# Patient Record
Sex: Male | Born: 1935 | Race: Black or African American | Hispanic: No | Marital: Married | Smoking: Former smoker
Health system: Southern US, Community
[De-identification: ages and names within clinical notes are randomized; demographics above are authoritative.]

## PROBLEM LIST (undated history)

## (undated) DIAGNOSIS — G7000 Myasthenia gravis without (acute) exacerbation: Principal | ICD-10-CM

## (undated) DIAGNOSIS — M1990 Unspecified osteoarthritis, unspecified site: Secondary | ICD-10-CM

## (undated) DIAGNOSIS — I700 Atherosclerosis of aorta: Secondary | ICD-10-CM

## (undated) DIAGNOSIS — N1330 Unspecified hydronephrosis: Secondary | ICD-10-CM

## (undated) DIAGNOSIS — N182 Chronic kidney disease, stage 2 (mild): Secondary | ICD-10-CM

## (undated) DIAGNOSIS — R079 Chest pain, unspecified: Secondary | ICD-10-CM

## (undated) DIAGNOSIS — I723 Aneurysm of iliac artery: Secondary | ICD-10-CM

## (undated) DIAGNOSIS — R972 Elevated prostate specific antigen [PSA]: Secondary | ICD-10-CM

## (undated) DIAGNOSIS — I2699 Other pulmonary embolism without acute cor pulmonale: Secondary | ICD-10-CM

## (undated) DIAGNOSIS — K219 Gastro-esophageal reflux disease without esophagitis: Secondary | ICD-10-CM

## (undated) DIAGNOSIS — I10 Essential (primary) hypertension: Secondary | ICD-10-CM

## (undated) DIAGNOSIS — E119 Type 2 diabetes mellitus without complications: Secondary | ICD-10-CM

## (undated) DIAGNOSIS — I2510 Atherosclerotic heart disease of native coronary artery without angina pectoris: Secondary | ICD-10-CM

## (undated) DIAGNOSIS — N200 Calculus of kidney: Secondary | ICD-10-CM

## (undated) DIAGNOSIS — E785 Hyperlipidemia, unspecified: Secondary | ICD-10-CM

## (undated) HISTORY — DX: Hyperlipidemia, unspecified: E78.5

## (undated) HISTORY — PX: PROSTATE BIOPSY: SHX241

## (undated) HISTORY — DX: Type 2 diabetes mellitus without complications: E11.9

## (undated) HISTORY — DX: Unspecified hydronephrosis: N13.30

## (undated) HISTORY — DX: Myasthenia gravis without (acute) exacerbation: G70.00

## (undated) HISTORY — PX: KNEE SURGERY: SHX244

## (undated) HISTORY — DX: Chronic kidney disease, stage 2 (mild): N18.2

## (undated) HISTORY — DX: Unspecified osteoarthritis, unspecified site: M19.90

## (undated) HISTORY — PX: LITHOTRIPSY: SUR834

## (undated) HISTORY — PX: PROSTATE SURGERY: SHX751

---

## 2006-03-25 ENCOUNTER — Ambulatory Visit: Payer: Self-pay | Admitting: Internal Medicine

## 2006-04-09 ENCOUNTER — Encounter (INDEPENDENT_AMBULATORY_CARE_PROVIDER_SITE_OTHER): Payer: Self-pay | Admitting: *Deleted

## 2006-04-09 ENCOUNTER — Ambulatory Visit: Payer: Self-pay | Admitting: Internal Medicine

## 2006-12-06 ENCOUNTER — Encounter: Admission: RE | Admit: 2006-12-06 | Discharge: 2006-12-06 | Payer: Self-pay | Admitting: Internal Medicine

## 2007-04-30 ENCOUNTER — Emergency Department (HOSPITAL_COMMUNITY): Admission: EM | Admit: 2007-04-30 | Discharge: 2007-04-30 | Payer: Self-pay | Admitting: Emergency Medicine

## 2007-05-09 ENCOUNTER — Inpatient Hospital Stay (HOSPITAL_COMMUNITY): Admission: RE | Admit: 2007-05-09 | Discharge: 2007-05-11 | Payer: Self-pay | Admitting: Orthopedic Surgery

## 2007-06-01 ENCOUNTER — Ambulatory Visit (HOSPITAL_COMMUNITY): Admission: RE | Admit: 2007-06-01 | Discharge: 2007-06-01 | Payer: Self-pay | Admitting: Orthopedic Surgery

## 2007-06-10 ENCOUNTER — Encounter: Admission: RE | Admit: 2007-06-10 | Discharge: 2007-06-10 | Payer: Self-pay | Admitting: Orthopedic Surgery

## 2007-09-03 ENCOUNTER — Encounter: Admission: RE | Admit: 2007-09-03 | Discharge: 2007-09-03 | Payer: Self-pay | Admitting: Internal Medicine

## 2007-10-18 ENCOUNTER — Encounter: Admission: RE | Admit: 2007-10-18 | Discharge: 2007-10-18 | Payer: Self-pay | Admitting: *Deleted

## 2007-12-25 DIAGNOSIS — H499 Unspecified paralytic strabismus: Secondary | ICD-10-CM

## 2007-12-25 HISTORY — DX: Unspecified paralytic strabismus: H49.9

## 2008-03-24 ENCOUNTER — Emergency Department (HOSPITAL_COMMUNITY): Admission: EM | Admit: 2008-03-24 | Discharge: 2008-03-24 | Payer: Self-pay | Admitting: Emergency Medicine

## 2008-04-19 ENCOUNTER — Ambulatory Visit (HOSPITAL_COMMUNITY): Admission: RE | Admit: 2008-04-19 | Discharge: 2008-04-19 | Payer: Self-pay | Admitting: Urology

## 2009-03-15 ENCOUNTER — Encounter (INDEPENDENT_AMBULATORY_CARE_PROVIDER_SITE_OTHER): Payer: Self-pay | Admitting: *Deleted

## 2009-04-17 ENCOUNTER — Emergency Department (HOSPITAL_COMMUNITY): Admission: EM | Admit: 2009-04-17 | Discharge: 2009-04-17 | Payer: Self-pay | Admitting: Emergency Medicine

## 2009-05-20 ENCOUNTER — Encounter: Admission: RE | Admit: 2009-05-20 | Discharge: 2009-05-20 | Payer: Self-pay | Admitting: Orthopedic Surgery

## 2009-08-04 ENCOUNTER — Ambulatory Visit: Payer: Self-pay | Admitting: Internal Medicine

## 2009-08-16 ENCOUNTER — Encounter: Payer: Self-pay | Admitting: Internal Medicine

## 2009-08-16 ENCOUNTER — Ambulatory Visit: Payer: Self-pay | Admitting: Internal Medicine

## 2009-08-18 ENCOUNTER — Encounter: Payer: Self-pay | Admitting: Internal Medicine

## 2011-04-08 ENCOUNTER — Emergency Department (HOSPITAL_COMMUNITY): Payer: Medicare Other

## 2011-04-08 ENCOUNTER — Emergency Department (HOSPITAL_COMMUNITY)
Admission: EM | Admit: 2011-04-08 | Discharge: 2011-04-08 | Disposition: A | Discharge status: Home / Self Care | Payer: Medicare Other | Attending: Emergency Medicine | Admitting: Emergency Medicine

## 2011-04-08 DIAGNOSIS — R110 Nausea: Secondary | ICD-10-CM | POA: Insufficient documentation

## 2011-04-08 DIAGNOSIS — Z79899 Other long term (current) drug therapy: Secondary | ICD-10-CM | POA: Insufficient documentation

## 2011-04-08 DIAGNOSIS — Z7982 Long term (current) use of aspirin: Secondary | ICD-10-CM | POA: Insufficient documentation

## 2011-04-08 DIAGNOSIS — N201 Calculus of ureter: Secondary | ICD-10-CM | POA: Insufficient documentation

## 2011-04-08 DIAGNOSIS — E119 Type 2 diabetes mellitus without complications: Secondary | ICD-10-CM | POA: Insufficient documentation

## 2011-04-08 DIAGNOSIS — N200 Calculus of kidney: Secondary | ICD-10-CM | POA: Insufficient documentation

## 2011-04-08 DIAGNOSIS — R109 Unspecified abdominal pain: Secondary | ICD-10-CM | POA: Insufficient documentation

## 2011-04-08 LAB — CBC
HCT: 39.2 % (ref 39.0–52.0)
WBC: 8 10*3/uL (ref 4.0–10.5)

## 2011-04-08 LAB — DIFFERENTIAL
Basophils Absolute: 0 10*3/uL (ref 0.0–0.1)
Eosinophils Absolute: 0.1 10*3/uL (ref 0.0–0.7)
Eosinophils Relative: 1 % (ref 0–5)
Lymphs Abs: 0.9 10*3/uL (ref 0.7–4.0)
Monocytes Relative: 7 % (ref 3–12)
Neutrophils Relative %: 81 % — ABNORMAL HIGH (ref 43–77)

## 2011-04-08 LAB — URINE MICROSCOPIC-ADD ON

## 2011-04-08 LAB — BASIC METABOLIC PANEL
BUN: 8 mg/dL (ref 6–23)
Glucose, Bld: 161 mg/dL — ABNORMAL HIGH (ref 70–99)
Potassium: 4.2 mEq/L (ref 3.5–5.1)
Sodium: 141 mEq/L (ref 135–145)

## 2011-04-08 LAB — URINALYSIS, ROUTINE W REFLEX MICROSCOPIC
Glucose, UA: NEGATIVE mg/dL
Ketones, ur: NEGATIVE mg/dL
Protein, ur: 100 mg/dL — AB
Specific Gravity, Urine: 1.02 (ref 1.005–1.030)

## 2011-04-30 ENCOUNTER — Other Ambulatory Visit: Payer: Self-pay | Admitting: Urology

## 2011-04-30 ENCOUNTER — Ambulatory Visit
Admission: RE | Admit: 2011-04-30 | Discharge: 2011-04-30 | Disposition: A | Discharge status: Home / Self Care | Payer: Medicare Other | Source: Ambulatory Visit | Attending: Urology | Admitting: Urology

## 2011-04-30 DIAGNOSIS — N201 Calculus of ureter: Secondary | ICD-10-CM

## 2011-04-30 LAB — BASIC METABOLIC PANEL
CO2: 36 mEq/L — ABNORMAL HIGH (ref 19–32)
Chloride: 100 mEq/L (ref 96–112)
GFR calc Af Amer: >60 mL/min (ref >60)
Potassium: 4.4 mEq/L (ref 3.5–5.1)

## 2011-04-30 LAB — CBC
Hemoglobin: 12 g/dL — ABNORMAL LOW (ref 13.0–17.0)
MCV: 91 fL (ref 78.0–100.0)
Platelets: 274 10*3/uL (ref 150–400)
RBC: 4 MIL/uL — ABNORMAL LOW (ref 4.22–5.81)
WBC: 4 10*3/uL (ref 4.0–10.5)

## 2011-05-01 ENCOUNTER — Ambulatory Visit (HOSPITAL_BASED_OUTPATIENT_CLINIC_OR_DEPARTMENT_OTHER)
Admission: RE | Admit: 2011-05-01 | Discharge: 2011-05-01 | Disposition: A | Discharge status: Home / Self Care | Payer: Medicare Other | Source: Ambulatory Visit | Attending: Urology | Admitting: Urology

## 2011-05-01 DIAGNOSIS — Z79899 Other long term (current) drug therapy: Secondary | ICD-10-CM | POA: Insufficient documentation

## 2011-05-01 DIAGNOSIS — R9431 Abnormal electrocardiogram [ECG] [EKG]: Secondary | ICD-10-CM | POA: Insufficient documentation

## 2011-05-01 DIAGNOSIS — R7309 Other abnormal glucose: Secondary | ICD-10-CM | POA: Insufficient documentation

## 2011-05-01 DIAGNOSIS — R109 Unspecified abdominal pain: Secondary | ICD-10-CM | POA: Insufficient documentation

## 2011-05-01 DIAGNOSIS — I10 Essential (primary) hypertension: Secondary | ICD-10-CM | POA: Insufficient documentation

## 2011-05-01 DIAGNOSIS — N201 Calculus of ureter: Secondary | ICD-10-CM | POA: Insufficient documentation

## 2011-05-01 LAB — GLUCOSE, CAPILLARY: Glucose-Capillary: 109 mg/dL — ABNORMAL HIGH (ref 70–99)

## 2011-05-08 NOTE — Op Note (Signed)
NAMEMAKEL, MCMANN NO.:  [id number]   MEDICAL RECORD NO.:  [id number]          PATIENT TYPE:  INP   LOCATION:  1604                         FACILITY:  [facility]   PHYSICIAN:  Georges Lynch. Gioffre, M.D.DATE OF BIRTH:  10-Jan-1932   DATE OF PROCEDURE:  05/09/2007  DATE OF DISCHARGE:                               OPERATIVE REPORT   SURGEON:  Georges Lynch. Darrelyn Hillock, M.D.   ASSISTANT:  Arlyn Leak, P.A.   PREOPERATIVE DIAGNOSES:  1. Comminuted fracture of the right patella.  2. Questionable complete disruption of the patellar tendon.   POSTOPERATIVE DIAGNOSIS:  Comminuted fracture of the right patella.   OPERATION:  1. Open reduction and internal screw fixation of a comminuted fracture      of the right patella.  2. Restore tendon graft to the right patellar tendon.   PROCEDURE:  Under general anesthesia, routine orthopedic prep and  draping of the right lower extremity was carried out.  The patient had 1  g of IV Ancef.  At this time, the leg was exsanguinated with an Esmarch  and tourniquet was elevated at 375 mmHg.  An incision was made over the  anterior aspect of the right knee, bleeders identified and cauterized.  At this time, I then went down and examined the tendon.  The tendon was  abraded, but not ruptured.  He had multiple fractures.  I checked the  fracture site and I felt at this time the best thing to do to stabilize  him would be to do a screw fixation so I utilized a guidepin up through  the patella across the fracture sites and then utilized a fully threaded  screw for fixation after making my initial drill hole with a 3.8-mm  drill bit.  I utilized a 4.5-mm titanium DePuy screw with a washer for  fixation.  We had a solid fixation.  Fracture site was thoroughly  compressed and an x-ray was taken with the C-arm that showed good  position of the fracture fragments and the screw and washer.  We  irrigated the area out.  I injected 20 mL of 0.5% Marcaine  with  epinephrine in the knee joint and we then utilized a large Restore  tendon graft to reinforce the patellar tendon.  The wound then was  closed in layers in usual fashion.  Sterile dressings were applied.  The  patient was placed in a knee immobilizer.           ______________________________  Georges Lynch Darrelyn Hillock, M.D.     RAG/MEDQ  D:  05/09/2007  T:  05/10/2007  Job:  [id number]

## 2011-05-08 NOTE — H&P (Signed)
[name], [name] NO.:  [id number]   MEDICAL RECORD NO.:  [id number]         PATIENT TYPE:  LINP   LOCATION:                                FACILITY:  [facility]   PHYSICIAN:  Thomas Lynch. Gillespie, M.D.DATE OF BIRTH:  1932-04-29   DATE OF ADMISSION:  DATE OF DISCHARGE:  05/11/2007                              HISTORY & PHYSICAL   CHIEF COMPLAINT:  Right leg pain.   HISTORY OF PRESENT ILLNESS:  The patient is a 77 year old gentleman who  was at work on Apr 30, 2007, when he stumbled and tripped, fell forward,  grabbed something with his right arm, hyperextend his right arm, falling  and landing on his knee, directly on a large pipe, hyper-extending the  knee causing injury.  He had quite a bit of swelling in the knee.  He  went to the emergency room for evaluation.  X-rays of his shoulder were  normal.  X-rays of his hip were normal.  X-rays of his knee showed  multiple part comminuted fracture of his patella.  The patient also had  a blastic-appearing lesion in his right lower facet joint, which  recommended further evaluation.  The patient is here today for  evaluation of his right knee.   On review of the patient's current situation.  He has a multi-part  fractured patella and Dr. Darrelyn Gillespie recommends that we surgically do a  partial patellectomy with reattachment of the patella tendon for this  issue.  The patient would like to proceed.   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   CURRENT MEDICATIONS:  1. Prinivil 10 mg a day.  2. Nexium p.r.n.  3. Claritin-D p.r.n.   ALLERGIES:  1. ASPIRIN.  2. IV DYE.   PAST SURGICAL HISTORY:  Dilatation of prostate canal.  The patient  denies any complications.   SOCIAL HISTORY:  The patient is married, lives with his wife.  He  currently works at [employer].  He does not smoke or use alcohol.   PRIMARY CARE PHYSICIAN:  [name], M.D.   REVIEW OF SYSTEMS:  Negative for any recent fevers,  chills, colds, or  any systemic complaints.  He denies any cardiac issues, no respiratory  issues, no GI/GU issues.   PHYSICAL EXAMINATION:  GENERAL:  The patient is a healthy-appearing  gentleman conscious, alert, and appropriate.  VITAL SIGNS:  He is 5 feet 11 inches, 195 pounds.  The patient's blood  pressure is 160/82, pulse is 70 and regular. The patient is afebrile.  HEENT:  Head is normocephalic.  Pupils equal, round, reactive.  Gross  hearing is intact.  NECK:  Supple.  He had good range of motion of the cervical spine  without any difficulty.  CHEST:  Lung sounds were clear and equal bilaterally.  No wheezes,  rales, rhonchi.  HEART:  Regular rate and rhythm.  No murmurs, rubs or gallops.  ABDOMEN:  Soft, nontender.  Bowel sounds present.  EXTREMITIES:  Upper extremities:  He had good range of motion of both  shoulders today without any difficulty, good range of his  elbows and  wrists.  Lower Extremities:  Right and left hips had full extension,  flexion up to the 120 degrees.  Right knee had significant effusion and  ecchymosis.  He was unable to straight leg raise due to discomfort.  His  distal foot is neurologically intact.  His left lower extremity is  normal-appearing.  BREAST/RECTAL/GU:  Deferred at this time.   IMPRESSION:  1. Comminuted fracture of right patella.  2. Borderline diabetes   PLAN:  The patient will undergo all routine labs and tests prior to  having a right knee partial patellectomy with reattachment of his  patella tendon.  We did contact Dr. Dierdre Gillespie office for  preoperative clearance and he indicated that the patient was fine to  proceed with this surgical procedure.      [name], P.A.    ______________________________  [name], M.D.    RWK/MEDQ  D:  05/01/2007  T:  05/01/2007  Job:  [id number]

## 2011-05-10 NOTE — Op Note (Signed)
[name], [name] NO.:  [id number]  MEDICAL RECORD NO.:  [id number]           PATIENT TYPE:  LOCATION:                                 FACILITY:  PHYSICIAN:  Courtney Paris, M.D.DATE OF BIRTH:  Jan 28, 1932  DATE OF PROCEDURE:  05/01/2011 DATE OF DISCHARGE:                              OPERATIVE REPORT   PREOPERATIVE DIAGNOSIS:  Obstructing left distal ureteral stone.  POSTOPERATIVE DIAGNOSIS:  Obstructing left distal ureteral stone.  OPERATION: 1. Cystoscopy. 2. Retrograde pyelogram, left. 3. Left ureteroscopy with holmium lasertripsy. 4. Insertion of left ureteral stent.  ANESTHESIA:  General.  SURGEON:  Courtney Paris, M.D.  BRIEF HISTORY:  This 77 year old black male was admitted with a left distal stone that was first discovered on a CT scan when he went to the emergency room on April 08, 2011, with acute left-sided pain.  He had had previous stone treated by lithotripsy by Dr. Earlene Plater in 2009.  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  He enters now for removal of his obstructing stone by ureteroscopy.  DESCRIPTION OF PROCEDURE:  The patient was placed on the operating table in dorsal lithotomy position.  After satisfactory induction of general anesthesia, was given IV Cipro, prepped and draped with Betadine in the usual sterile fashion.  Time-out was then performed and the patient and procedure then reconfirmed.  A 21 panendoscope was inserted.  The anterior urethra was normal.  Posterior urethra showed mild trilobar BPH and the bladder was entered.  The bladder was 2+ trabeculated, but had no bladder mucosal lesions.  The orifices were little difficult to see due to the large intravesical protrusion of the median lobe.  With a guidewire and the 5 open-ended ureteral catheter, I was able to catheterize the left ureteral orifice, removed the guidewire, then performed an occlusive retrograde pyelogram.  This demonstrated the stone  in the distal ureter with only mild dilation proximal to this. Guidewire was then passed under fluoroscopy up to the level of the kidney and the open-ended catheter was then removed.  The scope was removed and under fluoroscopy using the ureteral access sheath and cannula, I dilated the distal ureter up to the stone leaving the guidewire in place.  The 6 short ureteroscope was then passed under direct vision up to the stone, which was quite large and photographed.  The holmium laser was then calibrated at 0.5 J at 10 per second repetition and under direct vision, I was able to break the stone into about 3 to 4 pieces.  I was able to get the scope past this.  It took about 3 to 4 passes with the loop basket to remove all the pieces, but when these were gone x-ray confirmed there were no other stone fragments.  Over the guidewire, a 6- French x 26 cm length double-J ureteral stent was passed over the guidewire leaving the string on the distal end.  This was brought out through the penis and taped to the penis for later removal. The patient was given some Uro-jet lidocaine jelly anesthesia in the urethra and a B and O suppository.  He was then taken to the recovery room in good condition and will be later discharged as an outpatient. We will remove the stent in 2 days.     Courtney Paris, M.D.     HMK/MEDQ  D:  05/01/2011  T:  05/01/2011  Job:  [id number]  Electronically Signed by Vic Blackbird M.D. on 05/10/2011 05:20:28 PM

## 2011-09-18 LAB — DIFFERENTIAL
Basophils Relative: 1
Eosinophils Absolute: 0.1
Eosinophils Relative: 1
Monocytes Absolute: 0.3
Monocytes Relative: 4

## 2011-09-18 LAB — URINALYSIS, ROUTINE W REFLEX MICROSCOPIC
Bilirubin Urine: NEGATIVE
Glucose, UA: NEGATIVE
Ketones, ur: 15 — AB
Protein, ur: 100 — AB
Urobilinogen, UA: 1

## 2011-09-18 LAB — CBC
Hemoglobin: 13
Platelets: 247
RDW: 14.7

## 2011-09-18 LAB — COMPREHENSIVE METABOLIC PANEL
ALT: 15
AST: 21
Albumin: 3.8
Alkaline Phosphatase: 85
GFR calc Af Amer: 60 — ABNORMAL LOW
Glucose, Bld: 124 — ABNORMAL HIGH
Potassium: 3.6
Sodium: 138
Total Protein: 7

## 2011-09-18 LAB — URINE MICROSCOPIC-ADD ON

## 2012-02-28 DIAGNOSIS — G733 Myasthenic syndromes in other diseases classified elsewhere: Secondary | ICD-10-CM | POA: Diagnosis not present

## 2012-02-28 DIAGNOSIS — H532 Diplopia: Secondary | ICD-10-CM | POA: Diagnosis not present

## 2012-03-13 DIAGNOSIS — E119 Type 2 diabetes mellitus without complications: Secondary | ICD-10-CM | POA: Diagnosis not present

## 2012-03-13 DIAGNOSIS — E785 Hyperlipidemia, unspecified: Secondary | ICD-10-CM | POA: Diagnosis not present

## 2012-03-13 DIAGNOSIS — I10 Essential (primary) hypertension: Secondary | ICD-10-CM | POA: Diagnosis not present

## 2012-03-13 DIAGNOSIS — Z125 Encounter for screening for malignant neoplasm of prostate: Secondary | ICD-10-CM | POA: Diagnosis not present

## 2012-03-20 DIAGNOSIS — Z125 Encounter for screening for malignant neoplasm of prostate: Secondary | ICD-10-CM | POA: Diagnosis not present

## 2012-03-20 DIAGNOSIS — I10 Essential (primary) hypertension: Secondary | ICD-10-CM | POA: Diagnosis not present

## 2012-03-20 DIAGNOSIS — E119 Type 2 diabetes mellitus without complications: Secondary | ICD-10-CM | POA: Diagnosis not present

## 2012-03-20 DIAGNOSIS — E785 Hyperlipidemia, unspecified: Secondary | ICD-10-CM | POA: Diagnosis not present

## 2012-03-20 DIAGNOSIS — Z0000 Encounter for general adult medical examination without abnormal findings: Secondary | ICD-10-CM | POA: Diagnosis not present

## 2012-03-27 DIAGNOSIS — Z1212 Encounter for screening for malignant neoplasm of rectum: Secondary | ICD-10-CM | POA: Diagnosis not present

## 2012-06-02 DIAGNOSIS — N400 Enlarged prostate without lower urinary tract symptoms: Secondary | ICD-10-CM | POA: Diagnosis not present

## 2012-07-31 DIAGNOSIS — M1990 Unspecified osteoarthritis, unspecified site: Secondary | ICD-10-CM | POA: Diagnosis not present

## 2012-07-31 DIAGNOSIS — E119 Type 2 diabetes mellitus without complications: Secondary | ICD-10-CM | POA: Diagnosis not present

## 2012-07-31 DIAGNOSIS — I10 Essential (primary) hypertension: Secondary | ICD-10-CM | POA: Diagnosis not present

## 2012-07-31 DIAGNOSIS — E785 Hyperlipidemia, unspecified: Secondary | ICD-10-CM | POA: Diagnosis not present

## 2012-09-26 DIAGNOSIS — Z23 Encounter for immunization: Secondary | ICD-10-CM | POA: Diagnosis not present

## 2012-09-26 DIAGNOSIS — J069 Acute upper respiratory infection, unspecified: Secondary | ICD-10-CM | POA: Diagnosis not present

## 2012-09-26 DIAGNOSIS — I10 Essential (primary) hypertension: Secondary | ICD-10-CM | POA: Diagnosis not present

## 2012-09-26 DIAGNOSIS — E119 Type 2 diabetes mellitus without complications: Secondary | ICD-10-CM | POA: Diagnosis not present

## 2012-12-04 DIAGNOSIS — E785 Hyperlipidemia, unspecified: Secondary | ICD-10-CM | POA: Diagnosis not present

## 2012-12-04 DIAGNOSIS — Z1331 Encounter for screening for depression: Secondary | ICD-10-CM | POA: Diagnosis not present

## 2012-12-04 DIAGNOSIS — E119 Type 2 diabetes mellitus without complications: Secondary | ICD-10-CM | POA: Diagnosis not present

## 2012-12-04 DIAGNOSIS — I10 Essential (primary) hypertension: Secondary | ICD-10-CM | POA: Diagnosis not present

## 2013-04-09 DIAGNOSIS — E1129 Type 2 diabetes mellitus with other diabetic kidney complication: Secondary | ICD-10-CM | POA: Diagnosis not present

## 2013-04-09 DIAGNOSIS — Z125 Encounter for screening for malignant neoplasm of prostate: Secondary | ICD-10-CM | POA: Diagnosis not present

## 2013-04-09 DIAGNOSIS — I10 Essential (primary) hypertension: Secondary | ICD-10-CM | POA: Diagnosis not present

## 2013-04-09 DIAGNOSIS — E785 Hyperlipidemia, unspecified: Secondary | ICD-10-CM | POA: Diagnosis not present

## 2013-04-15 DIAGNOSIS — M1990 Unspecified osteoarthritis, unspecified site: Secondary | ICD-10-CM | POA: Diagnosis not present

## 2013-04-15 DIAGNOSIS — Z0000 Encounter for general adult medical examination without abnormal findings: Secondary | ICD-10-CM | POA: Diagnosis not present

## 2013-04-15 DIAGNOSIS — I10 Essential (primary) hypertension: Secondary | ICD-10-CM | POA: Diagnosis not present

## 2013-04-15 DIAGNOSIS — Z6828 Body mass index (BMI) 28.0-28.9, adult: Secondary | ICD-10-CM | POA: Diagnosis not present

## 2013-04-15 DIAGNOSIS — Z125 Encounter for screening for malignant neoplasm of prostate: Secondary | ICD-10-CM | POA: Diagnosis not present

## 2013-04-15 DIAGNOSIS — E1129 Type 2 diabetes mellitus with other diabetic kidney complication: Secondary | ICD-10-CM | POA: Diagnosis not present

## 2013-04-15 DIAGNOSIS — E785 Hyperlipidemia, unspecified: Secondary | ICD-10-CM | POA: Diagnosis not present

## 2013-04-15 DIAGNOSIS — N182 Chronic kidney disease, stage 2 (mild): Secondary | ICD-10-CM | POA: Diagnosis not present

## 2013-04-17 DIAGNOSIS — Z1212 Encounter for screening for malignant neoplasm of rectum: Secondary | ICD-10-CM | POA: Diagnosis not present

## 2013-05-05 DIAGNOSIS — L723 Sebaceous cyst: Secondary | ICD-10-CM | POA: Diagnosis not present

## 2013-08-16 ENCOUNTER — Emergency Department (HOSPITAL_COMMUNITY): Payer: Medicare Other

## 2013-08-16 ENCOUNTER — Emergency Department (HOSPITAL_COMMUNITY)
Admission: EM | Admit: 2013-08-16 | Discharge: 2013-08-16 | Disposition: A | Discharge status: Home / Self Care | Payer: Medicare Other | Attending: Emergency Medicine | Admitting: Emergency Medicine

## 2013-08-16 ENCOUNTER — Encounter (HOSPITAL_COMMUNITY): Payer: Self-pay | Admitting: *Deleted

## 2013-08-16 DIAGNOSIS — R52 Pain, unspecified: Secondary | ICD-10-CM | POA: Insufficient documentation

## 2013-08-16 DIAGNOSIS — Z87442 Personal history of urinary calculi: Secondary | ICD-10-CM | POA: Insufficient documentation

## 2013-08-16 DIAGNOSIS — Z87891 Personal history of nicotine dependence: Secondary | ICD-10-CM | POA: Diagnosis not present

## 2013-08-16 DIAGNOSIS — Z79899 Other long term (current) drug therapy: Secondary | ICD-10-CM | POA: Diagnosis not present

## 2013-08-16 DIAGNOSIS — M47817 Spondylosis without myelopathy or radiculopathy, lumbosacral region: Secondary | ICD-10-CM | POA: Diagnosis not present

## 2013-08-16 DIAGNOSIS — M25559 Pain in unspecified hip: Secondary | ICD-10-CM | POA: Diagnosis not present

## 2013-08-16 DIAGNOSIS — Z7982 Long term (current) use of aspirin: Secondary | ICD-10-CM | POA: Diagnosis not present

## 2013-08-16 DIAGNOSIS — M169 Osteoarthritis of hip, unspecified: Secondary | ICD-10-CM | POA: Diagnosis not present

## 2013-08-16 DIAGNOSIS — M5126 Other intervertebral disc displacement, lumbar region: Secondary | ICD-10-CM | POA: Diagnosis not present

## 2013-08-16 DIAGNOSIS — I10 Essential (primary) hypertension: Secondary | ICD-10-CM | POA: Insufficient documentation

## 2013-08-16 DIAGNOSIS — M25552 Pain in left hip: Secondary | ICD-10-CM

## 2013-08-16 DIAGNOSIS — R209 Unspecified disturbances of skin sensation: Secondary | ICD-10-CM | POA: Insufficient documentation

## 2013-08-16 HISTORY — DX: Calculus of kidney: N20.0

## 2013-08-16 HISTORY — DX: Essential (primary) hypertension: I10

## 2013-08-16 MED ORDER — TRAMADOL HCL 50 MG PO TABS: 50.0000 mg | ORAL_TABLET | Freq: Four times a day (QID) | ORAL | Status: DC | PRN

## 2013-08-16 NOTE — ED Notes (Signed)
Pt states that his left hip has been causing him pain for the last few months. Pt states that he usually have intermittant pain and it usually goes away but this time the pain in his left hip has lasted 3 days. Pt states feels like bone on bone pain, sharp. Pt ambulatory.

## 2013-08-16 NOTE — ED Provider Notes (Signed)
CSN: [id number]     Arrival date & time 08/16/13  1944 History  This chart was scribed for non-physician practitioner Antony Madura, PA-C working with Shanna Cisco, MD by Danella Maiers, ED Scribe. This patient was seen in room TR05C/TR05C and the patient's care was started at 9:18 PM.    Chief Complaint  Patient presents with  . Hip Pain    Patient is a 79 y.o. male presenting with hip pain. The history is provided by the patient. No language interpreter was used.  Hip Pain This is a new problem. The current episode started more than 1 week ago. The problem occurs daily. The problem has been gradually worsening. Exacerbated by: prolonged rest. The symptoms are relieved by narcotics.   HPI Comments: [name] is a 79 y.o. male who presents to the Emergency Department complaining of sharp, constant, left sided hip pain that radiates down the back of his left leg onset two weeks ago. Pt reports the pain is worse after prolonged periods of rest, especially upon waking in the morning. He states the pain is less during the day while he is moving around. Pt denies recently falling or injuring his hip.  He has felt associated tingling in his left foot. He has tried one oxycodone leftover from a knee surgery that helped the pain and he was able to sleep. He denies numbness in his legs, weakness, trouble ambulating, numbness of the genitals, bladder and bowel incontinence.    Past Medical History  Diagnosis Date  . Kidney stone   . Hypertension    Past Surgical History  Procedure Laterality Date  . Prostate surgery    . Knee surgery     History reviewed. No pertinent family history. History  Substance Use Topics  . Smoking status: Former Games developer  . Smokeless tobacco: Not on file  . Alcohol Use: No    Review of Systems  Constitutional: Negative for fever.  Musculoskeletal: Positive for arthralgias (left hip pain). Negative for gait problem.  Skin: Negative for pallor.  Neurological:  Negative for weakness and numbness.  All other systems reviewed and are negative.    Allergies  Aspirin  Home Medications   Current Outpatient Rx  Name  Route  Sig  Dispense  Refill  . aspirin EC 81 MG tablet   Oral   Take 81 mg by mouth daily.         Marland Kitchen lisinopril (PRINIVIL,ZESTRIL) 10 MG tablet   Oral   Take 10 mg by mouth daily.         . pravastatin (PRAVACHOL) 20 MG tablet   Oral   Take 20 mg by mouth daily.         . traMADol (ULTRAM) 50 MG tablet   Oral   Take 1 tablet (50 mg total) by mouth every 6 (six) hours as needed for pain.   15 tablet   0    BP 186/98  Pulse 91  Temp(Src) 99.9 F (37.7 C) (Oral)  Resp 16  SpO2 96% Physical Exam  Nursing note and vitals reviewed. Constitutional: He is oriented to person, place, and time. He appears well-developed and well-nourished. No distress.  HENT:  Head: Normocephalic and atraumatic.  Eyes: Conjunctivae and EOM are normal. No scleral icterus.  Neck: Normal range of motion.  Cardiovascular: Normal rate, regular rhythm and intact distal pulses.   Pulses:      Dorsalis pedis pulses are 2+ on the right side, and 2+ on the left side.  Posterior tibial pulses are 2+ on the right side, and 2+ on the left side.  TPN and PT pulses 2+. Capillary refill normal.  Pulmonary/Chest: Effort normal. No respiratory distress.  Musculoskeletal: Normal range of motion.  No pain with internal and external rotation of left hip joint. Range of motion of left hip joint normal. No bony tenderness.  Neurological: He is alert and oriented to person, place, and time.  DTRs normal and symmetric. No sensory or motor deficits appreciated. Patient ambulatory with normal gait.  Skin: Skin is warm and dry. No rash noted. He is not diaphoretic. No erythema. No pallor.  Psychiatric: He has a normal mood and affect. His behavior is normal.    ED Course  Medications - No data to display  DIAGNOSTIC STUDIES: Oxygen Saturation is  96% on room air, normal by my interpretation.    COORDINATION OF CARE: 9:29 PM- Discussed treatment plan with pt which includes a left hip x-ray and pt agrees to plan.  Procedures (including critical care time)  Labs Reviewed - No data to display Dg Lumbar Spine Complete  08/16/2013   *RADIOLOGY REPORT*  Clinical Data: Chronic hip pain  LUMBAR SPINE - COMPLETE 4+ VIEW  Comparison: 05/03/2011 abdominal radiograph  Findings: Mild multilevel degenerative changes of the lumbar spine including anterior osteophyte formation and facet arthropathy, most pronounced within the lower lumbar/lumbosacral junction. Minimal anterior wedging of L1, similar to prior.  Rounded calcifications within the left paramidline may reflect calcified lymph nodes. Overlying soft tissues otherwise unremarkable.  IMPRESSION: Mild multilevel lumbar spine degenerative change.   Original Report Authenticated By: Jearld Lesch, M.D.   Dg Hip Complete Left  08/16/2013   *RADIOLOGY REPORT*  Clinical Data: Chronic left hip pain  LEFT HIP - COMPLETE 2+ VIEW  Comparison: 12/06/2006  Findings: No displaced fracture.  No dislocation.  No aggressive osseous lesions. Mild bilateral hip degenerative changes. Overlying soft tissues unremarkable.  IMPRESSION: No acute osseous finding left hip.  Mild bilateral hip DJD.   Original Report Authenticated By: Jearld Lesch, M.D.   1. Hip pain, acute, left    MDM  79 year old male who presents for left hip pain. Patient is neurovascularly intact and ambulatory. No sensory or motor deficits appreciated. Strength against resistance normal and left lower extremity. Patient denies falls, trauma, or injury to his left hip. DG lumbar spine and left hip without acute bony abnormalities or evidence of fracture. Symptoms are consistent with hip pain secondary to arthritic changes. Patient endorses establish relationship with orthopedist and is reliable for followup. He is appropriate for discharge with  prescription for tramadol to take as needed for severe pain. Aleve recommended for mild to moderate pain as well as ice to the affected area. Indications for ED return provided and patient agreeable to plan. Patient seen also by Dr. Toy Cookey who is in agreement with this workup, assessment, management plan, and patient's stability for discharge.  I personally performed the services described in this documentation, which was scribed in my presence. The recorded information has been reviewed and is accurate.     Antony Madura, PA-C 08/16/13 2304

## 2013-08-17 NOTE — ED Provider Notes (Signed)
Medical screening examination/treatment/procedure(s) were conducted as a shared visit with non-physician practitioner(s) and myself.  I personally evaluated the patient during the encounter  Pt is a 79 y.o. male with chronic L hip pain, worse in morning and after periods of immobilization.  No infectious s/sx.  Pt well appearing on exam, no ttp over hip, NVI distally.  XR c/w degenerative changes.  Pt will try Aleve for pain, will f/u with his orthopedist.    Shanna Cisco, MD 08/17/13 1153

## 2013-08-21 DIAGNOSIS — Z23 Encounter for immunization: Secondary | ICD-10-CM | POA: Diagnosis not present

## 2013-08-21 DIAGNOSIS — M1990 Unspecified osteoarthritis, unspecified site: Secondary | ICD-10-CM | POA: Diagnosis not present

## 2013-08-21 DIAGNOSIS — I10 Essential (primary) hypertension: Secondary | ICD-10-CM | POA: Diagnosis not present

## 2013-08-21 DIAGNOSIS — E1129 Type 2 diabetes mellitus with other diabetic kidney complication: Secondary | ICD-10-CM | POA: Diagnosis not present

## 2013-08-21 DIAGNOSIS — M25559 Pain in unspecified hip: Secondary | ICD-10-CM | POA: Diagnosis not present

## 2013-08-28 DIAGNOSIS — M25559 Pain in unspecified hip: Secondary | ICD-10-CM | POA: Diagnosis not present

## 2013-09-01 DIAGNOSIS — M25559 Pain in unspecified hip: Secondary | ICD-10-CM | POA: Diagnosis not present

## 2013-09-03 DIAGNOSIS — M25569 Pain in unspecified knee: Secondary | ICD-10-CM | POA: Diagnosis not present

## 2013-09-08 DIAGNOSIS — M25569 Pain in unspecified knee: Secondary | ICD-10-CM | POA: Diagnosis not present

## 2013-09-14 DIAGNOSIS — M25569 Pain in unspecified knee: Secondary | ICD-10-CM | POA: Diagnosis not present

## 2013-09-21 DIAGNOSIS — M25569 Pain in unspecified knee: Secondary | ICD-10-CM | POA: Diagnosis not present

## 2013-09-28 DIAGNOSIS — M5430 Sciatica, unspecified side: Secondary | ICD-10-CM | POA: Diagnosis not present

## 2013-09-28 DIAGNOSIS — M25559 Pain in unspecified hip: Secondary | ICD-10-CM | POA: Diagnosis not present

## 2014-01-04 DIAGNOSIS — M1990 Unspecified osteoarthritis, unspecified site: Secondary | ICD-10-CM | POA: Diagnosis not present

## 2014-01-04 DIAGNOSIS — Z1331 Encounter for screening for depression: Secondary | ICD-10-CM | POA: Diagnosis not present

## 2014-01-04 DIAGNOSIS — I10 Essential (primary) hypertension: Secondary | ICD-10-CM | POA: Diagnosis not present

## 2014-01-04 DIAGNOSIS — Z6827 Body mass index (BMI) 27.0-27.9, adult: Secondary | ICD-10-CM | POA: Diagnosis not present

## 2014-01-04 DIAGNOSIS — E1129 Type 2 diabetes mellitus with other diabetic kidney complication: Secondary | ICD-10-CM | POA: Diagnosis not present

## 2014-01-04 DIAGNOSIS — E785 Hyperlipidemia, unspecified: Secondary | ICD-10-CM | POA: Diagnosis not present

## 2014-04-27 DIAGNOSIS — E785 Hyperlipidemia, unspecified: Secondary | ICD-10-CM | POA: Diagnosis not present

## 2014-04-27 DIAGNOSIS — E1129 Type 2 diabetes mellitus with other diabetic kidney complication: Secondary | ICD-10-CM | POA: Diagnosis not present

## 2014-04-27 DIAGNOSIS — I10 Essential (primary) hypertension: Secondary | ICD-10-CM | POA: Diagnosis not present

## 2014-04-27 DIAGNOSIS — Z125 Encounter for screening for malignant neoplasm of prostate: Secondary | ICD-10-CM | POA: Diagnosis not present

## 2014-05-05 DIAGNOSIS — E785 Hyperlipidemia, unspecified: Secondary | ICD-10-CM | POA: Diagnosis not present

## 2014-05-05 DIAGNOSIS — I10 Essential (primary) hypertension: Secondary | ICD-10-CM | POA: Diagnosis not present

## 2014-05-05 DIAGNOSIS — Z6828 Body mass index (BMI) 28.0-28.9, adult: Secondary | ICD-10-CM | POA: Diagnosis not present

## 2014-05-05 DIAGNOSIS — Z23 Encounter for immunization: Secondary | ICD-10-CM | POA: Diagnosis not present

## 2014-05-05 DIAGNOSIS — Z0000 Encounter for general adult medical examination without abnormal findings: Secondary | ICD-10-CM | POA: Diagnosis not present

## 2014-05-05 DIAGNOSIS — E1129 Type 2 diabetes mellitus with other diabetic kidney complication: Secondary | ICD-10-CM | POA: Diagnosis not present

## 2014-05-05 DIAGNOSIS — M1990 Unspecified osteoarthritis, unspecified site: Secondary | ICD-10-CM | POA: Diagnosis not present

## 2014-05-05 DIAGNOSIS — Z125 Encounter for screening for malignant neoplasm of prostate: Secondary | ICD-10-CM | POA: Diagnosis not present

## 2014-11-05 DIAGNOSIS — E785 Hyperlipidemia, unspecified: Secondary | ICD-10-CM | POA: Diagnosis not present

## 2014-11-05 DIAGNOSIS — Z6828 Body mass index (BMI) 28.0-28.9, adult: Secondary | ICD-10-CM | POA: Diagnosis not present

## 2014-11-05 DIAGNOSIS — I10 Essential (primary) hypertension: Secondary | ICD-10-CM | POA: Diagnosis not present

## 2014-11-05 DIAGNOSIS — E1129 Type 2 diabetes mellitus with other diabetic kidney complication: Secondary | ICD-10-CM | POA: Diagnosis not present

## 2014-11-05 DIAGNOSIS — Z23 Encounter for immunization: Secondary | ICD-10-CM | POA: Diagnosis not present

## 2014-12-08 DIAGNOSIS — Z6827 Body mass index (BMI) 27.0-27.9, adult: Secondary | ICD-10-CM | POA: Diagnosis not present

## 2014-12-08 DIAGNOSIS — E1129 Type 2 diabetes mellitus with other diabetic kidney complication: Secondary | ICD-10-CM | POA: Diagnosis not present

## 2015-05-04 DIAGNOSIS — E785 Hyperlipidemia, unspecified: Secondary | ICD-10-CM | POA: Diagnosis not present

## 2015-05-04 DIAGNOSIS — I10 Essential (primary) hypertension: Secondary | ICD-10-CM | POA: Diagnosis not present

## 2015-05-04 DIAGNOSIS — Z125 Encounter for screening for malignant neoplasm of prostate: Secondary | ICD-10-CM | POA: Diagnosis not present

## 2015-05-04 DIAGNOSIS — E1129 Type 2 diabetes mellitus with other diabetic kidney complication: Secondary | ICD-10-CM | POA: Diagnosis not present

## 2015-05-11 DIAGNOSIS — N182 Chronic kidney disease, stage 2 (mild): Secondary | ICD-10-CM | POA: Diagnosis not present

## 2015-05-11 DIAGNOSIS — Z0000 Encounter for general adult medical examination without abnormal findings: Secondary | ICD-10-CM | POA: Diagnosis not present

## 2015-05-11 DIAGNOSIS — Z6827 Body mass index (BMI) 27.0-27.9, adult: Secondary | ICD-10-CM | POA: Diagnosis not present

## 2015-05-11 DIAGNOSIS — E785 Hyperlipidemia, unspecified: Secondary | ICD-10-CM | POA: Diagnosis not present

## 2015-05-11 DIAGNOSIS — M1990 Unspecified osteoarthritis, unspecified site: Secondary | ICD-10-CM | POA: Diagnosis not present

## 2015-05-11 DIAGNOSIS — Z1389 Encounter for screening for other disorder: Secondary | ICD-10-CM | POA: Diagnosis not present

## 2015-05-11 DIAGNOSIS — E1129 Type 2 diabetes mellitus with other diabetic kidney complication: Secondary | ICD-10-CM | POA: Diagnosis not present

## 2015-05-11 DIAGNOSIS — I129 Hypertensive chronic kidney disease with stage 1 through stage 4 chronic kidney disease, or unspecified chronic kidney disease: Secondary | ICD-10-CM | POA: Diagnosis not present

## 2015-05-11 DIAGNOSIS — I10 Essential (primary) hypertension: Secondary | ICD-10-CM | POA: Diagnosis not present

## 2015-05-16 DIAGNOSIS — Z125 Encounter for screening for malignant neoplasm of prostate: Secondary | ICD-10-CM | POA: Diagnosis not present

## 2015-09-16 DIAGNOSIS — E1129 Type 2 diabetes mellitus with other diabetic kidney complication: Secondary | ICD-10-CM | POA: Diagnosis not present

## 2015-09-16 DIAGNOSIS — M1990 Unspecified osteoarthritis, unspecified site: Secondary | ICD-10-CM | POA: Diagnosis not present

## 2015-09-16 DIAGNOSIS — E1140 Type 2 diabetes mellitus with diabetic neuropathy, unspecified: Secondary | ICD-10-CM | POA: Diagnosis not present

## 2015-09-16 DIAGNOSIS — N182 Chronic kidney disease, stage 2 (mild): Secondary | ICD-10-CM | POA: Diagnosis not present

## 2015-09-16 DIAGNOSIS — I700 Atherosclerosis of aorta: Secondary | ICD-10-CM | POA: Diagnosis not present

## 2015-09-16 DIAGNOSIS — G609 Hereditary and idiopathic neuropathy, unspecified: Secondary | ICD-10-CM | POA: Diagnosis not present

## 2015-09-16 DIAGNOSIS — E1151 Type 2 diabetes mellitus with diabetic peripheral angiopathy without gangrene: Secondary | ICD-10-CM | POA: Diagnosis not present

## 2015-09-16 DIAGNOSIS — I129 Hypertensive chronic kidney disease with stage 1 through stage 4 chronic kidney disease, or unspecified chronic kidney disease: Secondary | ICD-10-CM | POA: Diagnosis not present

## 2015-09-16 DIAGNOSIS — Z6827 Body mass index (BMI) 27.0-27.9, adult: Secondary | ICD-10-CM | POA: Diagnosis not present

## 2015-09-16 DIAGNOSIS — Z23 Encounter for immunization: Secondary | ICD-10-CM | POA: Diagnosis not present

## 2015-09-16 DIAGNOSIS — I10 Essential (primary) hypertension: Secondary | ICD-10-CM | POA: Diagnosis not present

## 2015-09-16 DIAGNOSIS — E785 Hyperlipidemia, unspecified: Secondary | ICD-10-CM | POA: Diagnosis not present

## 2016-02-09 DIAGNOSIS — N182 Chronic kidney disease, stage 2 (mild): Secondary | ICD-10-CM | POA: Diagnosis not present

## 2016-02-09 DIAGNOSIS — G609 Hereditary and idiopathic neuropathy, unspecified: Secondary | ICD-10-CM | POA: Diagnosis not present

## 2016-02-09 DIAGNOSIS — Z1389 Encounter for screening for other disorder: Secondary | ICD-10-CM | POA: Diagnosis not present

## 2016-02-09 DIAGNOSIS — M1990 Unspecified osteoarthritis, unspecified site: Secondary | ICD-10-CM | POA: Diagnosis not present

## 2016-02-09 DIAGNOSIS — E1140 Type 2 diabetes mellitus with diabetic neuropathy, unspecified: Secondary | ICD-10-CM | POA: Diagnosis not present

## 2016-02-09 DIAGNOSIS — I129 Hypertensive chronic kidney disease with stage 1 through stage 4 chronic kidney disease, or unspecified chronic kidney disease: Secondary | ICD-10-CM | POA: Diagnosis not present

## 2016-02-09 DIAGNOSIS — E784 Other hyperlipidemia: Secondary | ICD-10-CM | POA: Diagnosis not present

## 2016-02-09 DIAGNOSIS — E1151 Type 2 diabetes mellitus with diabetic peripheral angiopathy without gangrene: Secondary | ICD-10-CM | POA: Diagnosis not present

## 2016-02-09 DIAGNOSIS — I10 Essential (primary) hypertension: Secondary | ICD-10-CM | POA: Diagnosis not present

## 2016-02-09 DIAGNOSIS — I700 Atherosclerosis of aorta: Secondary | ICD-10-CM | POA: Diagnosis not present

## 2016-05-16 DIAGNOSIS — E784 Other hyperlipidemia: Secondary | ICD-10-CM | POA: Diagnosis not present

## 2016-05-16 DIAGNOSIS — I10 Essential (primary) hypertension: Secondary | ICD-10-CM | POA: Diagnosis not present

## 2016-05-16 DIAGNOSIS — Z125 Encounter for screening for malignant neoplasm of prostate: Secondary | ICD-10-CM | POA: Diagnosis not present

## 2016-05-16 DIAGNOSIS — E1129 Type 2 diabetes mellitus with other diabetic kidney complication: Secondary | ICD-10-CM | POA: Diagnosis not present

## 2016-05-23 DIAGNOSIS — E1140 Type 2 diabetes mellitus with diabetic neuropathy, unspecified: Secondary | ICD-10-CM | POA: Diagnosis not present

## 2016-05-23 DIAGNOSIS — N182 Chronic kidney disease, stage 2 (mild): Secondary | ICD-10-CM | POA: Diagnosis not present

## 2016-05-23 DIAGNOSIS — E1151 Type 2 diabetes mellitus with diabetic peripheral angiopathy without gangrene: Secondary | ICD-10-CM | POA: Diagnosis not present

## 2016-05-23 DIAGNOSIS — I10 Essential (primary) hypertension: Secondary | ICD-10-CM | POA: Diagnosis not present

## 2016-05-23 DIAGNOSIS — I700 Atherosclerosis of aorta: Secondary | ICD-10-CM | POA: Diagnosis not present

## 2016-05-23 DIAGNOSIS — M1990 Unspecified osteoarthritis, unspecified site: Secondary | ICD-10-CM | POA: Diagnosis not present

## 2016-05-23 DIAGNOSIS — I129 Hypertensive chronic kidney disease with stage 1 through stage 4 chronic kidney disease, or unspecified chronic kidney disease: Secondary | ICD-10-CM | POA: Diagnosis not present

## 2016-05-23 DIAGNOSIS — Z1212 Encounter for screening for malignant neoplasm of rectum: Secondary | ICD-10-CM | POA: Diagnosis not present

## 2016-05-23 DIAGNOSIS — G609 Hereditary and idiopathic neuropathy, unspecified: Secondary | ICD-10-CM | POA: Diagnosis not present

## 2016-05-23 DIAGNOSIS — E784 Other hyperlipidemia: Secondary | ICD-10-CM | POA: Diagnosis not present

## 2016-05-23 DIAGNOSIS — Z6827 Body mass index (BMI) 27.0-27.9, adult: Secondary | ICD-10-CM | POA: Diagnosis not present

## 2016-05-23 DIAGNOSIS — Z0000 Encounter for general adult medical examination without abnormal findings: Secondary | ICD-10-CM | POA: Diagnosis not present

## 2016-05-23 DIAGNOSIS — E1129 Type 2 diabetes mellitus with other diabetic kidney complication: Secondary | ICD-10-CM | POA: Diagnosis not present

## 2016-09-27 DIAGNOSIS — N182 Chronic kidney disease, stage 2 (mild): Secondary | ICD-10-CM | POA: Diagnosis not present

## 2016-09-27 DIAGNOSIS — I700 Atherosclerosis of aorta: Secondary | ICD-10-CM | POA: Diagnosis not present

## 2016-09-27 DIAGNOSIS — I10 Essential (primary) hypertension: Secondary | ICD-10-CM | POA: Diagnosis not present

## 2016-09-27 DIAGNOSIS — Z6827 Body mass index (BMI) 27.0-27.9, adult: Secondary | ICD-10-CM | POA: Diagnosis not present

## 2016-09-27 DIAGNOSIS — E784 Other hyperlipidemia: Secondary | ICD-10-CM | POA: Diagnosis not present

## 2016-09-27 DIAGNOSIS — E1140 Type 2 diabetes mellitus with diabetic neuropathy, unspecified: Secondary | ICD-10-CM | POA: Diagnosis not present

## 2016-09-27 DIAGNOSIS — I129 Hypertensive chronic kidney disease with stage 1 through stage 4 chronic kidney disease, or unspecified chronic kidney disease: Secondary | ICD-10-CM | POA: Diagnosis not present

## 2016-09-27 DIAGNOSIS — Z23 Encounter for immunization: Secondary | ICD-10-CM | POA: Diagnosis not present

## 2016-09-27 DIAGNOSIS — E1129 Type 2 diabetes mellitus with other diabetic kidney complication: Secondary | ICD-10-CM | POA: Diagnosis not present

## 2016-09-27 DIAGNOSIS — G609 Hereditary and idiopathic neuropathy, unspecified: Secondary | ICD-10-CM | POA: Diagnosis not present

## 2016-09-27 DIAGNOSIS — E1151 Type 2 diabetes mellitus with diabetic peripheral angiopathy without gangrene: Secondary | ICD-10-CM | POA: Diagnosis not present

## 2017-02-14 DIAGNOSIS — G608 Other hereditary and idiopathic neuropathies: Secondary | ICD-10-CM | POA: Diagnosis not present

## 2017-02-14 DIAGNOSIS — I700 Atherosclerosis of aorta: Secondary | ICD-10-CM | POA: Diagnosis not present

## 2017-02-14 DIAGNOSIS — N182 Chronic kidney disease, stage 2 (mild): Secondary | ICD-10-CM | POA: Diagnosis not present

## 2017-02-14 DIAGNOSIS — M1990 Unspecified osteoarthritis, unspecified site: Secondary | ICD-10-CM | POA: Diagnosis not present

## 2017-02-14 DIAGNOSIS — I10 Essential (primary) hypertension: Secondary | ICD-10-CM | POA: Diagnosis not present

## 2017-02-14 DIAGNOSIS — Z6827 Body mass index (BMI) 27.0-27.9, adult: Secondary | ICD-10-CM | POA: Diagnosis not present

## 2017-02-14 DIAGNOSIS — I129 Hypertensive chronic kidney disease with stage 1 through stage 4 chronic kidney disease, or unspecified chronic kidney disease: Secondary | ICD-10-CM | POA: Diagnosis not present

## 2017-02-14 DIAGNOSIS — E1140 Type 2 diabetes mellitus with diabetic neuropathy, unspecified: Secondary | ICD-10-CM | POA: Diagnosis not present

## 2017-02-14 DIAGNOSIS — E1151 Type 2 diabetes mellitus with diabetic peripheral angiopathy without gangrene: Secondary | ICD-10-CM | POA: Diagnosis not present

## 2017-02-14 DIAGNOSIS — Z1389 Encounter for screening for other disorder: Secondary | ICD-10-CM | POA: Diagnosis not present

## 2017-02-14 DIAGNOSIS — E784 Other hyperlipidemia: Secondary | ICD-10-CM | POA: Diagnosis not present

## 2017-02-14 DIAGNOSIS — E1129 Type 2 diabetes mellitus with other diabetic kidney complication: Secondary | ICD-10-CM | POA: Diagnosis not present

## 2017-03-13 DIAGNOSIS — M542 Cervicalgia: Secondary | ICD-10-CM | POA: Diagnosis not present

## 2017-03-13 DIAGNOSIS — Z6826 Body mass index (BMI) 26.0-26.9, adult: Secondary | ICD-10-CM | POA: Diagnosis not present

## 2017-03-29 ENCOUNTER — Ambulatory Visit (INDEPENDENT_AMBULATORY_CARE_PROVIDER_SITE_OTHER): Payer: Medicare Other | Admitting: Neurology

## 2017-03-29 ENCOUNTER — Encounter: Payer: Self-pay | Admitting: Neurology

## 2017-03-29 ENCOUNTER — Encounter (INDEPENDENT_AMBULATORY_CARE_PROVIDER_SITE_OTHER): Payer: Self-pay

## 2017-03-29 DIAGNOSIS — G7000 Myasthenia gravis without (acute) exacerbation: Secondary | ICD-10-CM

## 2017-03-29 HISTORY — DX: Myasthenia gravis without (acute) exacerbation: G70.00

## 2017-03-29 MED ORDER — PYRIDOSTIGMINE BROMIDE 60 MG PO TABS: 60.0000 mg | ORAL_TABLET | Freq: Three times a day (TID) | ORAL | 3 refills | Status: DC

## 2017-03-29 NOTE — Patient Instructions (Signed)
   With the Mestinon 60 mg tablet, take 1/2 tablet three times a day for 2 weeks, then take one tablet three times a day.

## 2017-03-29 NOTE — Progress Notes (Signed)
Reason for visit: Myasthenia gravis  Referring physician: Dr. Yetta Numbers [name] is a 83 y.o. male  History of present illness:  [name] is an 83 year old right-handed black male with a history of myasthenia gravis that apparently was diagnosed in 2012. The patient presented with some hoarseness of his voice and some double vision. He is seronegative, he had the diagnosis made by single fiber EMG at [facility]. The patient apparently was seen through this office in 2013, he was placed on Mestinon, he went off the medication as he did not believe that this was helpful. The patient continues to have some double vision, he has incomplete medial deviation of the right eye when looking to the left. The patient denies any ptosis problems or difficulty with chewing or swallowing, occasionally he may have some hoarseness of his voice in the evening hours. He denies any weakness of the extremities, and no significant fatigue. The patient may occasionally have some numbness in the fingertips on the left greater than right hand. He denies any problems controlling the bowels or the bladder. He has not had any balance issues. He returns to this office for an evaluation.  Past Medical History:  Diagnosis Date  . Hypertension   . Kidney stone     Past Surgical History:  Procedure Laterality Date  . KNEE SURGERY    . PROSTATE SURGERY      History reviewed. No pertinent family history.  Social history:  reports that he has quit smoking. He has quit using smokeless tobacco. His smokeless tobacco use included Chew. He reports that he does not drink alcohol or use drugs.  Medications:  Prior to Admission medications   Medication Sig Start Date End Date Taking? Authorizing Provider  aspirin EC 81 MG tablet Take 81 mg by mouth daily.   Yes Historical Provider, MD  lisinopril (PRINIVIL,ZESTRIL) 10 MG tablet Take 10 mg by mouth daily.   Yes Historical Provider, MD  pravastatin (PRAVACHOL) 20 MG  tablet Take 20 mg by mouth daily.   Yes Historical Provider, MD      Allergies  Allergen Reactions  . Aspirin     REACTION: full strength; causes gi irritation    ROS:  Out of a complete 14 system review of symptoms, the patient complains only of the following symptoms, and all other reviewed systems are negative.  Blurred vision, double vision, loss of vision Snoring Diarrhea Anemia Feeling cold Joint pain Runny nose Memory loss, dizziness  Blood pressure (!) 148/74, pulse (!) 54, height  (1.803 m), weight 185 lb (83.9 kg).  Physical Exam  General: The patient is alert and cooperative at the time of the examination.  Eyes: Pupils are equal, round, and reactive to light. Discs are flat bilaterally.  Neck: The neck is supple, no carotid bruits are noted.  Respiratory: The respiratory examination is clear.  Cardiovascular: The cardiovascular examination reveals a regular rate and rhythm, no obvious murmurs or rubs are noted.  Skin: Extremities are without significant edema.  Neurologic Exam  Mental status: The patient is alert and oriented x 3 at the time of the examination. The patient has apparent normal recent and remote memory, with an apparently normal attention span and concentration ability.  Cranial nerves: Facial symmetry is present. There is good sensation of the face to pinprick and soft touch bilaterally. The strength of the facial muscles and the muscles to head turning and shoulder shrug are normal bilaterally. Speech is well enunciated, no  aphasia or dysarthria is noted. Extraocular movements are full, with exception of incomplete medial deviation of the right eye when looking to the left. Visual fields are full. The tongue is midline, and the patient has symmetric elevation of the soft palate. No obvious hearing deficits are noted. With superior gaze for 1 minute, the patient will note subjective double vision at 30 seconds, no increase in ptosis was  seen.  Motor: The motor testing reveals 5 over 5 strength of all 4 extremities. Good symmetric motor tone is noted throughout. With arms outstretched 1 minute, no fatigable weakness of the deltoid muscles were noted on either side.  Sensory: Sensory testing is intact to pinprick, soft touch, vibration sensation, and position sense on all 4 extremities. No evidence of extinction is noted.  Coordination: Cerebellar testing reveals good finger-nose-finger and heel-to-shin bilaterally.  Gait and station: Gait is normal. Tandem gait is normal. Romberg is negative. No drift is seen.  Reflexes: Deep tendon reflexes are symmetric and normal bilaterally. Toes are downgoing bilaterally.   Assessment/Plan:  1. Myasthenia gravis by single fiber EMG  2. Double vision  The patient has had a relatively benign course over many years with the myasthenia gravis. The patient appears to have a fixed weakness of the right eye with medial deviation. It is possible that prisms may help the double vision. He will be given another trial on Mestinon, he will follow-up in 6 months. He will call our office if he cannot tolerate the medication. I would not place the patient on prednisone or Imuran for this level of symptomatology.  Marlan Palau MD 03/29/2017 9:58 AM  [facility] [address] [place], [place] [address]  Phone [phone] Fax [phone]

## 2017-05-10 DIAGNOSIS — I10 Essential (primary) hypertension: Secondary | ICD-10-CM | POA: Diagnosis not present

## 2017-05-10 DIAGNOSIS — E1140 Type 2 diabetes mellitus with diabetic neuropathy, unspecified: Secondary | ICD-10-CM | POA: Diagnosis not present

## 2017-05-10 DIAGNOSIS — Z125 Encounter for screening for malignant neoplasm of prostate: Secondary | ICD-10-CM | POA: Diagnosis not present

## 2017-05-24 DIAGNOSIS — Z1212 Encounter for screening for malignant neoplasm of rectum: Secondary | ICD-10-CM | POA: Diagnosis not present

## 2017-05-27 DIAGNOSIS — E1151 Type 2 diabetes mellitus with diabetic peripheral angiopathy without gangrene: Secondary | ICD-10-CM | POA: Diagnosis not present

## 2017-05-27 DIAGNOSIS — N182 Chronic kidney disease, stage 2 (mild): Secondary | ICD-10-CM | POA: Diagnosis not present

## 2017-05-27 DIAGNOSIS — E1140 Type 2 diabetes mellitus with diabetic neuropathy, unspecified: Secondary | ICD-10-CM | POA: Diagnosis not present

## 2017-05-27 DIAGNOSIS — Z0000 Encounter for general adult medical examination without abnormal findings: Secondary | ICD-10-CM | POA: Diagnosis not present

## 2017-05-27 DIAGNOSIS — Z6827 Body mass index (BMI) 27.0-27.9, adult: Secondary | ICD-10-CM | POA: Diagnosis not present

## 2017-05-27 DIAGNOSIS — G609 Hereditary and idiopathic neuropathy, unspecified: Secondary | ICD-10-CM | POA: Diagnosis not present

## 2017-05-27 DIAGNOSIS — I10 Essential (primary) hypertension: Secondary | ICD-10-CM | POA: Diagnosis not present

## 2017-05-27 DIAGNOSIS — E784 Other hyperlipidemia: Secondary | ICD-10-CM | POA: Diagnosis not present

## 2017-05-27 DIAGNOSIS — Z23 Encounter for immunization: Secondary | ICD-10-CM | POA: Diagnosis not present

## 2017-05-27 DIAGNOSIS — E1129 Type 2 diabetes mellitus with other diabetic kidney complication: Secondary | ICD-10-CM | POA: Diagnosis not present

## 2017-05-27 DIAGNOSIS — M542 Cervicalgia: Secondary | ICD-10-CM | POA: Diagnosis not present

## 2017-05-27 DIAGNOSIS — I129 Hypertensive chronic kidney disease with stage 1 through stage 4 chronic kidney disease, or unspecified chronic kidney disease: Secondary | ICD-10-CM | POA: Diagnosis not present

## 2017-05-27 DIAGNOSIS — I700 Atherosclerosis of aorta: Secondary | ICD-10-CM | POA: Diagnosis not present

## 2017-09-19 DIAGNOSIS — H6122 Impacted cerumen, left ear: Secondary | ICD-10-CM | POA: Diagnosis not present

## 2017-09-30 DIAGNOSIS — E7849 Other hyperlipidemia: Secondary | ICD-10-CM | POA: Diagnosis not present

## 2017-09-30 DIAGNOSIS — Z23 Encounter for immunization: Secondary | ICD-10-CM | POA: Diagnosis not present

## 2017-09-30 DIAGNOSIS — I700 Atherosclerosis of aorta: Secondary | ICD-10-CM | POA: Diagnosis not present

## 2017-09-30 DIAGNOSIS — E1140 Type 2 diabetes mellitus with diabetic neuropathy, unspecified: Secondary | ICD-10-CM | POA: Diagnosis not present

## 2017-09-30 DIAGNOSIS — N400 Enlarged prostate without lower urinary tract symptoms: Secondary | ICD-10-CM | POA: Diagnosis not present

## 2017-09-30 DIAGNOSIS — E1151 Type 2 diabetes mellitus with diabetic peripheral angiopathy without gangrene: Secondary | ICD-10-CM | POA: Diagnosis not present

## 2017-09-30 DIAGNOSIS — N182 Chronic kidney disease, stage 2 (mild): Secondary | ICD-10-CM | POA: Diagnosis not present

## 2017-09-30 DIAGNOSIS — Z6825 Body mass index (BMI) 25.0-25.9, adult: Secondary | ICD-10-CM | POA: Diagnosis not present

## 2017-09-30 DIAGNOSIS — M542 Cervicalgia: Secondary | ICD-10-CM | POA: Diagnosis not present

## 2017-09-30 DIAGNOSIS — I10 Essential (primary) hypertension: Secondary | ICD-10-CM | POA: Diagnosis not present

## 2017-09-30 DIAGNOSIS — I129 Hypertensive chronic kidney disease with stage 1 through stage 4 chronic kidney disease, or unspecified chronic kidney disease: Secondary | ICD-10-CM | POA: Diagnosis not present

## 2017-09-30 DIAGNOSIS — G609 Hereditary and idiopathic neuropathy, unspecified: Secondary | ICD-10-CM | POA: Diagnosis not present

## 2017-10-11 ENCOUNTER — Encounter: Payer: Self-pay | Admitting: Neurology

## 2017-10-11 ENCOUNTER — Ambulatory Visit (INDEPENDENT_AMBULATORY_CARE_PROVIDER_SITE_OTHER): Payer: Medicare Other | Admitting: Neurology

## 2017-10-11 VITALS — BP 181/72 | HR 55 | Wt 182.0 lb

## 2017-10-11 DIAGNOSIS — G7000 Myasthenia gravis without (acute) exacerbation: Secondary | ICD-10-CM | POA: Diagnosis not present

## 2017-10-11 MED ORDER — PYRIDOSTIGMINE BROMIDE ER 180 MG PO TBCR: 180.0000 mg | EXTENDED_RELEASE_TABLET | Freq: Every day | ORAL | 5 refills | Status: DC

## 2017-10-11 NOTE — Progress Notes (Signed)
Reason for visit: Myasthenia gravis  [name]. is an 83 y.o. 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 The patient has had persistent double vision, he has been placed on Mestinon and it is not clear that this is helpful to him. He does miss doses frequently. He denies any problems with speech or swallowing. He has no weakness of the extremities. He is otherwise tolerating Mestinon well.  Past Medical History:  Diagnosis Date  . Hypertension   . Kidney stone   . Myasthenia gravis (HCC) 03/29/2017    Past Surgical History:  Procedure Laterality Date  . KNEE SURGERY    . PROSTATE SURGERY      History reviewed. No pertinent family history.  Social history:  reports that he has quit smoking. He has quit using smokeless tobacco. His smokeless tobacco use included Chew. He reports that he does not drink alcohol or use drugs.    Allergies  Allergen Reactions  . Aspirin     REACTION: full strength; causes gi irritation    Medications:  Prior to Admission medications   Medication Sig Start Date End Date Taking? Authorizing Provider  aspirin EC 81 MG tablet Take 81 mg by mouth daily.   Yes [provider]  lisinopril (PRINIVIL,ZESTRIL) 10 MG tablet Take 10 mg by mouth daily.   Yes [provider]  pantoprazole (PROTONIX) 40 MG tablet Take 40 mg by mouth daily. 09/30/17  Yes [provider]  pravastatin (PRAVACHOL) 20 MG tablet Take 20 mg by mouth daily.   Yes [provider]  pyridostigmine (MESTINON) 60 MG tablet Take 1 tablet (60 mg total) by mouth 3 (three) times daily. 03/29/17  Yes York Spaniel, MD    ROS:  Out of a complete 14 system review of symptoms, the patient complains only of the following symptoms, and all other reviewed systems are  negative.  Decreased appetite, weight loss Runny nose Double vision, blurred vision Cold intolerance Diarrhea Snoring Joint pain Anemia, memory loss  Blood pressure (!) 181/72, pulse (!) 55, weight 182 lb (82.6 kg).  Physical Exam  General: The patient is alert and cooperative at the time of the examination.  Skin: No significant peripheral edema is noted.   Neurologic Exam  Mental status: The patient is alert and oriented x 3 at the time of the examination. The patient has apparent normal recent and remote memory, with an apparently normal attention span and concentration ability.   Cranial nerves: Facial symmetry is present. Speech is normal, no aphasia or dysarthria is noted. Extraocular movements are full, but with turning eyes to the left the right eye has incomplete medial deviation and is superior to the left eye. Visual fields are full.  Motor: The patient has good strength in all 4 extremities.  Sensory examination: Soft touch sensation is symmetric on the face, arms, and legs.  Coordination: The patient has good finger-nose-finger and heel-to-shin bilaterally.  Gait and station: The patient has a normal gait. Tandem gait is slightly unsteady. Romberg is negative. No drift is seen.  Reflexes: Deep tendon reflexes are symmetric.   Assessment/Plan:  1. Myasthenia gravis  It is not clear that the patient has benefited from Mestinon, he is missing doses frequently however. The patient will be converted to the 180 mg extended-release tablet taking one daily. He  will follow-up in 6 months. He will call if he is not tolerating the drug. 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  Marlan Palau. Keith Yohann Curl MD 10/11/2017 9:21 AM  [facility] [address] [place], [place] [id number]  Phone [phone] Fax [phone]

## 2017-10-11 NOTE — Patient Instructions (Signed)
We will convert to the long acting preparation of Mestinon taking one 180 mg tablet in the morning.

## 2017-10-15 ENCOUNTER — Telehealth: Payer: Self-pay | Admitting: Neurology

## 2017-10-15 MED ORDER — PYRIDOSTIGMINE BROMIDE 60 MG PO TABS: 60.0000 mg | ORAL_TABLET | Freq: Three times a day (TID) | ORAL | Status: DC

## 2017-10-15 NOTE — Telephone Encounter (Signed)
The long-acting Mestinon tablet would cost over $500 a month, we will convert back to the short acting 60 mg 1 tablet 3 times daily.

## 2017-10-15 NOTE — Telephone Encounter (Signed)
I called and spoke with Dorene GrebeNatalie at [id number] - [place], [place] - [address]  She stated they spoke with pt/wife and they do not have any prescription coverage through their insurance. They verified this with them and advised them to contact our office. Rx expensive d/t no coverage.

## 2017-10-15 NOTE — Addendum Note (Signed)
Addended by: York SpanielWILLIS, [place] Latka K on: 10/15/2017 04:45 PM   Modules accepted: Orders

## 2017-10-15 NOTE — Telephone Encounter (Signed)
Patient's wife is calling to discuss pyridostigmine (MESTINON) 180 MG CR tablet. She says medication is too expensive. Please call and discuss.

## 2018-02-10 DIAGNOSIS — N400 Enlarged prostate without lower urinary tract symptoms: Secondary | ICD-10-CM | POA: Diagnosis not present

## 2018-02-10 DIAGNOSIS — E1140 Type 2 diabetes mellitus with diabetic neuropathy, unspecified: Secondary | ICD-10-CM | POA: Diagnosis not present

## 2018-02-10 DIAGNOSIS — Z1389 Encounter for screening for other disorder: Secondary | ICD-10-CM | POA: Diagnosis not present

## 2018-02-10 DIAGNOSIS — I129 Hypertensive chronic kidney disease with stage 1 through stage 4 chronic kidney disease, or unspecified chronic kidney disease: Secondary | ICD-10-CM | POA: Diagnosis not present

## 2018-02-10 DIAGNOSIS — E7849 Other hyperlipidemia: Secondary | ICD-10-CM | POA: Diagnosis not present

## 2018-02-10 DIAGNOSIS — E1151 Type 2 diabetes mellitus with diabetic peripheral angiopathy without gangrene: Secondary | ICD-10-CM | POA: Diagnosis not present

## 2018-02-10 DIAGNOSIS — M542 Cervicalgia: Secondary | ICD-10-CM | POA: Diagnosis not present

## 2018-02-10 DIAGNOSIS — Z6827 Body mass index (BMI) 27.0-27.9, adult: Secondary | ICD-10-CM | POA: Diagnosis not present

## 2018-02-10 DIAGNOSIS — M1990 Unspecified osteoarthritis, unspecified site: Secondary | ICD-10-CM | POA: Diagnosis not present

## 2018-02-10 DIAGNOSIS — N182 Chronic kidney disease, stage 2 (mild): Secondary | ICD-10-CM | POA: Diagnosis not present

## 2018-02-10 DIAGNOSIS — I700 Atherosclerosis of aorta: Secondary | ICD-10-CM | POA: Diagnosis not present

## 2018-02-10 DIAGNOSIS — G609 Hereditary and idiopathic neuropathy, unspecified: Secondary | ICD-10-CM | POA: Diagnosis not present

## 2018-04-16 ENCOUNTER — Ambulatory Visit (INDEPENDENT_AMBULATORY_CARE_PROVIDER_SITE_OTHER): Payer: Medicare Other | Admitting: Neurology

## 2018-04-16 ENCOUNTER — Encounter: Payer: Self-pay | Admitting: Neurology

## 2018-04-16 VITALS — BP 161/65 | HR 58 | Ht 71.0 in | Wt 186.0 lb

## 2018-04-16 DIAGNOSIS — G7000 Myasthenia gravis without (acute) exacerbation: Secondary | ICD-10-CM

## 2018-04-16 MED ORDER — PYRIDOSTIGMINE BROMIDE 60 MG PO TABS: 60.0000 mg | ORAL_TABLET | Freq: Three times a day (TID) | ORAL | 5 refills | Status: DC

## 2018-04-16 NOTE — Progress Notes (Signed)
Reason for visit: Myasthenia gravis  [name]. is an 84 y.o. male  History of present illness:  [name] is an 83 year old right-handed black male with a history of borderline diabetes and hypertension.  The patient has had some double vision, he has not benefited whatsoever from the use of Mestinon.  He indicates that the double vision is worse when looking to the left, it does not really impair his ability to drive much.  He may have some double vision with watching TV, he denies any ptosis of the eyelids.  The patient has not had any problems with chewing or swallowing problems or choking.  The patient has not had any weakness of the extremities.  He returns the office today for an evaluation.  Past Medical History:  Diagnosis Date  . Hypertension   . Kidney stone   . Myasthenia gravis (HCC) 03/29/2017    Past Surgical History:  Procedure Laterality Date  . KNEE SURGERY    . PROSTATE SURGERY      Family History  Problem Relation Age of Onset  . Heart attack Father   . Sarcoidosis Brother     Social history:  reports that he has quit smoking. He has quit using smokeless tobacco. His smokeless tobacco use included chew. He reports that he does not drink alcohol or use drugs.    Allergies  Allergen Reactions  . Aspirin     REACTION: full strength; causes gi irritation    Medications:  Prior to Admission medications   Medication Sig Start Date End Date Taking? Authorizing Provider  aspirin EC 81 MG tablet Take 81 mg by mouth daily.   Yes [provider]  lisinopril (PRINIVIL,ZESTRIL) 10 MG tablet Take 10 mg by mouth daily.   Yes [provider]  pravastatin (PRAVACHOL) 20 MG tablet Take 20 mg by mouth daily.   Yes [provider]  pyridostigmine (MESTINON) 60 MG tablet Take 1 tablet (60 mg total) by mouth 3 (three) times daily. 04/16/18  Yes York Spaniel, MD    ROS:  Out of a complete 14 system review of symptoms, the patient  complains only of the following symptoms, and all other reviewed systems are negative.  Double vision, blurred vision  Blood pressure (!) 161/65, pulse (!) 58, height 5\' 11"  (1.803 m), weight 186 lb (84.4 kg).  Physical Exam  General: The patient is alert and cooperative at the time of the examination.  Skin: No significant peripheral edema is noted.   Neurologic Exam  Mental status: The patient is alert and oriented x 3 at the time of the examination. The patient has apparent normal recent and remote memory, with an apparently normal attention span and concentration ability.   Cranial nerves: Facial symmetry is present. Speech is normal, no aphasia or dysarthria is noted. Extraocular movements are full, with exception that with left lateral gaze there is incomplete adduction of the right eye. Visual fields are full.  Motor: The patient has good strength in all 4 extremities.  Sensory examination: Soft touch sensation is symmetric on the face, arms, and legs.  Coordination: The patient has good finger-nose-finger and heel-to-shin bilaterally.  Gait and station: The patient has a normal gait. Tandem gait is normal. Romberg is negative. No drift is seen.  Reflexes: Deep tendon reflexes are symmetric.   Assessment/Plan:  1.  Myasthenia gravis  The patient appears to have a fixed weakness with the right eye with medial deviation.  Mestinon does not  appear to benefit him, the double vision issue does not impair his ability to function significantly, the use of prednisone in a borderline diabetic is therefore probably not warranted.  The patient will be followed over time, he may stop the Mestinon, he will call me if his symptoms worsen.  Otherwise, he will follow-up in 8 months.  A prescription for Mestinon was sent in case he needs this medication.  Marlan Palau. Keith Jaquann Guarisco MD 04/16/2018 10:32 AM  [facility] [address] [place], [place]  [id number]  Phone [phone] Fax [phone]

## 2018-05-01 ENCOUNTER — Telehealth: Payer: Self-pay | Admitting: Neurology

## 2018-05-01 MED ORDER — PREDNISONE 10 MG PO TABS: 10.0000 mg | ORAL_TABLET | Freq: Every day | ORAL | 3 refills | Status: DC

## 2018-05-01 NOTE — Telephone Encounter (Signed)
I called the patient.  The patient is on Mestinon, he continues to have double vision, we will add low-dose prednisone and see if this offers some benefit.  I am concerned that his double vision is related to a fixed weakness of the extraocular muscles.

## 2018-05-01 NOTE — Telephone Encounter (Signed)
Pt requesting a call back preferably from Dr. Anne Hahn  to discuss his medication, pt didn't know want to go into detail with me. Stating he can be reached at [phone] tomorrow between 9 and 5.

## 2018-05-20 ENCOUNTER — Encounter (HOSPITAL_COMMUNITY): Payer: Self-pay | Admitting: Emergency Medicine

## 2018-05-20 ENCOUNTER — Emergency Department (HOSPITAL_COMMUNITY): Payer: Medicare Other

## 2018-05-20 ENCOUNTER — Emergency Department (HOSPITAL_COMMUNITY)
Admission: EM | Admit: 2018-05-20 | Discharge: 2018-05-20 | Disposition: A | Discharge status: Home / Self Care | Payer: Medicare Other | Attending: Emergency Medicine | Admitting: Emergency Medicine

## 2018-05-20 DIAGNOSIS — R739 Hyperglycemia, unspecified: Secondary | ICD-10-CM

## 2018-05-20 DIAGNOSIS — Z7982 Long term (current) use of aspirin: Secondary | ICD-10-CM | POA: Insufficient documentation

## 2018-05-20 DIAGNOSIS — R7309 Other abnormal glucose: Secondary | ICD-10-CM

## 2018-05-20 DIAGNOSIS — I2699 Other pulmonary embolism without acute cor pulmonale: Secondary | ICD-10-CM | POA: Diagnosis not present

## 2018-05-20 DIAGNOSIS — K219 Gastro-esophageal reflux disease without esophagitis: Secondary | ICD-10-CM | POA: Diagnosis present

## 2018-05-20 DIAGNOSIS — I700 Atherosclerosis of aorta: Secondary | ICD-10-CM | POA: Diagnosis not present

## 2018-05-20 DIAGNOSIS — R079 Chest pain, unspecified: Secondary | ICD-10-CM | POA: Diagnosis present

## 2018-05-20 DIAGNOSIS — I10 Essential (primary) hypertension: Secondary | ICD-10-CM

## 2018-05-20 DIAGNOSIS — R12 Heartburn: Secondary | ICD-10-CM | POA: Diagnosis not present

## 2018-05-20 DIAGNOSIS — Z87891 Personal history of nicotine dependence: Secondary | ICD-10-CM | POA: Diagnosis not present

## 2018-05-20 DIAGNOSIS — M546 Pain in thoracic spine: Secondary | ICD-10-CM | POA: Diagnosis not present

## 2018-05-20 DIAGNOSIS — M549 Dorsalgia, unspecified: Secondary | ICD-10-CM | POA: Insufficient documentation

## 2018-05-20 DIAGNOSIS — Z79899 Other long term (current) drug therapy: Secondary | ICD-10-CM | POA: Insufficient documentation

## 2018-05-20 DIAGNOSIS — Z7902 Long term (current) use of antithrombotics/antiplatelets: Secondary | ICD-10-CM | POA: Diagnosis not present

## 2018-05-20 DIAGNOSIS — I723 Aneurysm of iliac artery: Secondary | ICD-10-CM

## 2018-05-20 DIAGNOSIS — I2510 Atherosclerotic heart disease of native coronary artery without angina pectoris: Secondary | ICD-10-CM | POA: Diagnosis present

## 2018-05-20 DIAGNOSIS — R1013 Epigastric pain: Secondary | ICD-10-CM | POA: Diagnosis present

## 2018-05-20 HISTORY — DX: Atherosclerotic heart disease of native coronary artery without angina pectoris: I25.10

## 2018-05-20 HISTORY — DX: Chest pain, unspecified: R07.9

## 2018-05-20 HISTORY — DX: Aneurysm of iliac artery: I72.3

## 2018-05-20 HISTORY — DX: Atherosclerosis of aorta: I70.0

## 2018-05-20 HISTORY — DX: Other pulmonary embolism without acute cor pulmonale: I26.99

## 2018-05-20 HISTORY — DX: Gastro-esophageal reflux disease without esophagitis: K21.9

## 2018-05-20 LAB — CBC WITH DIFFERENTIAL/PLATELET
ABS IMMATURE GRANULOCYTES: 0 10*3/uL (ref 0.0–0.1)
Basophils Absolute: 0.1 10*3/uL (ref 0.0–0.1)
Basophils Relative: 1 %
EOS ABS: 0.2 10*3/uL (ref 0.0–0.7)
Eosinophils Relative: 4 %
HEMATOCRIT: 41.3 % (ref 39.0–52.0)
Hemoglobin: 13.2 g/dL (ref 13.0–17.0)
IMMATURE GRANULOCYTES: 0 %
LYMPHS ABS: 0.8 10*3/uL (ref 0.7–4.0)
Lymphocytes Relative: 15 %
MCH: 29.6 pg (ref 26.0–34.0)
MCHC: 32 g/dL (ref 30.0–36.0)
MCV: 92.6 fL (ref 78.0–100.0)
MONOS PCT: 9 %
Monocytes Absolute: 0.5 10*3/uL (ref 0.1–1.0)
NEUTROS ABS: 3.8 10*3/uL (ref 1.7–7.7)
NEUTROS PCT: 71 %
Platelets: 255 10*3/uL (ref 150–400)
RBC: 4.46 MIL/uL (ref 4.22–5.81)
RDW: 14.6 % (ref 11.5–15.5)
WBC: 5.4 10*3/uL (ref 4.0–10.5)

## 2018-05-20 LAB — BASIC METABOLIC PANEL
Anion gap: 10 (ref 5–15)
BUN: 16 mg/dL (ref 6–20)
CO2: 27 mmol/L (ref 22–32)
Calcium: 9.6 mg/dL (ref 8.9–10.3)
Chloride: 101 mmol/L (ref 101–111)
Creatinine, Ser: 1.24 mg/dL (ref 0.61–1.24)
GFR calc Af Amer: 60 mL/min — ABNORMAL LOW (ref >60)
GFR, EST NON AFRICAN AMERICAN: 52 mL/min — AB (ref >60)
Glucose, Bld: 174 mg/dL — ABNORMAL HIGH (ref 65–99)
POTASSIUM: 4.6 mmol/L (ref 3.5–5.1)
Sodium: 138 mmol/L (ref 135–145)

## 2018-05-20 LAB — PROTIME-INR
INR: 1.01
PROTHROMBIN TIME: 13.2 s (ref 11.4–15.2)

## 2018-05-20 LAB — HEMOGLOBIN A1C
HEMOGLOBIN A1C: 6.4 % — AB (ref 4.8–5.6)
MEAN PLASMA GLUCOSE: 136.98 mg/dL

## 2018-05-20 LAB — I-STAT TROPONIN, ED
Troponin i, poc: 0 ng/mL (ref 0.00–0.08)
Troponin i, poc: 0.01 ng/mL (ref 0.00–0.08)

## 2018-05-20 LAB — APTT: aPTT: 22 seconds — ABNORMAL LOW (ref 24–36)

## 2018-05-20 MED ORDER — ACETAMINOPHEN 325 MG PO TABS
650.0000 mg | ORAL_TABLET | Freq: Once | ORAL | Status: AC
  Administered 2018-05-20: 650 mg via ORAL
  Filled 2018-05-20: qty 2

## 2018-05-20 MED ORDER — HEPARIN BOLUS VIA INFUSION
4500.0000 [IU] | Freq: Once | INTRAVENOUS | Status: DC
  Filled 2018-05-20: qty 4500

## 2018-05-20 MED ORDER — LISINOPRIL 10 MG PO TABS
10.0000 mg | ORAL_TABLET | Freq: Every day | ORAL | Status: DC
  Filled 2018-05-20: qty 1

## 2018-05-20 MED ORDER — SODIUM CHLORIDE 0.9 % IV BOLUS
500.0000 mL | Freq: Once | INTRAVENOUS | Status: AC
  Administered 2018-05-20: 500 mL via INTRAVENOUS

## 2018-05-20 MED ORDER — IOPAMIDOL (ISOVUE-370) INJECTION 76%
INTRAVENOUS | Status: AC
  Filled 2018-05-20: qty 100

## 2018-05-20 MED ORDER — LABETALOL HCL 5 MG/ML IV SOLN
10.0000 mg | Freq: Once | INTRAVENOUS | Status: AC
  Administered 2018-05-20: 10 mg via INTRAVENOUS
  Filled 2018-05-20: qty 4

## 2018-05-20 MED ORDER — HEPARIN (PORCINE) IN NACL 100-0.45 UNIT/ML-% IJ SOLN
1400.0000 [IU]/h | INTRAMUSCULAR | Status: DC
  Filled 2018-05-20 (×2): qty 250

## 2018-05-20 MED ORDER — RIVAROXABAN (XARELTO) EDUCATION KIT FOR DVT/PE PATIENTS
PACK | Freq: Once | Status: AC
  Administered 2018-05-20: 1

## 2018-05-20 MED ORDER — RIVAROXABAN 15 MG PO TABS
15.0000 mg | ORAL_TABLET | Freq: Two times a day (BID) | ORAL | Status: DC
  Administered 2018-05-20: 15 mg via ORAL
  Filled 2018-05-20 (×2): qty 1

## 2018-05-20 MED ORDER — RIVAROXABAN (XARELTO) VTE STARTER PACK (15 & 20 MG): ORAL_TABLET | ORAL | 0 refills | Status: DC

## 2018-05-20 MED ORDER — IOPAMIDOL (ISOVUE-370) INJECTION 76%
100.0000 mL | Freq: Once | INTRAVENOUS | Status: AC
  Administered 2018-05-20: 100 mL via INTRAVENOUS

## 2018-05-20 MED ORDER — RIVAROXABAN 20 MG PO TABS
20.0000 mg | ORAL_TABLET | Freq: Every day | ORAL | Status: DC
Start: 2018-06-10 — End: 2018-05-20

## 2018-05-20 MED ORDER — GI COCKTAIL ~~LOC~~
30.0000 mL | Freq: Once | ORAL | Status: AC
  Administered 2018-05-20: 30 mL via ORAL
  Filled 2018-05-20: qty 30

## 2018-05-20 NOTE — ED Notes (Signed)
Wheeled patient to the bathroom patient did well getting up

## 2018-05-20 NOTE — Progress Notes (Addendum)
ANTICOAGULATION CONSULT NOTE - Initial Consult  Pharmacy Consult for heparin Indication: pulmonary embolus  Allergies  Allergen Reactions  . Aspirin     REACTION: full strength; causes gi irritation    Patient Measurements: Height:  (180.3 cm) Weight: 185 lb (83.9 kg) IBW/kg (Calculated) : 75.3 Heparin Dosing Weight: 84 kg   Vital Signs: Temp: 98.9 F (37.2 C) (05/28 0548) Temp Source: Oral (05/28 0548) BP: 158/132 (05/28 0745) Pulse Rate: 58 (05/28 0745)  Labs: Recent Labs    05/20/18 0554  HGB 13.2  HCT 41.3  PLT 255  CREATININE 1.24    Estimated Creatinine Clearance: 47.2 mL/min (by C-G formula based on SCr of 1.24 mg/dL).   Medical History: Past Medical History:  Diagnosis Date  . Hypertension   . Kidney stone   . Myasthenia gravis (HCC) 03/29/2017    Medications:   (Not in a hospital admission)  Assessment: 74 YOM here with epigastric pain. CT PE shows multiple bilateral segmental and subsegmental acute pulmonary emboli. Negative for right heart strain. WBC wnl. H/H and Plt wnl.    Goal of Therapy:  Heparin level 0.3-0.7 units/ml Monitor platelets by anticoagulation protocol: Yes   Plan:  -Heparin 4500 units IV bolus, then start IV heparin at 1400 units/hr -F/u 8 hr HL -Monitor daily HL, CBC and s/s of bleeding   Vinnie Level, PharmD., BCPS Clinical Pharmacist Clinical phone for 05/20/18 until 3:30pm: [phone] If after 3:30pm, please call main pharmacy at: [phone]  Addendum: Now switching patient to Xarelto. IV heparin has been stopped. Start Xarelto 15 mg twice daily x 21 days. Then switch to Xarelto 20 mg daily. Patient has been educated.   Vinnie Level, PharmD., BCPS Clinical Pharmacist

## 2018-05-20 NOTE — ED Notes (Signed)
Pharmacist at bedsid eto give xarelto education. Family with patient.

## 2018-05-20 NOTE — ED Notes (Signed)
ED Provider at bedside. 

## 2018-05-20 NOTE — Consult Note (Signed)
[facility] Medical Consultation  [name]. ZOX:[id number] DOB: [birth year] DOA: 05/20/2018 PCP: Geoffry Paradise, MD   Requesting physician: Aviva Kluver PA Date of consultation: 05/20/2018 Reason for consultation: admission  Impression/Recommendations Active Problems:   Pulmonary embolism (HCC)   Hypertension   Chest pain   Hyperglycemia   GERD (gastroesophageal reflux disease)   Coronary atherosclerosis   Aortic atherosclerosis (HCC)  Recommend discharge to home with Eliquis, analgesia for mild pain and close OP follow up with PCP  #1. Pulmonary embolism. CT of chest reveals multiple bilateral segmental and subsegmental acute pulmonary emboli. Negative for heart strain. No evidence of acute aortic syndrome. In the emergency department patient is afebrile somewhat hypertensive without hypoxia or dyspnea. Troponin is negative 2. EKG without acute changes. Patient reports pain 2 out of 10. Heparin drip was initiated in the emergency department.  -Recommend discharge with prescription for Eliquis and analgesia for mild pain -provide today's dose lisinopril as patient not had antihypertensive med today -no working and/or golf for 1 week -Op follow up with PCP -instructed patient to return to ED should symptoms fail to continue to improve and/or worsen  2. Chest pain. Atypical. Heart score 4. Troponins negative 2. EKG without acute changes. CT angio chest reveals PE no evidence of acute aortic syndrome. -See #1 -Discussed with primary care provider need for OP cardiac workup -continue gi cocktail  3. Coronary atherosclerosis/aortic atherosclerosis. Per CT as noted above. -discuss with PCP need for OP workup/treatment  #4. Hyperglycemia. Chart review indicates history of hyperglycemia. Home meds indicate no diagnosis diabetes. Serum glucose 174 in the emergency department -Requested a hemoglobin A1c -education regarding a low-carb diet -outpatient follow-up with  primary care provider  #5. Hypertension. Home medications include lisinopril. Blood pressure elevated in the emergency department.. She had not taken his antihypertensive medication today. -Recommend providing his home lisinopril prior to discharge -pain management   Thank you for this consultation.  Chief Complaint: chest pain  HPI:  [name] is a delightful 84 year old history of hypertension myasthenia gravis, gERD, hyperglycemia presents to the emergency department with the chief complaint chest pain and shortness of breath. Initial evaluation includes a CT angio of chest revealing multiple segmental and subsegmental bilateral pulmonary emboli. [facility] asked to admit.  Information is obtained from the patient and his wife who is at the bedside. Reports a long history of epigastric pain particularly associated with eating but he treats with Prilosec. He has noted over the last 2-3 days this pain has worsened. He states yesterday he played a round of golf carrying his bag in the 90 weather without chest pain. However, yesterday evening he developed epigastric pain similar to his "GERD". States the pain radiated bilateral sides particularly right thoracic area. Pain was worse with movement and deep breathing. He also states that it became "difficult to manage" the discomfort . He denies shortness of breath diaphoresis nausea vomiting. He denies lower extremity edema headache dizziness syncope or near-syncope. He denies dysuria hematuria frequency or urgency. He denies diarrhea constipation melena bright red blood per rectum.  Review of Systems:  10 point review of systems complete and all systems are negative except as indicated in the history of present illness  Past Medical History:  Diagnosis Date  . Aortic atherosclerosis (HCC)   . Chest pain   . Coronary atherosclerosis   . GERD (gastroesophageal reflux disease)   . Hypertension   . Iliac artery aneurysm (HCC)   .  Kidney stone   .  Myasthenia gravis (HCC) 03/29/2017  . Pulmonary embolism [facility])    Past Surgical History:  Procedure Laterality Date  . KNEE SURGERY    . PROSTATE SURGERY     Social History:  reports that he has quit smoking. He has quit using smokeless tobacco. His smokeless tobacco use included chew. He reports that he does not drink alcohol or use drugs.  Allergies  Allergen Reactions  . Aspirin     REACTION: full strength; causes gi irritation   Family History  Problem Relation Age of Onset  . Heart attack Father   . Sarcoidosis Brother     Prior to Admission medications   Medication Sig Start Date End Date Taking? Authorizing Provider  aspirin EC 81 MG tablet Take 81 mg by mouth daily.   Yes [provider]  lisinopril (PRINIVIL,ZESTRIL) 10 MG tablet Take 10 mg by mouth daily.   Yes [provider]  pravastatin (PRAVACHOL) 20 MG tablet Take 20 mg by mouth daily.   Yes [provider]  predniSONE (DELTASONE) 10 MG tablet Take 1 tablet (10 mg total) by mouth daily with breakfast. 05/01/18  Yes York Spaniel, MD   Physical Exam: Blood pressure (!) 179/88, pulse 64, temperature 98.9 F (37.2 C), temperature source Oral, resp. rate 14, height  (1.803 m), weight 83.9 kg (185 lb), SpO2 97 %. Vitals:   05/20/18 0845 05/20/18 0900  BP: (!) 172/85 (!) 179/88  Pulse: 64 64  Resp: 13 14  Temp:    SpO2: 99% 97%     General:  Well-nourished looking younger than his stated age sitting up in bed in no acute distress  Eyes: pupils equal round reactive to light EOMI no scleral icterus  ENT: ears clear nose without drainage oropharynx without erythema or exudate  Neck: supple no JVD no lymphadenopathy  Cardiovascular: . Rate and rhythm no murmur gallop or rub no lower extremity edema pedal pulses palpable bilaterally  Respiratory: no increased work of breathing breath sounds with good air movement I hear no rhonchi no crackles  Abdomen: soft  round positive bowel sounds throughout no guarding or rebounding  Skin: warm and dry no rashes or lesions  Musculoskeletal: joints without swelling/erythema full range of motion  Psychiatric: calm cooperative appropriate  Neurologic: alert and oriented 3 speech clear facial symmetry lateral grip 5 out of 5 lower extremity strength 5 out of 5  Labs on Admission:  Basic Metabolic Panel: Recent Labs  Lab 05/20/18 0554  NA 138  K 4.6  CL 101  CO2 27  GLUCOSE 174*  BUN 16  CREATININE 1.24  CALCIUM 9.6   Liver Function Tests: No results for input(s): AST, ALT, ALKPHOS, BILITOT, PROT, ALBUMIN in the last 168 hours. No results for input(s): LIPASE, AMYLASE in the last 168 hours. No results for input(s): AMMONIA in the last 168 hours. CBC: Recent Labs  Lab 05/20/18 0554  WBC 5.4  NEUTROABS 3.8  HGB 13.2  HCT 41.3  MCV 92.6  PLT 255   Cardiac Enzymes: No results for input(s): CKTOTAL, CKMB, CKMBINDEX, TROPONINI in the last 168 hours. BNP: Invalid input(s): POCBNP CBG: No results for input(s): GLUCAP in the last 168 hours.  Radiological Exams on Admission: Dg Chest 2 View  Result Date: 05/20/2018 CLINICAL DATA:  Worsening heartburn for 2-3 weeks EXAM: CHEST - 2 VIEW COMPARISON:  04/30/2011 FINDINGS: The heart size and mediastinal contours are within normal limits. Both lungs are clear. The visualized skeletal structures are unremarkable. IMPRESSION: No  active cardiopulmonary disease. Electronically Signed   By: Deatra Robinson M.D.   On: 05/20/2018 06:09   Ct Angio Chest/abd/pel For Dissection W And/or W/wo  Result Date: 05/20/2018 CLINICAL DATA:  Thoracic pain.  Chest pain with normal EKG. EXAM: CT ANGIOGRAPHY CHEST, ABDOMEN AND PELVIS TECHNIQUE: Multidetector CT imaging through the chest, abdomen and pelvis was performed using the standard protocol during bolus administration of intravenous contrast. Multiplanar reconstructed images and MIPs were obtained and reviewed to  evaluate the vascular anatomy. CONTRAST:  100 cc Isovue 370 intravenous COMPARISON:  Abdominal CT 04/08/2011 FINDINGS: CTA CHEST FINDINGS Cardiovascular: Noncontrast phase is negative for intramural aortic hematoma. When accounting for motion artifact there is no dissection flap. Diffuse atherosclerotic wall thickening of the aorta with mild plaque irregularity at the arch and proximal descending segment. The neighboring fat is not infiltrated. The descending thoracic aorta measures up to 3 cm in diameter. Coronary atherosclerotic calcification. Bilateral segmental and subsegmental pulmonary artery filling defects with 6 individual clots marked on series 7. Borderline heart size. Negative for right heart strain. Mediastinum/Nodes: Subcentimeter thyroid nodules. Negative for mediastinal adenopathy. Lungs/Pleura: There is no edema, consolidation, effusion, or pneumothorax. Generalized airway thickening with occasional debris/mucous. Musculoskeletal: No acute or aggressive finding Review of the MIP images confirms the above findings. CTA ABDOMEN AND PELVIS FINDINGS VASCULAR Aorta: Diffuse atheromatous wall thickening with mural thrombus. Negative for dissection. Maximal diameter is distally at 2.4 cm. Celiac: Plaque at the origin without flow limiting stenosis. Negative for dissection. SMA: Atheromatous plaque, mainly noncalcified. Replaced right hepatic artery. No flow limiting stenosis or ulceration. Renals: Accessory left lower pole renal artery. There is atheromatous plaque without flow limiting stenosis or ulceration. IMA: Patent Inflow: Fusiform dilatation of the left common iliac artery measuring 2 cm, unchanged. No flow limiting stenosis or dissection Veins: Atherosclerosis without flow limiting stenosis. Review of the MIP images confirms the above findings. NON-VASCULAR Hepatobiliary: No focal liver abnormality.No evidence of biliary obstruction or stone. Pancreas: Unremarkable. Spleen: Unremarkable.  Adrenals/Urinary Tract: Negative adrenals. No hydronephrosis or stone. Bilateral renal cysts. Unremarkable bladder. Stomach/Bowel:  No obstruction. No inflammatory changes. Lymphatic: Chronic calcified ileocolic lymph nodes attributed to remote granulomatous inflammation given stability. No calcified bowel mass is seen. Reproductive:Negative Other: No ascites or pneumoperitoneum. Musculoskeletal: Degenerative changes without acute or aggressive finding. Critical Value/emergent results were called by telephone at the time of interpretation on 05/20/2018 at 8:37 am to Dr. Aviva Kluver , who verbally acknowledged these results. Review of the MIP images confirms the above findings. IMPRESSION: 1. Multiple bilateral segmental and subsegmental acute pulmonary emboli. Negative for right heart strain. 2. No evidence of acute aortic syndrome. 3.  Aortic Atherosclerosis (ICD10-I70.0).  Coronary atherosclerosis. 4. Bronchitic airway thickening of indeterminate chronicity. 5. 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 Electronically Signed   By: Marnee Spring M.D.   On: 05/20/2018 08:43    EKG: Independently reviewed. Sinus rhythm Probable anteroseptal infarct, recent  Time spent: 75 minutes  [facility]   If 7PM-7AM, please contact night-coverage [email or web address] Password [email or web address] 05/20/2018, 10:00 AM

## 2018-05-20 NOTE — Discharge Instructions (Addendum)
Information on my medicine - XARELTO (rivaroxaban)  This medication education was reviewed with me or my healthcare representative as part of my discharge preparation.  The pharmacist that spoke with me during my hospital stay was:  Sampson Si, [facility]  WHY WAS XARELTO PRESCRIBED FOR YOU? Xarelto was prescribed to treat blood clots that may have been found in the veins of your legs (deep vein thrombosis) or in your lungs (pulmonary embolism) and to reduce the risk of them occurring again.  WHAT DO YOU NEED TO KNOW ABOUT XARELTO? The starting dose is one 15 mg tablet taken TWICE daily with food for the FIRST 21 DAYS then on (enter date)  06/10/18  the dose is changed to one 20 mg tablet taken ONCE A DAY with your evening meal.  DO NOT stop taking Xarelto without talking to the health care provider who prescribed the medication.  Refill your prescription for 20 mg tablets before you run out.  After discharge, you should have regular check-up appointments with your healthcare provider that is prescribing your Xarelto.  In the future your dose may need to be changed if your kidney function changes by a significant amount.  WHAT DO YOU DO IF YOU MISS A DOSE? If you are taking Xarelto TWICE DAILY and you miss a dose, take it as soon as you remember. You may take two 15 mg tablets (total 30 mg) at the same time then resume your regularly scheduled 15 mg twice daily the next day.  If you are taking Xarelto ONCE DAILY and you miss a dose, take it as soon as you remember on the same day then continue your regularly scheduled once daily regimen the next day. Do not take two doses of Xarelto at the same time.   IMPORTANT SAFETY INFORMATION Xarelto is a blood thinner medicine that can cause bleeding. You should call your healthcare provider right away if you experience any of the following: Bleeding from an injury or your nose that does not stop. Unusual colored urine (red or dark brown) or  unusual colored stools (red or black). Unusual bruising for unknown reasons. A serious fall or if you hit your head (even if there is no bleeding).  Some medicines may interact with Xarelto and might increase your risk of bleeding while on Xarelto. To help avoid this, consult your healthcare provider or pharmacist prior to using any new prescription or non-prescription medications, including herbals, vitamins, non-steroidal anti-inflammatory drugs (NSAIDs) and supplements.  This website has more information on Xarelto: [email or web address].com.br.   Please follow-up with Dr. Lorain Childes in 5 to 7 days on the following that we discussed: Blood clots in the lungs.  He will take Xarelto 15 mg twice a day for 21 days, and then switch to 20 mg once a day.  He will also follow-up regarding any further work-up as to why he developed a pulmonary embolism as needed.  Please avoid golfing for 1 week. Elevated glucose.  Please discuss with Dr. Jacky Kindle treatment or monitoring of this. Cardiac work-up.  Please discuss obtaining cardiac work-up such as a stress test or cardiac ultrasound with Dr. Jacky Kindle.  This is at the recommendation of the hospital medicine physician that saw you today. You have evidence of calcification in your arteries.  Fortunately, you are already on blood pressure medication as well as cholesterol medication.  Thank you for allowing [place] to participate in your care today.  We greatly appreciate Dr. Lorain Childes following up on your treatment.  Please return  to the emergency department immediately if you develop any worsening chest pain, shortness of breath, dizziness or lightheadedness refill you are going to pass out, or weakness.

## 2018-05-20 NOTE — ED Notes (Signed)
Patient transported to CT 

## 2018-05-20 NOTE — ED Provider Notes (Addendum)
[facility] EMERGENCY DEPARTMENT Provider Note   CSN: [id number] Arrival date & time: 05/20/18  0865     History   Chief Complaint Chief Complaint  Patient presents with  . Chest Pain  . Gastroesophageal Reflux    HPI [name]. is a 84 y.o. male.  HPI  Patient is an 84 y.o. male with a history of hypertension, myasthenia gravis, and hyperlipidemia presenting for epigastric pain and thoracic to left-sided back pain.  Patient presents with his wife who assists in history taking.  Patient reports that he has had these symptoms particularly the epigastric pain for most of his life, however over the past 3 to 4 weeks the pain is been evolving.  Patient reports that he gets epigastric pain primarily in the evening particularly when lying down, and it is sometimes exacerbated by foods.  Patient describes it is similar to "reflux" type pain.  Patient reports that over the past couple weeks he has been noticing increasing back pain and is unsure if the 2 pains are related.  Patient reports yesterday he primarily had a discomfort, not reproducible by palpation to the left scapular region, which then migrated to the mid thoracic region today.  Patient reports that he feels that something "inside me".  Patient denies any shortness of breath, diaphoresis, nausea, vomiting, or lightheadedness.  Patient reports that he is physically active, golfing multiple times a week, and the pain he is experiencing is not exertional.  Patient denies taking any esophageal reflux medications to relieve symptoms.  Patient reports that he has a remote history of smoking and quit 30 to 35 years ago.  Patient has a family history of early MI in his father at the age of 85.  Patient denies any personal history of DVT/PE, hormone use, recent immobilization, hospitalization, recent surgery, or history of cancer treatment.  Past Medical History:  Diagnosis Date  . Hypertension   . Kidney stone   .  Myasthenia gravis (HCC) 03/29/2017    Patient Active Problem List   Diagnosis Date Noted  . Myasthenia gravis (HCC) 03/29/2017    Past Surgical History:  Procedure Laterality Date  . KNEE SURGERY    . PROSTATE SURGERY          Home Medications    Prior to Admission medications   Medication Sig Start Date End Date Taking? Authorizing Provider  aspirin EC 81 MG tablet Take 81 mg by mouth daily.    [provider]  lisinopril (PRINIVIL,ZESTRIL) 10 MG tablet Take 10 mg by mouth daily.    [provider]  pravastatin (PRAVACHOL) 20 MG tablet Take 20 mg by mouth daily.    [provider]  predniSONE (DELTASONE) 10 MG tablet Take 1 tablet (10 mg total) by mouth daily with breakfast. 05/01/18   York Spaniel, MD  pyridostigmine (MESTINON) 60 MG tablet Take 1 tablet (60 mg total) by mouth 3 (three) times daily. 04/16/18   York Spaniel, MD    Family History Family History  Problem Relation Age of Onset  . Heart attack Father   . Sarcoidosis Brother     Social History Social History   Tobacco Use  . Smoking status: Former Games developer  . Smokeless tobacco: Former Neurosurgeon    Types: Chew  . Tobacco comment: Quit about 30 years ago  Substance Use Topics  . Alcohol use: No  . Drug use: No     Allergies   Aspirin   Review of Systems  Review of Systems  Constitutional: Negative for chills and fever.  HENT: Negative for rhinorrhea.   Eyes: Negative for visual disturbance.  Respiratory: Positive for cough. Negative for chest tightness and shortness of breath.   Cardiovascular: Positive for chest pain. Negative for palpitations.  Gastrointestinal: Negative for nausea and vomiting.       +epigastric pain  Genitourinary: Negative for dysuria.  Musculoskeletal: Positive for back pain. Negative for myalgias.       +thoracic back pain  Skin: Negative for rash.  Neurological: Negative for dizziness, syncope, light-headedness and headaches.  All other  systems reviewed and are negative.    Physical Exam Updated Vital Signs BP (!) 171/99 (BP Location: Left Arm)   Pulse 86   Temp 98.9 F (37.2 C) (Oral)   Resp 18   Ht  (1.803 m)   Wt 83.9 kg (185 lb)   SpO2 100%   BMI 25.80 kg/m   Physical Exam  Constitutional: He appears well-developed and well-nourished. No distress.  HENT:  Head: Normocephalic and atraumatic.  Mouth/Throat: Oropharynx is clear and moist.  Eyes: Pupils are equal, round, and reactive to light. Conjunctivae and EOM are normal.  Neck: Normal range of motion. Neck supple.  Cardiovascular: Normal rate, regular rhythm, S1 normal and S2 normal.  No murmur heard. Pulses:      Radial pulses are 2+ on the right side, and 2+ on the left side.       Dorsalis pedis pulses are 2+ on the right side, and 1+ on the left side.  Pulmonary/Chest: Effort normal and breath sounds normal. He has no wheezes. He has no rales.  Abdominal: Soft. He exhibits no distension. There is no tenderness. There is no guarding.  Musculoskeletal: Normal range of motion. He exhibits no edema or deformity.  Thoracic back pain not reproducible on palpation.  Lymphadenopathy:    He has no cervical adenopathy.  Neurological: He is alert.  Cranial nerves grossly intact. Patient moves extremities symmetrically and with good coordination.  Skin: Skin is warm and dry. No rash noted. No erythema.  Psychiatric: He has a normal mood and affect. His behavior is normal. Judgment and thought content normal.  Nursing note and vitals reviewed.    ED Treatments / Results  Labs (all labs ordered are listed, but only abnormal results are displayed) Labs Reviewed  CBC WITH DIFFERENTIAL/PLATELET  BASIC METABOLIC PANEL  I-STAT TROPONIN, ED    EKG EKG Interpretation  Date/Time:  Tuesday May 20 2018 05:48:38 EDT Ventricular Rate:  71 PR Interval:  140 QRS Duration: 76 QT Interval:  354 QTC Calculation: 384 R Axis:   16 Text Interpretation:   Normal sinus rhythm Septal infarct , age undetermined Abnormal ECG When compared with ECG of 05/01/2011, Anteroseptal infarct , age undetermined is now present Confirmed by Dione Booze ([id number]) on 05/20/2018 5:51:30 AM   Radiology Dg Chest 2 View  Result Date: 05/20/2018 CLINICAL DATA:  Worsening heartburn for 2-3 weeks EXAM: CHEST - 2 VIEW COMPARISON:  04/30/2011 FINDINGS: The heart size and mediastinal contours are within normal limits. Both lungs are clear. The visualized skeletal structures are unremarkable. IMPRESSION: No active cardiopulmonary disease. Electronically Signed   By: Deatra Robinson M.D.   On: 05/20/2018 06:09   Ct Angio Chest/abd/pel For Dissection W And/or W/wo  Result Date: 05/20/2018 CLINICAL DATA:  Thoracic pain.  Chest pain with normal EKG. EXAM: CT ANGIOGRAPHY CHEST, ABDOMEN AND PELVIS TECHNIQUE: Multidetector CT imaging through the chest, abdomen and pelvis was  performed using the standard protocol during bolus administration of intravenous contrast. Multiplanar reconstructed images and MIPs were obtained and reviewed to evaluate the vascular anatomy. CONTRAST:  100 cc Isovue 370 intravenous COMPARISON:  Abdominal CT 04/08/2011 FINDINGS: CTA CHEST FINDINGS Cardiovascular: Noncontrast phase is negative for intramural aortic hematoma. When accounting for motion artifact there is no dissection flap. Diffuse atherosclerotic wall thickening of the aorta with mild plaque irregularity at the arch and proximal descending segment. The neighboring fat is not infiltrated. The descending thoracic aorta measures up to 3 cm in diameter. Coronary atherosclerotic calcification. Bilateral segmental and subsegmental pulmonary artery filling defects with 6 individual clots marked on series 7. Borderline heart size. Negative for right heart strain. Mediastinum/Nodes: Subcentimeter thyroid nodules. Negative for mediastinal adenopathy. Lungs/Pleura: There is no edema, consolidation, effusion, or  pneumothorax. Generalized airway thickening with occasional debris/mucous. Musculoskeletal: No acute or aggressive finding Review of the MIP images confirms the above findings. CTA ABDOMEN AND PELVIS FINDINGS VASCULAR Aorta: Diffuse atheromatous wall thickening with mural thrombus. Negative for dissection. Maximal diameter is distally at 2.4 cm. Celiac: Plaque at the origin without flow limiting stenosis. Negative for dissection. SMA: Atheromatous plaque, mainly noncalcified. Replaced right hepatic artery. No flow limiting stenosis or ulceration. Renals: Accessory left lower pole renal artery. There is atheromatous plaque without flow limiting stenosis or ulceration. IMA: Patent Inflow: Fusiform dilatation of the left common iliac artery measuring 2 cm, unchanged. No flow limiting stenosis or dissection Veins: Atherosclerosis without flow limiting stenosis. Review of the MIP images confirms the above findings. NON-VASCULAR Hepatobiliary: No focal liver abnormality.No evidence of biliary obstruction or stone. Pancreas: Unremarkable. Spleen: Unremarkable. Adrenals/Urinary Tract: Negative adrenals. No hydronephrosis or stone. Bilateral renal cysts. Unremarkable bladder. Stomach/Bowel:  No obstruction. No inflammatory changes. Lymphatic: Chronic calcified ileocolic lymph nodes attributed to remote granulomatous inflammation given stability. No calcified bowel mass is seen. Reproductive:Negative Other: No ascites or pneumoperitoneum. Musculoskeletal: Degenerative changes without acute or aggressive finding. Critical Value/emergent results were called by telephone at the time of interpretation on 05/20/2018 at 8:37 am to Dr. Aviva Kluver , who verbally acknowledged these results. Review of the MIP images confirms the above findings. IMPRESSION: 1. Multiple bilateral segmental and subsegmental acute pulmonary emboli. Negative for right heart strain. 2. No evidence of acute aortic syndrome. 3.  Aortic Atherosclerosis  (ICD10-I70.0).  Coronary atherosclerosis. 4. Bronchitic airway thickening of indeterminate chronicity. 5. 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 Electronically Signed   By: Marnee Spring M.D.   On: 05/20/2018 08:43    Procedures Procedures (including critical care time)  Medications Ordered in ED Medications  labetalol (NORMODYNE,TRANDATE) injection 10 mg (has no administration in time range)     Initial Impression / Assessment and Plan / ED Course  I have reviewed the triage vital signs and the nursing notes.  Pertinent labs & imaging results that were available during my care of the patient were reviewed by me and considered in my medical decision making (see chart for details).  Clinical Course as of May 20 850  Tue May 20, 2018  0639 R arm blood pressure 175/102 and left arm blood pressure 184/90, checked at bedside by me.  Patient is comfortable with reduced blood pressure at this time, therefore do not feel the patient needs to be started on a drip for hypertensive urgency/emergency.  This was discussed with Dr. Dione Booze.  Patient to receive 10 mg of labetalol and blood pressure to be rechecked. Will hold ASA due  to not clearly ischemic CP and r/o for thoracic aortic dissection.   [AM]  0746 Due to high contrast load, will order 500 ml NS after CT angio.   [AM]  [phone] Patient reassessed.  Pain 3 out of 10.   [AM]  [phone] Received call from Dr. Grace Isaac, radiology who reports that patient has multiple segmental and subsegmental PEs.  They were visualized with the contrast load from the angiogram.   [AM]    Clinical Course User Index [AM] Elisha Ponder, PA-C    Differential diagnosis includes ACS, PE, thoracic aortic dissection, Boerhaave's syndrome, cardiac tamponade, pneumothorax, incarcerated diaphragmatic hernia, cholecystitis, esophageal spasm, gastroesophageal reflux, herpes zoster of the thorax, pericarditis, pneumonia,  chest wall pain,  costochondritis.   Patient is nontoxic-appearing and in no acute distress.  On examination, patient reporting that his pain is down to 2.  Awaiting angiogram to rule out aortic dissection.  Patient does have a pulse differential of in upper extremities. Anticipate delta troponin on patient in admission for chest pain rule out provided CT angios is negative.  Also have suspicion for gastritis versus peptic ulcer disease as etiology of patient's epigastric pain, as patient is on 10 mg of prednisone daily for myasthenia gravis.  Patient CT angiogram is negative for dissection, will attempt GI cocktail. Patient does have a HEART score of 5.  Contrast load from CT angiogram demonstrates multiple segmental and subsegmental bilateral pulmonary emboli.  Patient remains hemodynamically stable, and there is no CT evidence of right heart strain per discussion with Dr. Grace Isaac.  Delta troponin obtained.  Patient to be admitted.  Heparin started.  9:02 AM Spoke with Toya Smothers, nurse practitioner of [facility] hospitalist to admit. After seeing patient, given hemodynamic stability of patient, and negative delta troponin, discharge is reasonable. Recommendations placed in chart. Will reassess patient and discuss. Appreciate consult and recommendations.   Note sent to Dr. Jacky Kindle in the electronic medical record detailing patient's evaluation today.  This is a shared visit with Dr. Dione Booze and Dr. Lincoln Brigham. Patient was independently evaluated by this attending physician. Attending physician consulted in evaluation and admission management.  Final Clinical Impressions(s) / ED Diagnoses   Final diagnoses:  Pulmonary embolism, bilateral (HCC)  Elevated blood pressure reading with diagnosis of hypertension  Hyperglycemia    ED Discharge Orders    None       Delia Chimes 05/20/18 1754    Dione Booze, MD 05/20/18 2252

## 2018-05-20 NOTE — ED Triage Notes (Signed)
Pt reports hx of GERD. Pt reports intermittent epigastric burning x2 weeks. Pt reports mid back pain. Pt denies nausea, sob, diaphoresis.

## 2018-05-20 NOTE — ED Notes (Signed)
Pt state he understands instructions and hme stable with wife via wc

## 2018-05-26 DIAGNOSIS — I10 Essential (primary) hypertension: Secondary | ICD-10-CM | POA: Diagnosis not present

## 2018-05-26 DIAGNOSIS — Z6825 Body mass index (BMI) 25.0-25.9, adult: Secondary | ICD-10-CM | POA: Diagnosis not present

## 2018-05-26 DIAGNOSIS — G7000 Myasthenia gravis without (acute) exacerbation: Secondary | ICD-10-CM | POA: Diagnosis not present

## 2018-05-26 DIAGNOSIS — I2699 Other pulmonary embolism without acute cor pulmonale: Secondary | ICD-10-CM | POA: Diagnosis not present

## 2018-05-29 DIAGNOSIS — G609 Hereditary and idiopathic neuropathy, unspecified: Secondary | ICD-10-CM | POA: Diagnosis not present

## 2018-05-29 DIAGNOSIS — I129 Hypertensive chronic kidney disease with stage 1 through stage 4 chronic kidney disease, or unspecified chronic kidney disease: Secondary | ICD-10-CM | POA: Diagnosis not present

## 2018-05-29 DIAGNOSIS — I700 Atherosclerosis of aorta: Secondary | ICD-10-CM | POA: Diagnosis not present

## 2018-05-29 DIAGNOSIS — E7849 Other hyperlipidemia: Secondary | ICD-10-CM | POA: Diagnosis not present

## 2018-05-29 DIAGNOSIS — N400 Enlarged prostate without lower urinary tract symptoms: Secondary | ICD-10-CM | POA: Diagnosis not present

## 2018-05-29 DIAGNOSIS — M1990 Unspecified osteoarthritis, unspecified site: Secondary | ICD-10-CM | POA: Diagnosis not present

## 2018-05-29 DIAGNOSIS — N182 Chronic kidney disease, stage 2 (mild): Secondary | ICD-10-CM | POA: Diagnosis not present

## 2018-05-29 DIAGNOSIS — E1140 Type 2 diabetes mellitus with diabetic neuropathy, unspecified: Secondary | ICD-10-CM | POA: Diagnosis not present

## 2018-05-29 DIAGNOSIS — E1151 Type 2 diabetes mellitus with diabetic peripheral angiopathy without gangrene: Secondary | ICD-10-CM | POA: Diagnosis not present

## 2018-05-29 DIAGNOSIS — G7000 Myasthenia gravis without (acute) exacerbation: Secondary | ICD-10-CM | POA: Diagnosis not present

## 2018-05-29 DIAGNOSIS — I10 Essential (primary) hypertension: Secondary | ICD-10-CM | POA: Diagnosis not present

## 2018-05-29 DIAGNOSIS — I2699 Other pulmonary embolism without acute cor pulmonale: Secondary | ICD-10-CM | POA: Diagnosis not present

## 2018-05-31 IMAGING — CT CT ANGIO CHEST-ABD-PELV FOR DISSECTION W/ AND WO/W CM
2 of 6 series · 14 of 46 positions shown, 16 images · IV contrast (APPLIED)
Comparison: Abdominal CT 04/08/2011

CLINICAL DATA: Thoracic pain.  Chest pain with normal EKG.

EXAM:
CT ANGIOGRAPHY CHEST, ABDOMEN AND PELVIS
TECHNIQUE: Multidetector CT imaging through the chest, abdomen and pelvis was
performed using the standard protocol during bolus administration of
intravenous contrast. Multiplanar reconstructed images and MIPs were
obtained and reviewed to evaluate the vascular anatomy.
CONTRAST:  100 cc Isovue 370 intravenous

[Series 7: arterial · axial · arterial · 0.70mm/px · z∈[+973,+1551]mm · 11 of 327 slices shown, 13 images]
[im 19/327  soft-tissue]
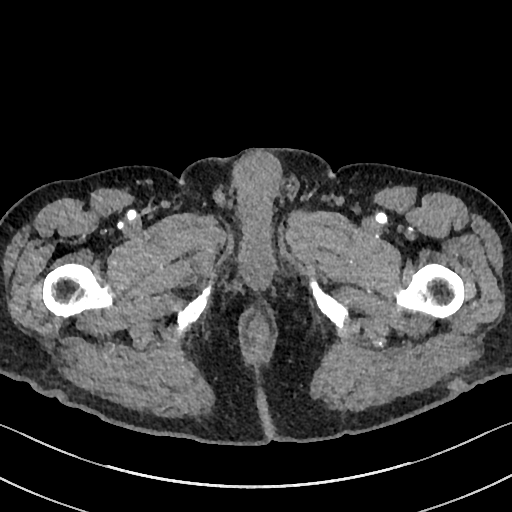
[im 19/327  bone]
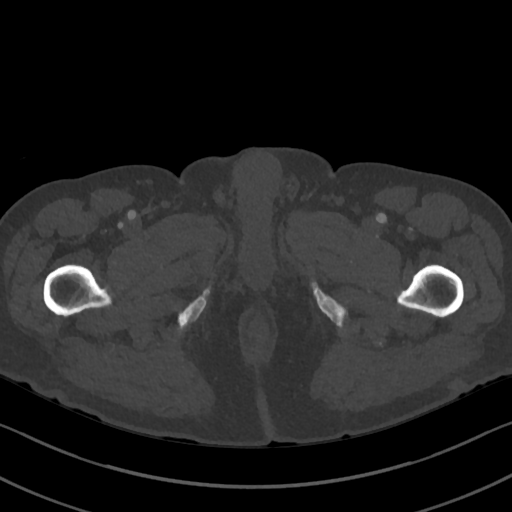
[im 55/327  soft-tissue]
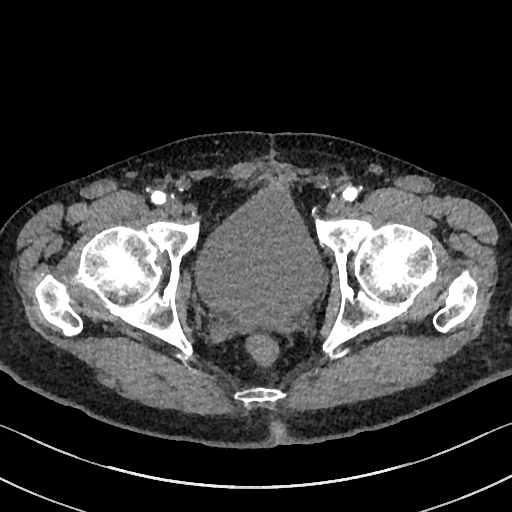
[im 73/327  soft-tissue]
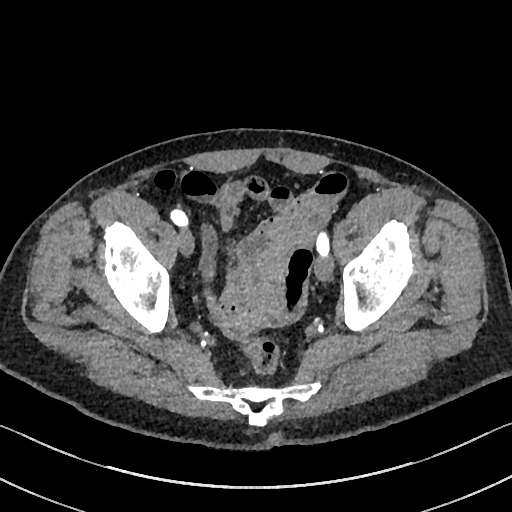
[im 109/327  soft-tissue]
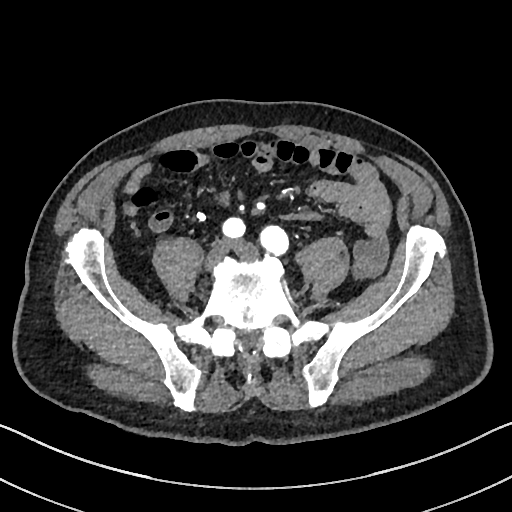
[im 127/327  soft-tissue]
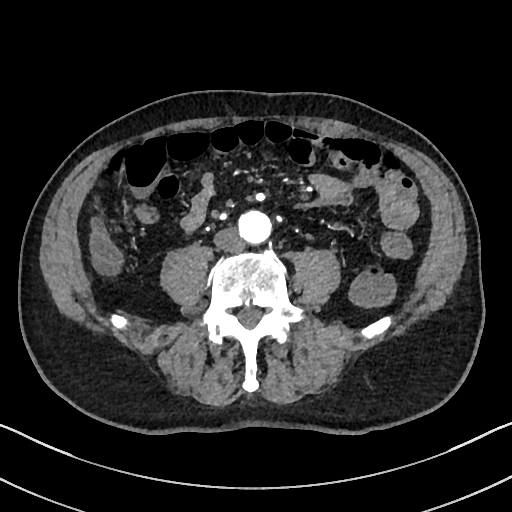
[im 164/327  soft-tissue]
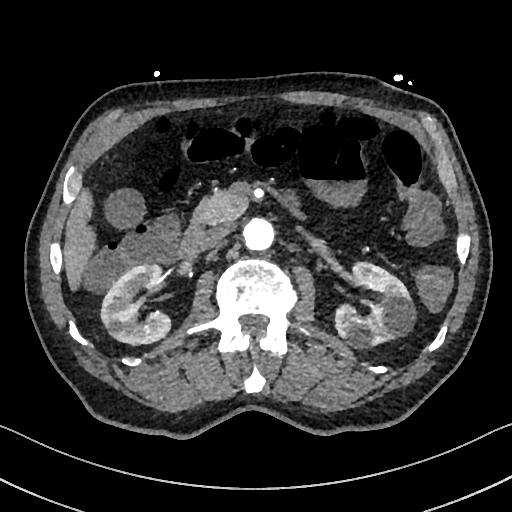
[im 200/327  soft-tissue]
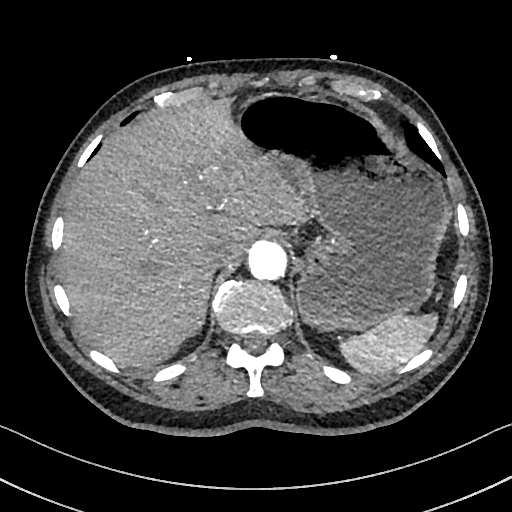
[im 218/327  soft-tissue]
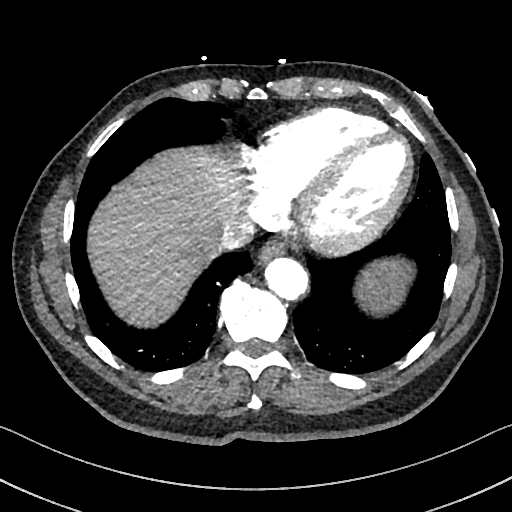
[im 254/327  soft-tissue]
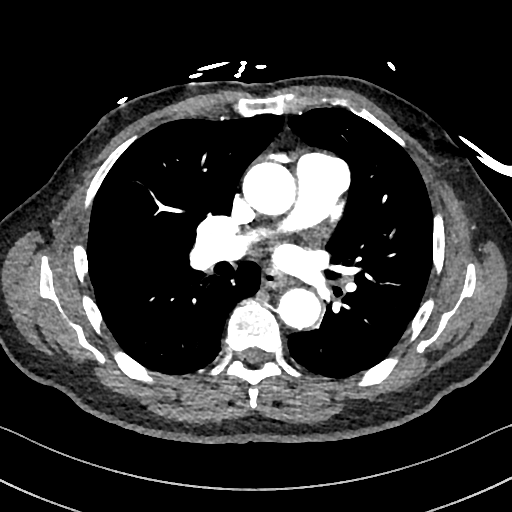
[im 254/327  bone]
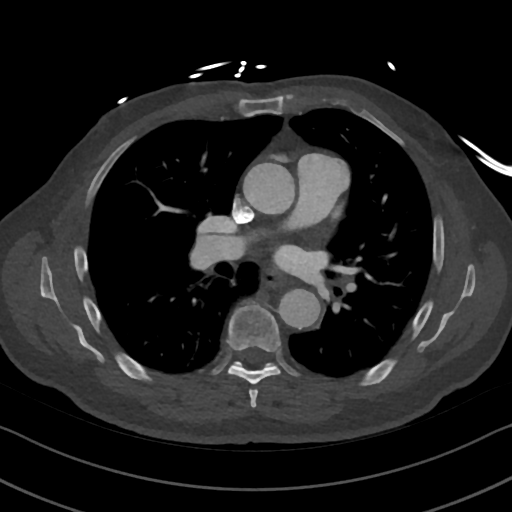
[im 272/327  soft-tissue]
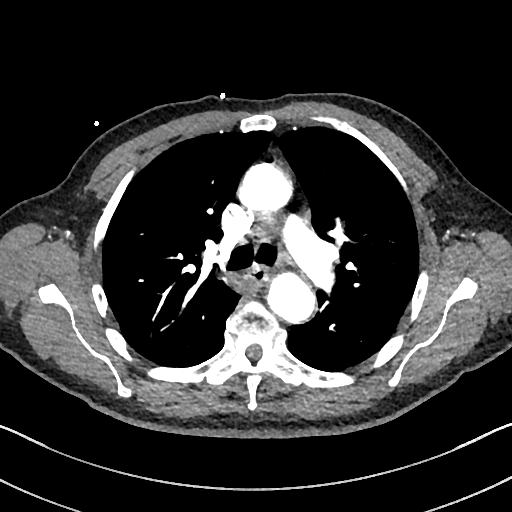
[im 308/327  soft-tissue]
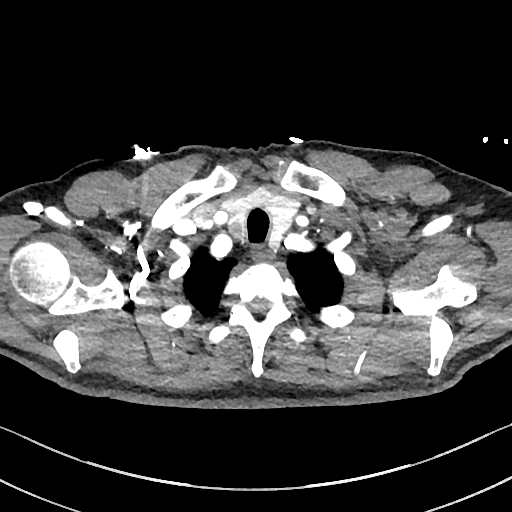

[Series 18: mpr coronal · coronal · 0.93mm/px · 3 of 150 slices shown]
[im 38/150  soft-tissue]
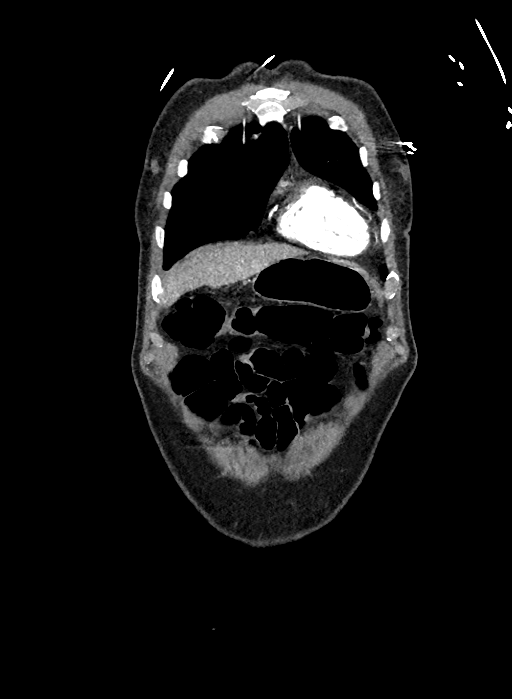
[im 75/150  soft-tissue]
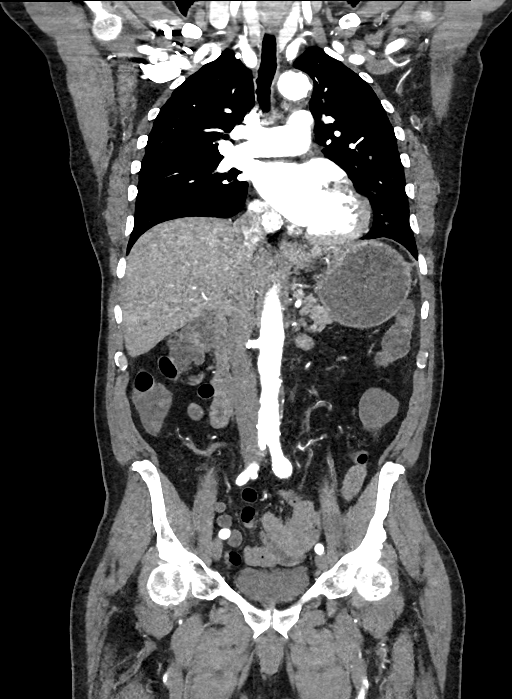
[im 112/150  soft-tissue]
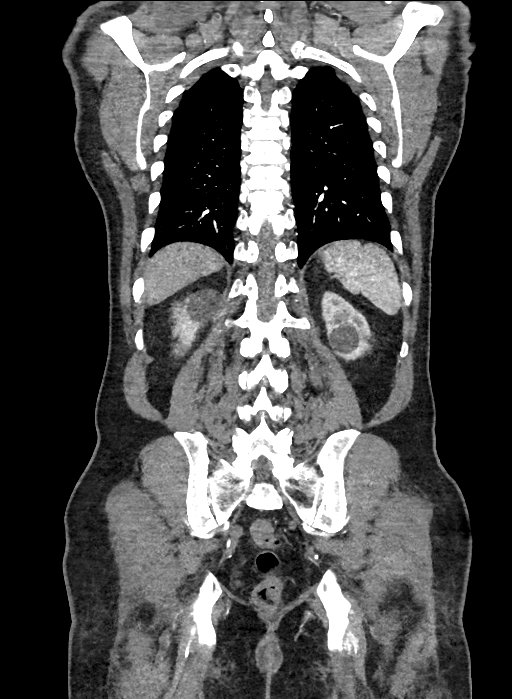

[14 of 46 positions shown; findings below may reference images not displayed]

FINDINGS: CTA CHEST FINDINGS

Cardiovascular: Noncontrast phase is negative for intramural aortic
hematoma. When accounting for motion artifact there is no dissection
flap. Diffuse atherosclerotic wall thickening of the aorta with mild
plaque irregularity at the arch and proximal descending segment. The
neighboring fat is not infiltrated. The descending thoracic aorta
measures up to 3 cm in diameter. Coronary atherosclerotic
calcification.

Bilateral segmental and subsegmental pulmonary artery filling
defects with 6 individual clots marked on series 7. Borderline heart
size. Negative for right heart strain.

Mediastinum/Nodes: Subcentimeter thyroid nodules. Negative for
mediastinal adenopathy.

Lungs/Pleura: There is no edema, consolidation, effusion, or
pneumothorax. Generalized airway thickening with occasional
debris/mucous.

Musculoskeletal: No acute or aggressive finding

Review of the MIP images confirms the above findings.

CTA ABDOMEN AND PELVIS FINDINGS

VASCULAR

Aorta: Diffuse atheromatous wall thickening with mural thrombus.
Negative for dissection. Maximal diameter is distally at 2.4 cm.

Celiac: Plaque at the origin without flow limiting stenosis.
Negative for dissection.

SMA: Atheromatous plaque, mainly noncalcified. Replaced right
hepatic artery. No flow limiting stenosis or ulceration.

Renals: Accessory left lower pole renal artery. There is
atheromatous plaque without flow limiting stenosis or ulceration.

IMA: Patent

Inflow: Fusiform dilatation of the left common iliac artery
measuring 2 cm, unchanged. No flow limiting stenosis or dissection

Veins: Atherosclerosis without flow limiting stenosis.

Review of the MIP images confirms the above findings.

NON-VASCULAR

Hepatobiliary: No focal liver abnormality.No evidence of biliary
obstruction or stone.

Pancreas: Unremarkable.

Spleen: Unremarkable.

Adrenals/Urinary Tract: Negative adrenals. No hydronephrosis or
stone. Bilateral renal cysts. Unremarkable bladder.

Stomach/Bowel:  No obstruction. No inflammatory changes.

Lymphatic: Chronic calcified ileocolic lymph nodes attributed to
remote granulomatous inflammation given stability. No calcified
bowel mass is seen.

Reproductive:Negative

Other: No ascites or pneumoperitoneum.

Musculoskeletal: Degenerative changes without acute or aggressive
finding.

Critical Value/emergent results were called by telephone at the time
of interpretation on 05/20/2018 at [DATE] to Dr. ISIMELI LEONARD , who
verbally acknowledged these results.

Review of the MIP images confirms the above findings.
IMPRESSION: 1. Multiple bilateral segmental and subsegmental acute pulmonary
emboli. Negative for right heart strain.
2. No evidence of acute aortic syndrome.
3.  Aortic Atherosclerosis ([id number]).  Coronary atherosclerosis.
4. Bronchitic airway thickening of indeterminate chronicity.
5. Left common iliac artery aneurysm measuring 2 cm, unchanged from

## 2018-05-31 IMAGING — DX DG CHEST 2V
2 series · 2 of 2 positions shown · non-contrast
Comparison: 04/30/2011

CLINICAL DATA: Worsening heartburn for 2-3 weeks

EXAM:
CHEST - 2 VIEW

[chest pa]
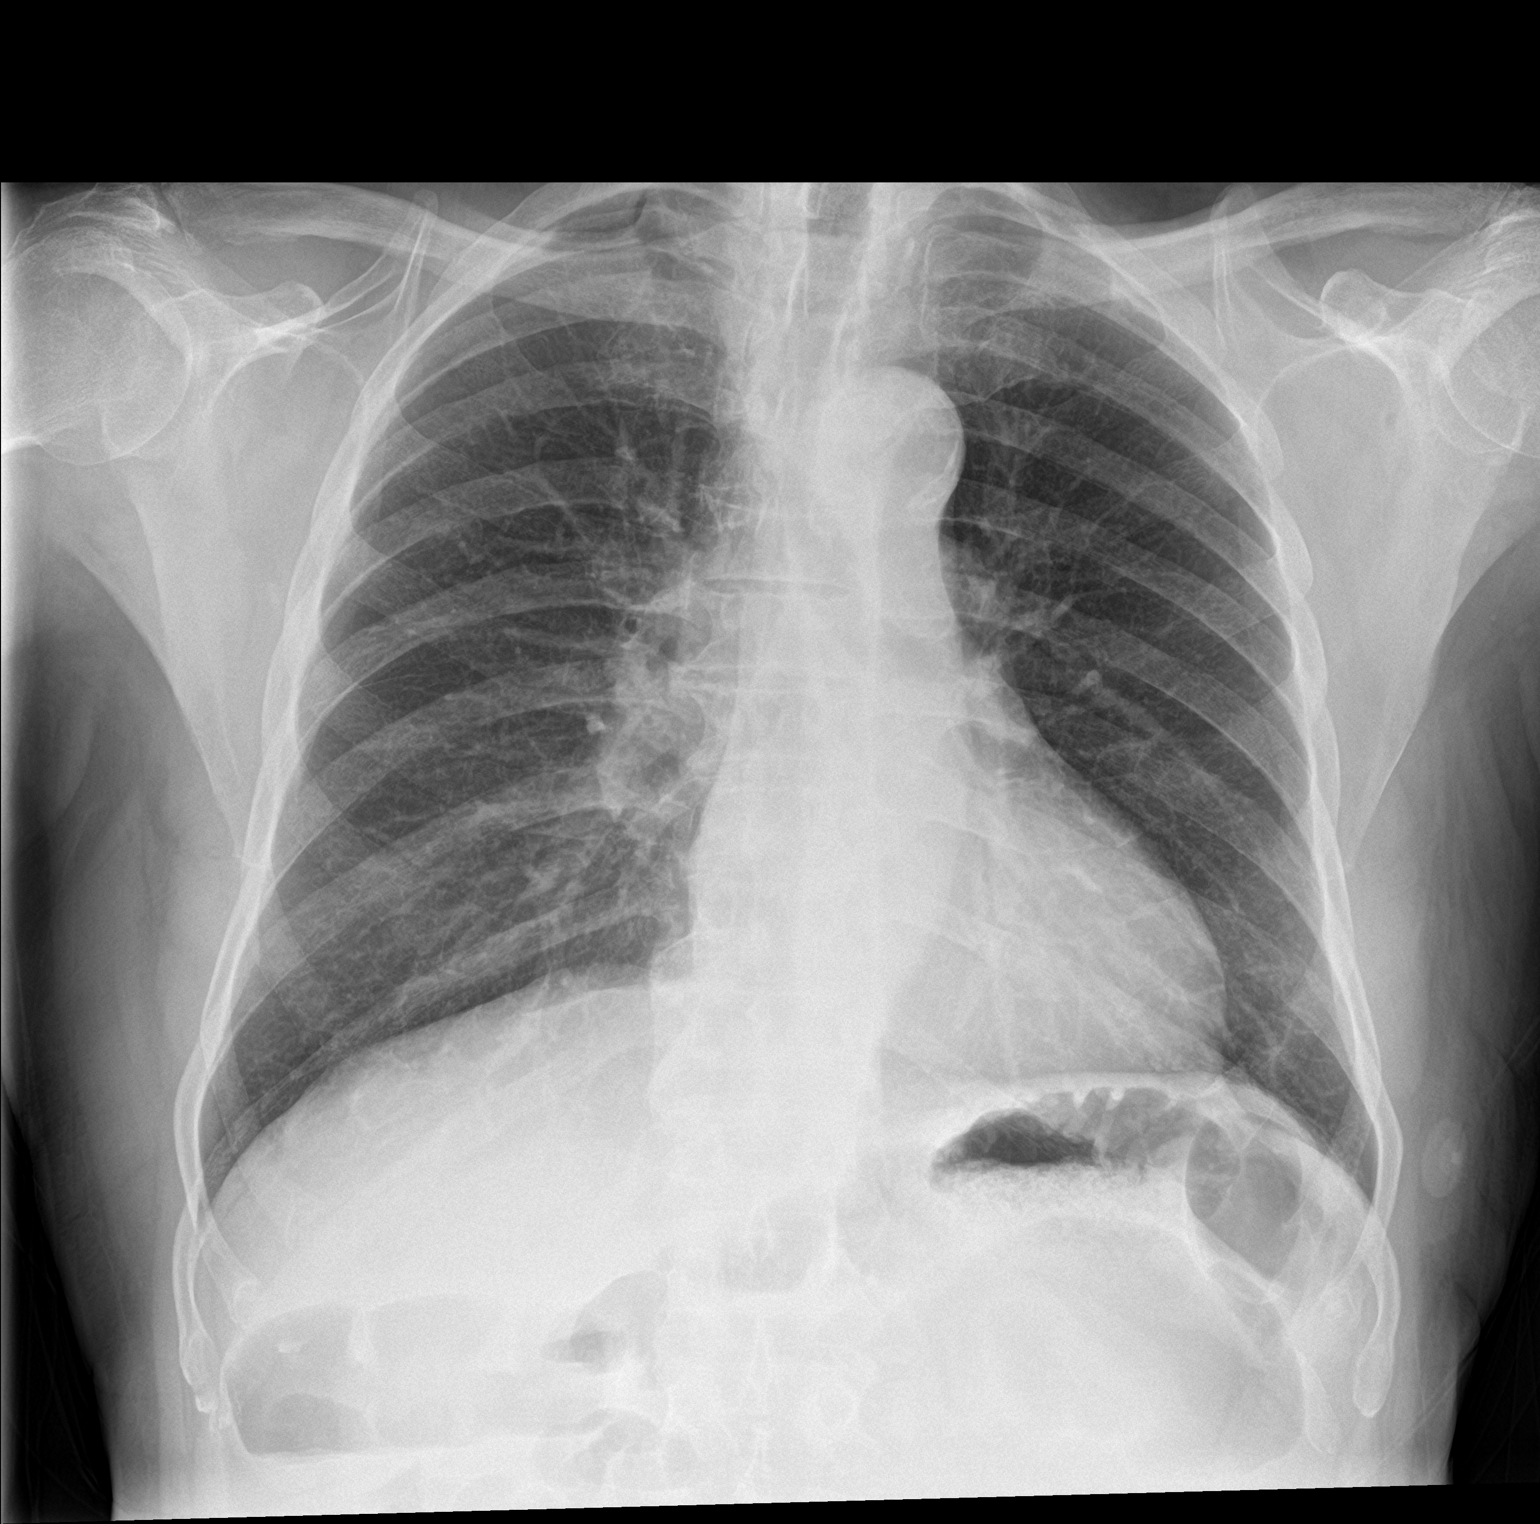

[chest lat]
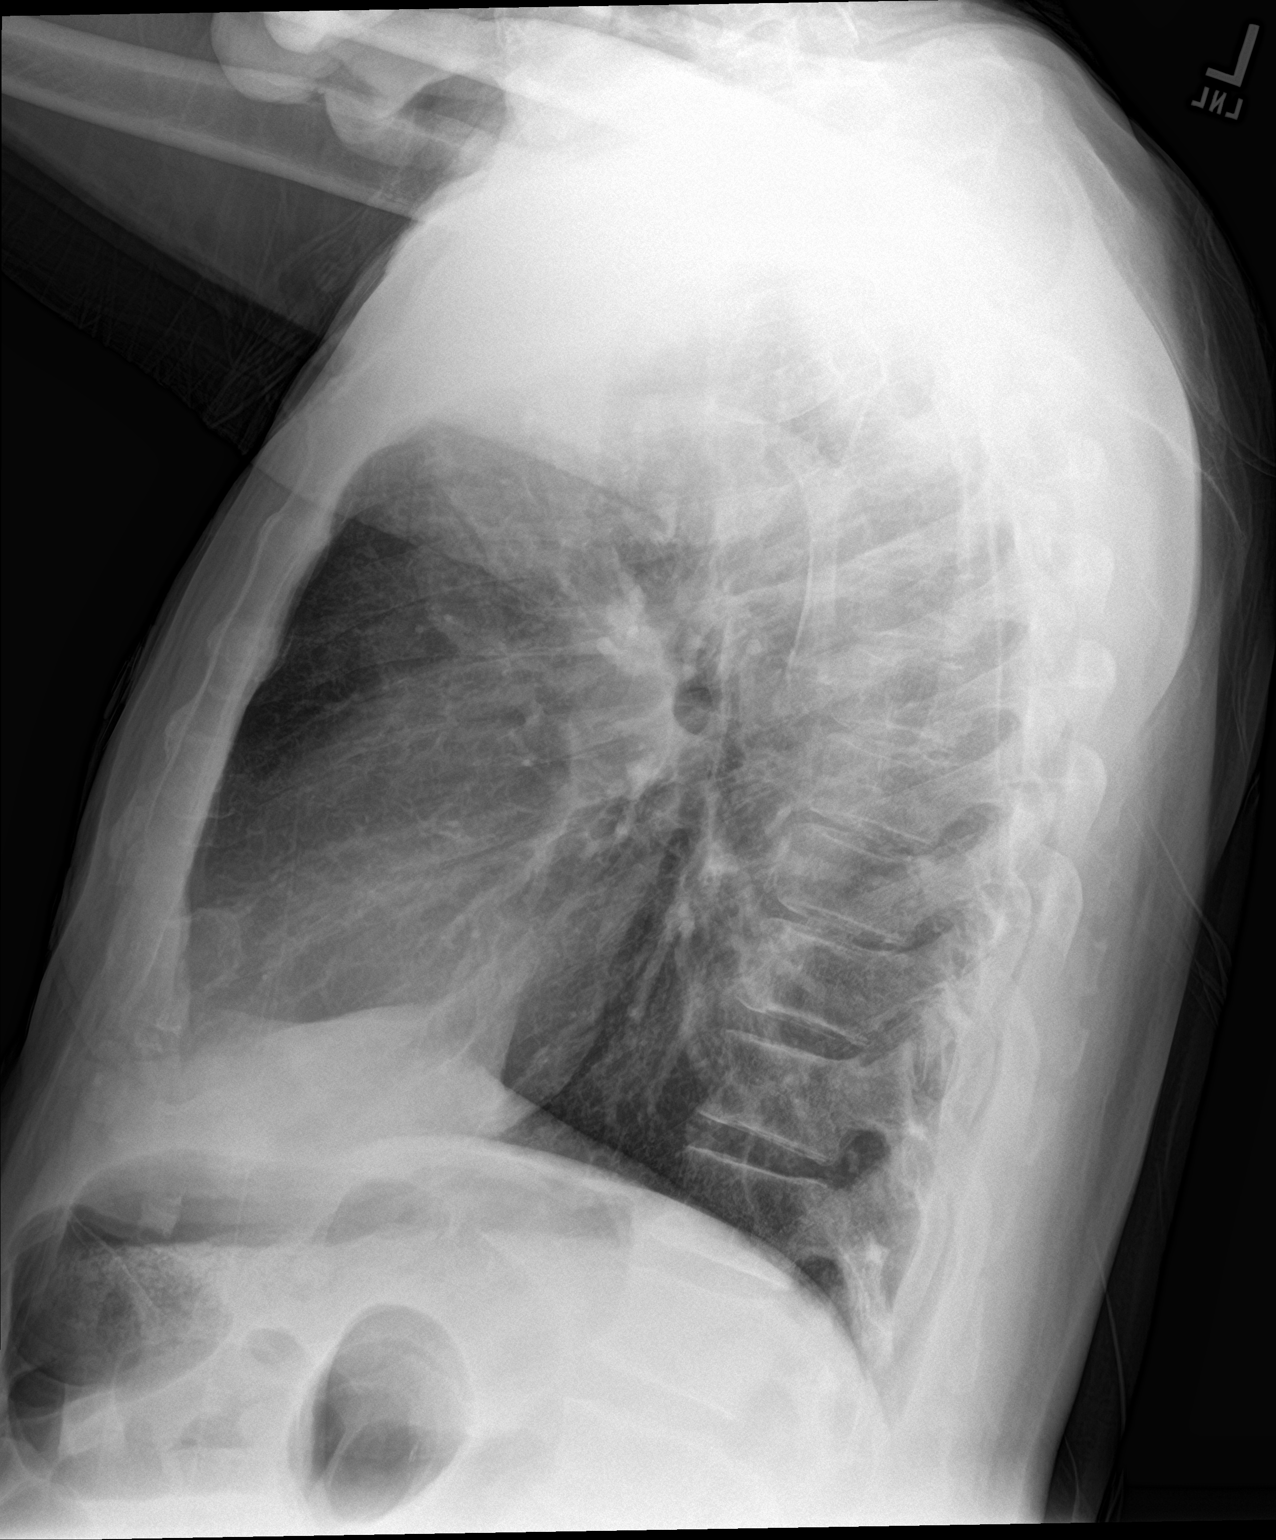

[2 of 2 positions shown; findings below may reference images not displayed]

FINDINGS: The heart size and mediastinal contours are within normal limits.
Both lungs are clear. The visualized skeletal structures are
unremarkable.
IMPRESSION: No active cardiopulmonary disease.

## 2018-06-11 DIAGNOSIS — E7849 Other hyperlipidemia: Secondary | ICD-10-CM | POA: Diagnosis not present

## 2018-06-11 DIAGNOSIS — Z125 Encounter for screening for malignant neoplasm of prostate: Secondary | ICD-10-CM | POA: Diagnosis not present

## 2018-06-11 DIAGNOSIS — I129 Hypertensive chronic kidney disease with stage 1 through stage 4 chronic kidney disease, or unspecified chronic kidney disease: Secondary | ICD-10-CM | POA: Diagnosis not present

## 2018-06-11 DIAGNOSIS — R82998 Other abnormal findings in urine: Secondary | ICD-10-CM | POA: Diagnosis not present

## 2018-06-11 DIAGNOSIS — E1140 Type 2 diabetes mellitus with diabetic neuropathy, unspecified: Secondary | ICD-10-CM | POA: Diagnosis not present

## 2018-06-18 DIAGNOSIS — M542 Cervicalgia: Secondary | ICD-10-CM | POA: Diagnosis not present

## 2018-06-18 DIAGNOSIS — G608 Other hereditary and idiopathic neuropathies: Secondary | ICD-10-CM | POA: Diagnosis not present

## 2018-06-18 DIAGNOSIS — Z6826 Body mass index (BMI) 26.0-26.9, adult: Secondary | ICD-10-CM | POA: Diagnosis not present

## 2018-06-18 DIAGNOSIS — I2699 Other pulmonary embolism without acute cor pulmonale: Secondary | ICD-10-CM | POA: Diagnosis not present

## 2018-06-18 DIAGNOSIS — E1140 Type 2 diabetes mellitus with diabetic neuropathy, unspecified: Secondary | ICD-10-CM | POA: Diagnosis not present

## 2018-06-18 DIAGNOSIS — I700 Atherosclerosis of aorta: Secondary | ICD-10-CM | POA: Diagnosis not present

## 2018-06-18 DIAGNOSIS — E1151 Type 2 diabetes mellitus with diabetic peripheral angiopathy without gangrene: Secondary | ICD-10-CM | POA: Diagnosis not present

## 2018-06-18 DIAGNOSIS — E1129 Type 2 diabetes mellitus with other diabetic kidney complication: Secondary | ICD-10-CM | POA: Diagnosis not present

## 2018-06-18 DIAGNOSIS — N400 Enlarged prostate without lower urinary tract symptoms: Secondary | ICD-10-CM | POA: Diagnosis not present

## 2018-06-18 DIAGNOSIS — I10 Essential (primary) hypertension: Secondary | ICD-10-CM | POA: Diagnosis not present

## 2018-06-18 DIAGNOSIS — E7849 Other hyperlipidemia: Secondary | ICD-10-CM | POA: Diagnosis not present

## 2018-06-18 DIAGNOSIS — Z0000 Encounter for general adult medical examination without abnormal findings: Secondary | ICD-10-CM | POA: Diagnosis not present

## 2018-06-20 DIAGNOSIS — Z1212 Encounter for screening for malignant neoplasm of rectum: Secondary | ICD-10-CM | POA: Diagnosis not present

## 2018-07-23 DIAGNOSIS — H2513 Age-related nuclear cataract, bilateral: Secondary | ICD-10-CM | POA: Diagnosis not present

## 2018-09-17 DIAGNOSIS — J301 Allergic rhinitis due to pollen: Secondary | ICD-10-CM | POA: Diagnosis not present

## 2018-09-17 DIAGNOSIS — H903 Sensorineural hearing loss, bilateral: Secondary | ICD-10-CM | POA: Diagnosis not present

## 2018-10-01 ENCOUNTER — Emergency Department (HOSPITAL_COMMUNITY): Payer: Medicare Other

## 2018-10-01 ENCOUNTER — Emergency Department (HOSPITAL_COMMUNITY)
Admission: EM | Admit: 2018-10-01 | Discharge: 2018-10-02 | Disposition: A | Discharge status: Home / Self Care | Payer: Medicare Other | Attending: Emergency Medicine | Admitting: Emergency Medicine

## 2018-10-01 ENCOUNTER — Other Ambulatory Visit: Payer: Self-pay

## 2018-10-01 ENCOUNTER — Encounter (HOSPITAL_COMMUNITY): Payer: Self-pay | Admitting: Emergency Medicine

## 2018-10-01 DIAGNOSIS — Z87891 Personal history of nicotine dependence: Secondary | ICD-10-CM | POA: Diagnosis not present

## 2018-10-01 DIAGNOSIS — Z7901 Long term (current) use of anticoagulants: Secondary | ICD-10-CM | POA: Diagnosis not present

## 2018-10-01 DIAGNOSIS — R002 Palpitations: Secondary | ICD-10-CM | POA: Diagnosis not present

## 2018-10-01 DIAGNOSIS — Z79899 Other long term (current) drug therapy: Secondary | ICD-10-CM | POA: Insufficient documentation

## 2018-10-01 DIAGNOSIS — I2510 Atherosclerotic heart disease of native coronary artery without angina pectoris: Secondary | ICD-10-CM | POA: Diagnosis not present

## 2018-10-01 DIAGNOSIS — Z7982 Long term (current) use of aspirin: Secondary | ICD-10-CM | POA: Diagnosis not present

## 2018-10-01 DIAGNOSIS — R079 Chest pain, unspecified: Secondary | ICD-10-CM | POA: Diagnosis not present

## 2018-10-01 DIAGNOSIS — I10 Essential (primary) hypertension: Secondary | ICD-10-CM | POA: Diagnosis not present

## 2018-10-01 LAB — BASIC METABOLIC PANEL
Anion gap: 5 (ref 5–15)
BUN: 12 mg/dL (ref 8–23)
CALCIUM: 9.5 mg/dL (ref 8.9–10.3)
CO2: 31 mmol/L (ref 22–32)
CREATININE: 1.21 mg/dL (ref 0.61–1.24)
Chloride: 105 mmol/L (ref 98–111)
GFR calc non Af Amer: 53 mL/min — ABNORMAL LOW (ref >60)
GLUCOSE: 119 mg/dL — AB (ref 70–99)
Potassium: 3.8 mmol/L (ref 3.5–5.1)
SODIUM: 141 mmol/L (ref 135–145)

## 2018-10-01 LAB — I-STAT TROPONIN, ED: Troponin i, poc: 0.01 ng/mL (ref 0.00–0.08)

## 2018-10-01 LAB — CBC
HCT: 35.3 % — ABNORMAL LOW (ref 39.0–52.0)
Hemoglobin: 11.2 g/dL — ABNORMAL LOW (ref 13.0–17.0)
MCH: 29.8 pg (ref 26.0–34.0)
MCHC: 31.7 g/dL (ref 30.0–36.0)
MCV: 93.9 fL (ref 80.0–100.0)
PLATELETS: 304 10*3/uL (ref 150–400)
RBC: 3.76 MIL/uL — ABNORMAL LOW (ref 4.22–5.81)
RDW: 14.3 % (ref 11.5–15.5)
WBC: 4 10*3/uL (ref 4.0–10.5)
nRBC: 0 % (ref 0.0–0.2)

## 2018-10-01 NOTE — ED Triage Notes (Signed)
Pt reports intermittent palpitations over the last few days. Pt was recently placed on Xarelto for a PE. Pt also reports loose stools over last 6 months. Pt reports heartburn like gas pain in his chest that radiates to between his shoulders. Hx of HTN and GERD.

## 2018-10-02 ENCOUNTER — Emergency Department (HOSPITAL_COMMUNITY): Payer: Medicare Other

## 2018-10-02 DIAGNOSIS — R079 Chest pain, unspecified: Secondary | ICD-10-CM | POA: Diagnosis not present

## 2018-10-02 DIAGNOSIS — R002 Palpitations: Secondary | ICD-10-CM | POA: Diagnosis not present

## 2018-10-02 LAB — URINALYSIS, ROUTINE W REFLEX MICROSCOPIC
Bacteria, UA: NONE SEEN
Bilirubin Urine: NEGATIVE
GLUCOSE, UA: NEGATIVE mg/dL
Hgb urine dipstick: NEGATIVE
Ketones, ur: NEGATIVE mg/dL
Leukocytes, UA: NEGATIVE
NITRITE: NEGATIVE
PH: 6 (ref 5.0–8.0)
Protein, ur: 100 mg/dL — AB
SPECIFIC GRAVITY, URINE: 1.019 (ref 1.005–1.030)

## 2018-10-02 LAB — I-STAT TROPONIN, ED: TROPONIN I, POC: 0 ng/mL (ref 0.00–0.08)

## 2018-10-02 MED ORDER — SODIUM CHLORIDE 0.9 % IV BOLUS (SEPSIS): 1000.0000 mL | Freq: Once | INTRAVENOUS | Status: DC

## 2018-10-02 MED ORDER — ONDANSETRON HCL 4 MG/2ML IJ SOLN: 4.0000 mg | Freq: Once | INTRAMUSCULAR | Status: DC

## 2018-10-02 MED ORDER — SODIUM CHLORIDE 0.9 % IV BOLUS (SEPSIS): 500.0000 mL | Freq: Once | INTRAVENOUS | Status: DC

## 2018-10-02 MED ORDER — SODIUM CHLORIDE 0.9 % IV BOLUS (SEPSIS)
500.0000 mL | Freq: Once | INTRAVENOUS | Status: AC
  Administered 2018-10-02: 500 mL via INTRAVENOUS

## 2018-10-02 MED ORDER — HYDROMORPHONE HCL 1 MG/ML IJ SOLN: 1.0000 mg | Freq: Once | INTRAMUSCULAR | Status: DC

## 2018-10-02 MED ORDER — IOPAMIDOL (ISOVUE-370) INJECTION 76%
100.0000 mL | Freq: Once | INTRAVENOUS | Status: AC | PRN
  Administered 2018-10-02: 100 mL via INTRAVENOUS

## 2018-10-02 NOTE — ED Provider Notes (Signed)
TIME SEEN: 1:27 AM  CHIEF COMPLAINT: Palpitations  HPI: Patient is an 84 year old male with history of PE on Xarelto, hypertension, CAD who presents to the emergency department with complaints of intermittent palpitations and lightheadedness for the past 2 days.  Feels like he has a "fluttering" in his chest.  Has had heartburn as well but no tightness or pressure.  No shortness of breath, nausea vomiting, diaphoresis.  States he has missed several doses of his Xarelto and is having some discomfort in his lateral ribs that he feels is "gas" but this feels similar to when he had his PE.  No numbness, tingling or focal weakness.  No fall.  ROS: See HPI Constitutional: no fever  Eyes: no drainage  ENT: no runny nose   Cardiovascular:  no chest pain  Resp: no SOB  GI: no vomiting GU: no dysuria Integumentary: no rash  Allergy: no hives  Musculoskeletal: no leg swelling  Neurological: no slurred speech ROS otherwise negative  PAST MEDICAL HISTORY/PAST SURGICAL HISTORY:  Past Medical History:  Diagnosis Date  . Aortic atherosclerosis (HCC)   . Chest pain   . Coronary atherosclerosis   . GERD (gastroesophageal reflux disease)   . Hypertension   . Iliac artery aneurysm (HCC)   . Kidney stone   . Myasthenia gravis (HCC) 03/29/2017  . Pulmonary embolism (HCC)     MEDICATIONS:  Prior to Admission medications   Medication Sig Start Date End Date Taking? Authorizing Provider  aspirin EC 81 MG tablet Take 81 mg by mouth daily.    [provider]  lisinopril (PRINIVIL,ZESTRIL) 10 MG tablet Take 10 mg by mouth daily.    [provider]  pravastatin (PRAVACHOL) 20 MG tablet Take 20 mg by mouth daily.    [provider]  predniSONE (DELTASONE) 10 MG tablet Take 1 tablet (10 mg total) by mouth daily with breakfast. 05/01/18   York Spaniel, MD  Rivaroxaban 15 & 20 MG TBPK Take as directed on package: Start with one 15mg  tablet by mouth twice a day with food. On Day  22, switch to one 20mg  tablet once a day with food. 05/20/18   Aviva Kluver B, PA-C    ALLERGIES:  Allergies  Allergen Reactions  . Aspirin     REACTION: full strength; causes gi irritation    SOCIAL HISTORY:  Social History   Tobacco Use  . Smoking status: Former Games developer  . Smokeless tobacco: Former Neurosurgeon    Types: Chew  . Tobacco comment: Quit about 30 years ago  Substance Use Topics  . Alcohol use: No    FAMILY HISTORY: Family History  Problem Relation Age of Onset  . Heart attack Father   . Sarcoidosis Brother     EXAM: BP (!) 162/74 (BP Location: Left Arm)   Pulse 61   Temp 98.5 F (36.9 C) (Oral)   Resp 14   Ht 5\' 11"  (1.803 m)   Wt 81.6 kg   SpO2 99%   BMI 25.10 kg/m  CONSTITUTIONAL: Alert and oriented and responds appropriately to questions. Well-appearing; well-nourished HEAD: Normocephalic EYES: Conjunctivae clear, pupils appear equal, EOMI ENT: normal nose; moist mucous membranes NECK: Supple, no meningismus, no nuchal rigidity, no LAD  CARD: RRR; S1 and S2 appreciated; no murmurs, no clicks, no rubs, no gallops RESP: Normal chest excursion without splinting or tachypnea; breath sounds clear and equal bilaterally; no wheezes, no rhonchi, no rales, no hypoxia or respiratory distress, speaking full sentences ABD/GI: Normal bowel sounds; non-distended;  soft, non-tender, no rebound, no guarding, no peritoneal signs, no hepatosplenomegaly BACK:  The back appears normal and is non-tender to palpation, there is no CVA tenderness EXT: Normal ROM in all joints; non-tender to palpation; no edema; normal capillary refill; no cyanosis, no calf tenderness or swelling    SKIN: Normal color for age and race; warm; no rash NEURO: Moves all extremities equally PSYCH: The patient's mood and manner are appropriate. Grooming and personal hygiene are appropriate.  MEDICAL DECISION MAKING: Patient here with intermittent dizziness and palpitations.  Currently in a sinus  rhythm with no ischemic changes on EKG.  First troponin negative.  Normal electrolytes and hemoglobin.  Will give 500 mL IV fluid for symptomatic relief.  Will obtain second troponin.  His major concern is that he could have another pulmonary embolus.  Will obtain CTA of his chest given symptoms similar to when he presented with PE before and he has missed several doses of Xarelto.  Chest x-ray today is clear.  ED PROGRESS: Patient second troponin is negative.  CTA of his chest shows no acute abnormality, no pulmonary embolus.  I have advised him to continue his Xarelto until he is instructed to stop by his primary care provider.  He has not had any cardiac events on the cardiac monitor.  No further palpitations.  His episodes of dizziness only last for several seconds.  I feel he is safe to be discharged and follow-up with his primary care physician.  Patient and wife are comfortable with this plan.  We will also give outpatient cardiology follow-up if palpitations continue.   At this time, I do not feel there is any life-threatening condition present. I have reviewed and discussed all results (EKG, imaging, lab, urine as appropriate) and exam findings with patient/family. I have reviewed nursing notes and appropriate previous records.  I feel the patient is safe to be discharged home without further emergent workup and can continue workup as an outpatient as needed. Discussed usual and customary return precautions. Patient/family verbalize understanding and are comfortable with this plan.  Outpatient follow-up has been provided if needed. All questions have been answered.      EKG Interpretation  Date/Time:  Wednesday October 01 2018 21:59:54 EDT Ventricular Rate:  63 PR Interval:  146 QRS Duration: 74 QT Interval:  378 QTC Calculation: 386 R Axis:   53 Text Interpretation:  Normal sinus rhythm Septal infarct , age undetermined Abnormal ECG No significant change since last tracing Confirmed by  Ward, Baxter Hire [phone]) on 10/02/2018 1:23:20 AM         Ward, Layla Maw, DO 10/02/18 [id number]

## 2018-10-02 NOTE — ED Notes (Signed)
Patient transported to CT 

## 2018-10-03 DIAGNOSIS — I10 Essential (primary) hypertension: Secondary | ICD-10-CM | POA: Diagnosis not present

## 2018-10-03 DIAGNOSIS — I2699 Other pulmonary embolism without acute cor pulmonale: Secondary | ICD-10-CM | POA: Diagnosis not present

## 2018-10-03 DIAGNOSIS — Z6825 Body mass index (BMI) 25.0-25.9, adult: Secondary | ICD-10-CM | POA: Diagnosis not present

## 2018-10-13 IMAGING — CT CT ANGIO CHEST
2 of 6 series · 19 of 36 positions shown · IV contrast (iopamidol)
Comparison: CTA chest 05/20/2018

CLINICAL DATA: Chest pain with palpitations.

EXAM:
CT ANGIOGRAPHY CHEST WITH CONTRAST
TECHNIQUE: Multidetector CT imaging of the chest was performed using the
standard protocol during bolus administration of intravenous
contrast. Multiplanar CT image reconstructions and MIPs were
obtained to evaluate the vascular anatomy.
CONTRAST:  100mL C602PT-2R6 IOPAMIDOL (C602PT-2R6) INJECTION 76%

[Series 8: pe thins · axial · 0.71mm/px · z∈[+1108,+1391]mm · 18 of 450 slices shown]
[im 23/450  lung]
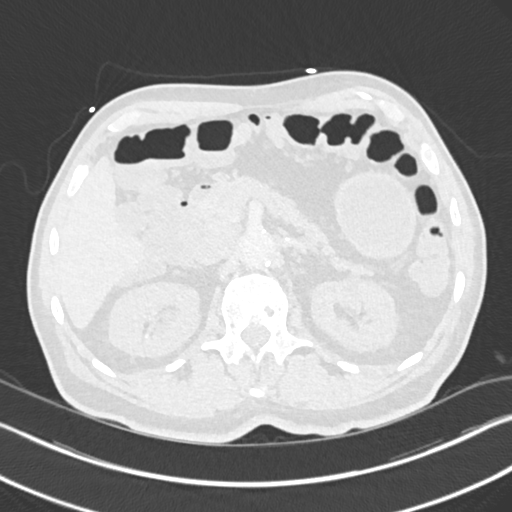
[im 45/450  mediastinal]
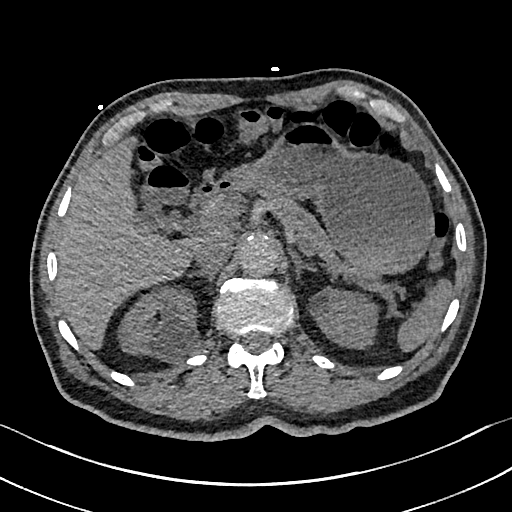
[im 68/450  lung]
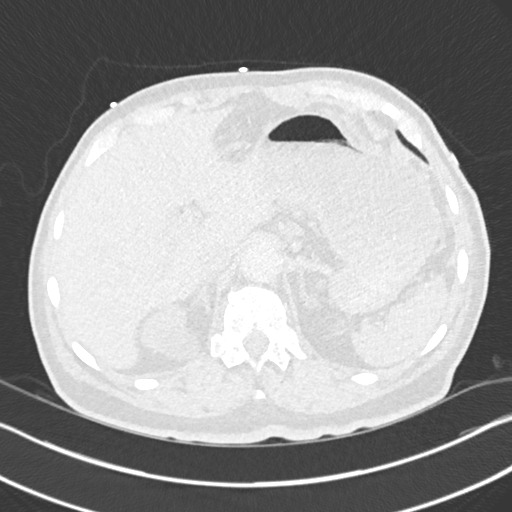
[im 90/450  mediastinal]
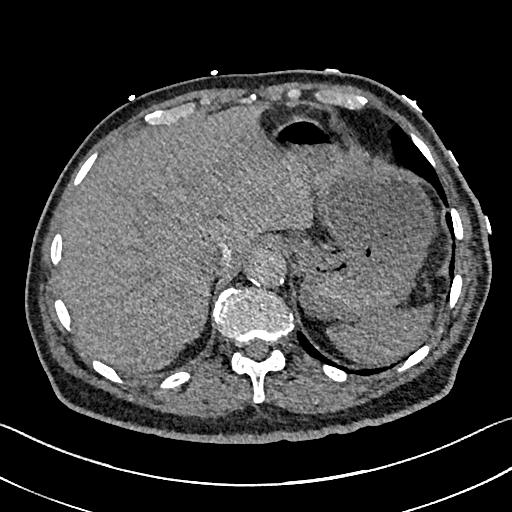
[im 113/450  lung]
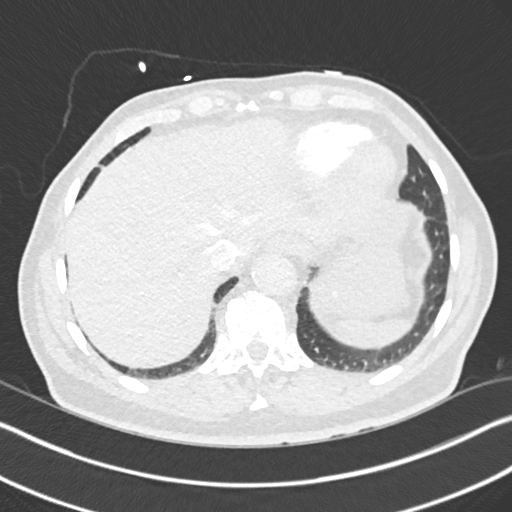
[im 135/450  mediastinal]
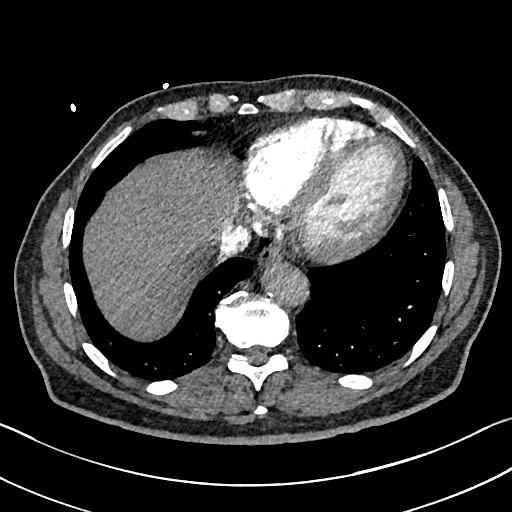
[im 158/450  lung]
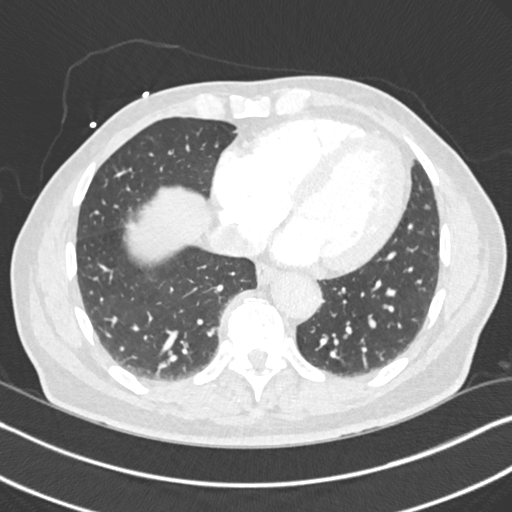
[im 180/450  mediastinal]
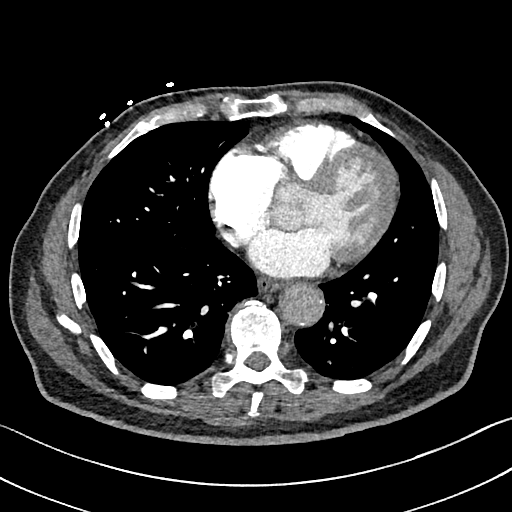
[im 203/450  lung]
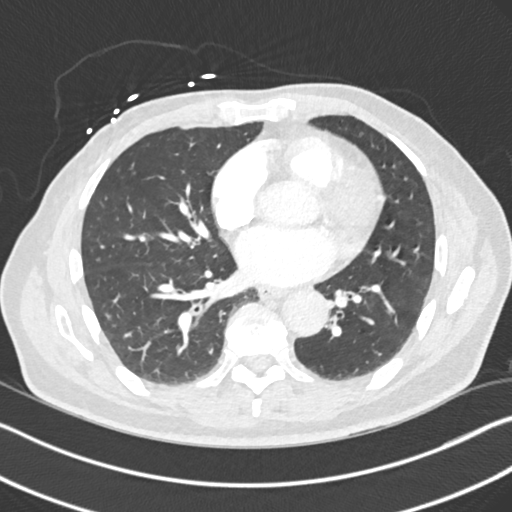
[im 247/450  mediastinal]
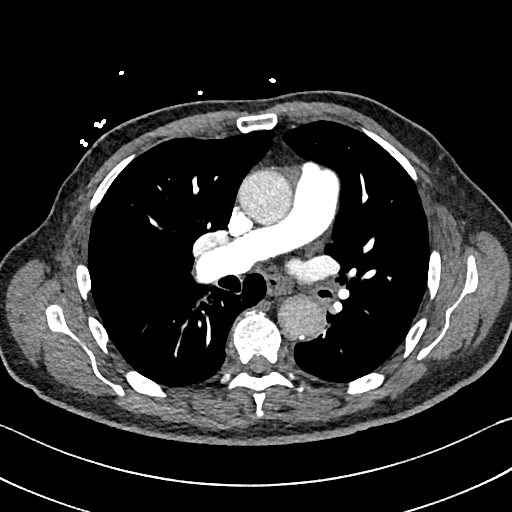
[im 270/450  lung]
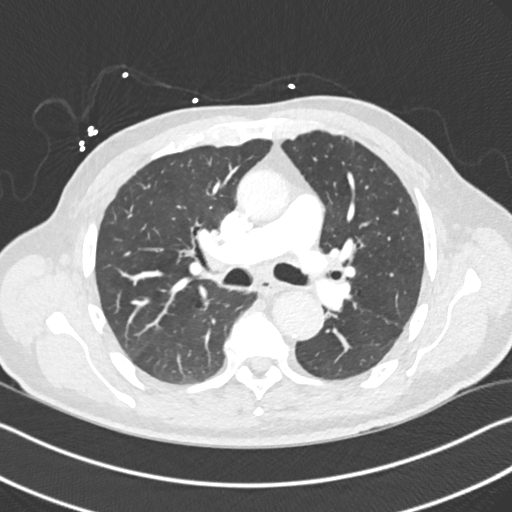
[im 292/450  mediastinal]
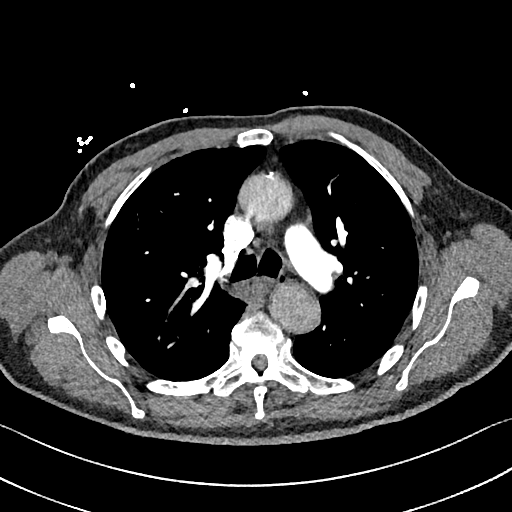
[im 315/450  lung]
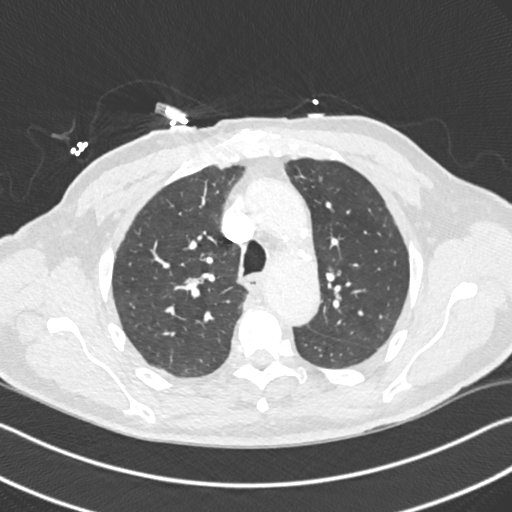
[im 337/450  mediastinal]
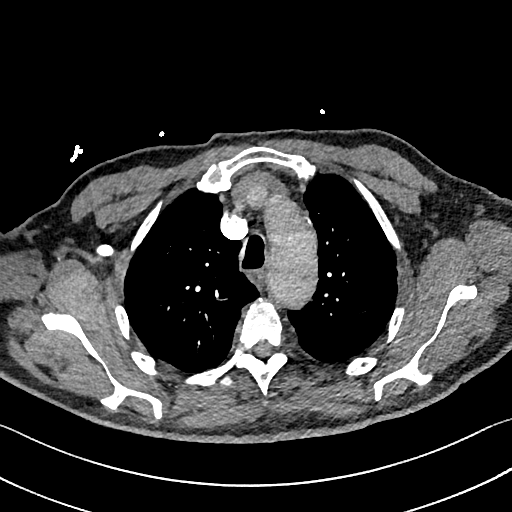
[im 360/450  lung]
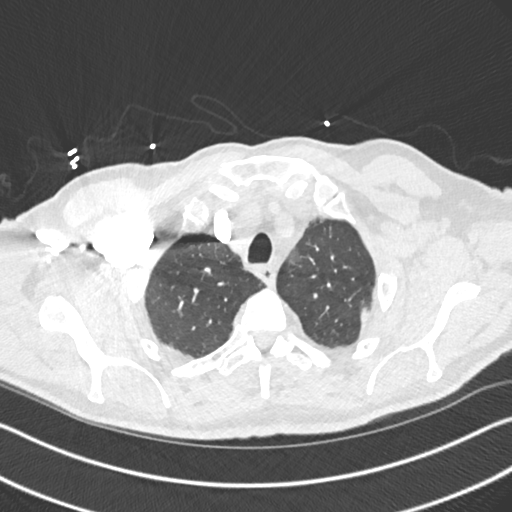
[im 382/450  mediastinal]
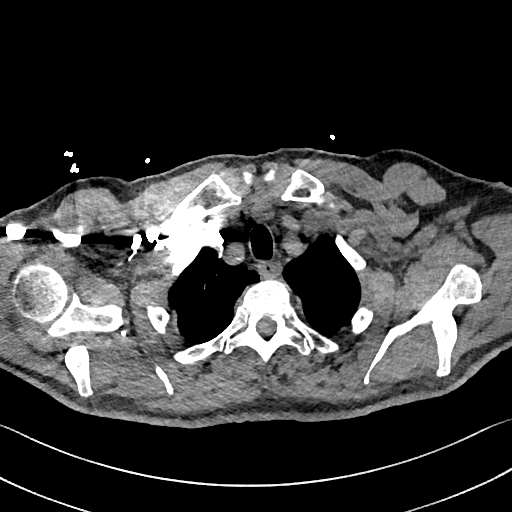
[im 405/450  lung]
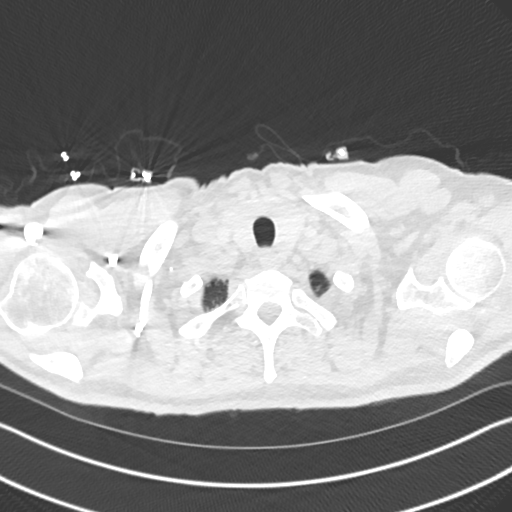
[im 427/450  mediastinal]
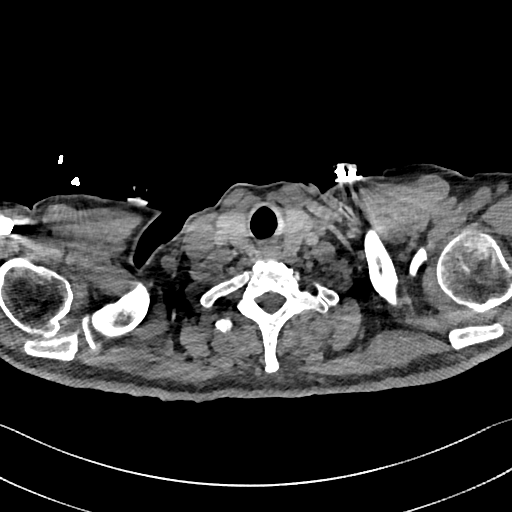

[Series 9: pe 2mm cor · coronal · 0.62mm/px · 1 of 130 slices shown]
[im 65/130  mediastinal]
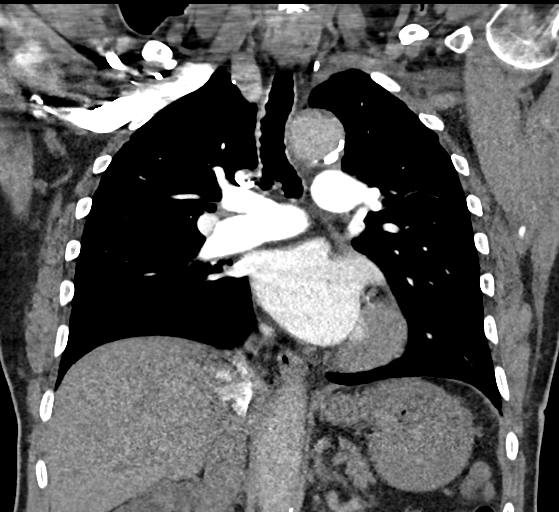

[19 of 36 positions shown; findings below may reference images not displayed]

FINDINGS: Cardiovascular:

--Pulmonary arteries: Contrast injection is sufficient to
demonstrate satisfactory opacification of the pulmonary arteries to
the segmental level. There is no pulmonary embolus. The main
pulmonary artery is within normal limits for size.

--Aorta: Limited opacification of the aorta due to bolus timing
optimization for the pulmonary arteries. Conventional 3 vessel
aortic branching pattern. The aortic course and caliber are normal.
There is moderate aortic atherosclerosis.

--Heart: Mild cardiomegaly. No pericardial effusion.

Mediastinum/Nodes: No mediastinal, hilar or axillary
lymphadenopathy. The visualized thyroid and thoracic esophageal
course are unremarkable.

Lungs/Pleura: No pulmonary nodules or masses. No pleural effusion or
pneumothorax. No focal airspace consolidation. No focal pleural
abnormality.

Upper Abdomen: Contrast bolus timing is not optimized for evaluation
of the abdominal organs. Within this limitation, the visualized
organs of the upper abdomen are normal. There are bilateral renal
cysts, measuring up to 3.5 cm.

Musculoskeletal: No chest wall abnormality. No acute or significant
osseous findings.

Review of the MIP images confirms the above findings.
IMPRESSION: 1. No pulmonary embolus or other acute thoracic abnormality.
2. Mild cardiomegaly and aortic Atherosclerosis ([id number]).

## 2018-10-20 DIAGNOSIS — I700 Atherosclerosis of aorta: Secondary | ICD-10-CM | POA: Diagnosis not present

## 2018-10-20 DIAGNOSIS — I129 Hypertensive chronic kidney disease with stage 1 through stage 4 chronic kidney disease, or unspecified chronic kidney disease: Secondary | ICD-10-CM | POA: Diagnosis not present

## 2018-10-20 DIAGNOSIS — G7000 Myasthenia gravis without (acute) exacerbation: Secondary | ICD-10-CM | POA: Diagnosis not present

## 2018-10-20 DIAGNOSIS — E1151 Type 2 diabetes mellitus with diabetic peripheral angiopathy without gangrene: Secondary | ICD-10-CM | POA: Diagnosis not present

## 2018-10-20 DIAGNOSIS — I2699 Other pulmonary embolism without acute cor pulmonale: Secondary | ICD-10-CM | POA: Diagnosis not present

## 2018-10-20 DIAGNOSIS — E7849 Other hyperlipidemia: Secondary | ICD-10-CM | POA: Diagnosis not present

## 2018-10-20 DIAGNOSIS — N182 Chronic kidney disease, stage 2 (mild): Secondary | ICD-10-CM | POA: Diagnosis not present

## 2018-10-20 DIAGNOSIS — E1140 Type 2 diabetes mellitus with diabetic neuropathy, unspecified: Secondary | ICD-10-CM | POA: Diagnosis not present

## 2018-10-20 DIAGNOSIS — G609 Hereditary and idiopathic neuropathy, unspecified: Secondary | ICD-10-CM | POA: Diagnosis not present

## 2018-10-20 DIAGNOSIS — Z23 Encounter for immunization: Secondary | ICD-10-CM | POA: Diagnosis not present

## 2018-10-20 DIAGNOSIS — E1129 Type 2 diabetes mellitus with other diabetic kidney complication: Secondary | ICD-10-CM | POA: Diagnosis not present

## 2018-10-20 DIAGNOSIS — I10 Essential (primary) hypertension: Secondary | ICD-10-CM | POA: Diagnosis not present

## 2018-12-31 ENCOUNTER — Encounter: Payer: Self-pay | Admitting: Neurology

## 2018-12-31 ENCOUNTER — Ambulatory Visit (INDEPENDENT_AMBULATORY_CARE_PROVIDER_SITE_OTHER): Payer: Medicare Other | Admitting: Neurology

## 2018-12-31 VITALS — BP 150/76 | HR 65 | Ht 71.0 in | Wt 179.0 lb

## 2018-12-31 DIAGNOSIS — G7000 Myasthenia gravis without (acute) exacerbation: Secondary | ICD-10-CM | POA: Diagnosis not present

## 2018-12-31 NOTE — Progress Notes (Signed)
Reason for visit: Ocular myasthenia gravis  Thomas Seepersad. is an 85 y.o. male  History of present illness:  [name] is an 85 year old right-handed white male with a history of double vision dating back 7 or 8 years.  The patient has seronegative myasthenia gravis, the diagnosis was made at [facility] by single-fiber EMG.  The patient however has never responded to Mestinon.  A brief trial on prednisone in May 2019 again offered no benefit at a 10 mg dose.  The patient had a pulmonary embolism about 3 weeks after starting the prednisone.  The patient stopped the prednisone in August 2019, again he never gained benefit with the drug.  He has been on anticoagulant therapy since May 2019.  The patient tried prisms about 8 years ago, they seemed to work for several weeks but then would become ineffective.  The patient has not tried prisms since that time.  The patient generally will note double vision when looking to the left or when looking up.  He has difficulty with depth perception when driving.  He returns to this office for an evaluation.  Past Medical History:  Diagnosis Date  . Aortic atherosclerosis (HCC)   . Chest pain   . Coronary atherosclerosis   . GERD (gastroesophageal reflux disease)   . Hypertension   . Iliac artery aneurysm (HCC)   . Kidney stone   . Myasthenia gravis (HCC) 03/29/2017  . Pulmonary embolism [facility])     Past Surgical History:  Procedure Laterality Date  . KNEE SURGERY    . PROSTATE SURGERY      Family History  Problem Relation Age of Onset  . Heart attack Father   . Sarcoidosis Brother     Social history:  reports that he has quit smoking. He has quit using smokeless tobacco.  His smokeless tobacco use included chew. He reports that he does not drink alcohol or use drugs.    Allergies  Allergen Reactions  . Aspirin     REACTION: full strength; causes gi irritation    Medications:  Prior to Admission medications   Medication Sig Start  Date End Date Taking? Authorizing Provider  lisinopril (PRINIVIL,ZESTRIL) 10 MG tablet Take 10 mg by mouth daily.   Yes [provider]  pravastatin (PRAVACHOL) 20 MG tablet Take 20 mg by mouth daily.   Yes [provider]  Rivaroxaban 15 & 20 MG TBPK Take as directed on package: Start with one 15mg  tablet by mouth twice a day with food. On Day 22, switch to one 20mg  tablet once a day with food. Patient taking differently: Take 20 mg by mouth daily.  05/20/18  Yes [place] Scrape, Alyssa B, PA-C  predniSONE (DELTASONE) 10 MG tablet Take 1 tablet (10 mg total) by mouth daily with breakfast. Patient not taking: Reported on 12/31/2018 05/01/18   York Spaniel, MD    ROS:  Out of a complete 14 system review of symptoms, the patient complains only of the following symptoms, and all other reviewed systems are negative.  Chills Runny nose Eye itching, light sensitivity, double vision, loss of vision, blurred vision Wheezing Cold intolerance Diarrhea Snoring Memory loss, dizziness, facial drooping Itching  Blood pressure (!) 150/76, pulse 65, height 5\' 11"  (1.803 m), weight 179 lb (81.2 kg).  Physical Exam  General: The patient is alert and cooperative at the time of the examination.  Skin: No significant peripheral edema is noted.   Neurologic Exam  Mental status: The patient is  alert and oriented x 3 at the time of the examination. The patient has apparent normal recent and remote memory, with an apparently normal attention span and concentration ability.   Cranial nerves: Facial symmetry is present. Speech is normal, no aphasia or dysarthria is noted. Extraocular movements are full, with exception of incomplete adduction of the right eye with leftward gaze. Visual fields are full.  Motor: The patient has good strength in all 4 extremities.  Sensory examination: Soft touch sensation is symmetric on the face, arms, and legs.  Coordination: The patient has good  finger-nose-finger and heel-to-shin bilaterally.  Gait and station: The patient has a normal gait. Tandem gait is normal. Romberg is negative. No drift is seen.  Reflexes: Deep tendon reflexes are symmetric.   Assessment/Plan:  1.  Double vision, ocular myasthenia gravis  The patient appears to have fixed weakness with adduction of the right eye with leftward gaze.  Theoretically, a prism in the glasses should be effective for him, he may retry this.  I would not continue any medical therapy for his double vision at this time.  The patient will follow-up to this office if needed.  Marlan Palau MD 12/31/2018 10:06 AM  [facility] [address] [place], [place] [address]  Phone [phone] Fax [phone]

## 2019-02-20 DIAGNOSIS — Z1331 Encounter for screening for depression: Secondary | ICD-10-CM | POA: Diagnosis not present

## 2019-02-20 DIAGNOSIS — I2699 Other pulmonary embolism without acute cor pulmonale: Secondary | ICD-10-CM | POA: Diagnosis not present

## 2019-02-20 DIAGNOSIS — G609 Hereditary and idiopathic neuropathy, unspecified: Secondary | ICD-10-CM | POA: Diagnosis not present

## 2019-02-20 DIAGNOSIS — N182 Chronic kidney disease, stage 2 (mild): Secondary | ICD-10-CM | POA: Diagnosis not present

## 2019-02-20 DIAGNOSIS — G7000 Myasthenia gravis without (acute) exacerbation: Secondary | ICD-10-CM | POA: Diagnosis not present

## 2019-02-20 DIAGNOSIS — I129 Hypertensive chronic kidney disease with stage 1 through stage 4 chronic kidney disease, or unspecified chronic kidney disease: Secondary | ICD-10-CM | POA: Diagnosis not present

## 2019-02-20 DIAGNOSIS — E7849 Other hyperlipidemia: Secondary | ICD-10-CM | POA: Diagnosis not present

## 2019-02-20 DIAGNOSIS — E1140 Type 2 diabetes mellitus with diabetic neuropathy, unspecified: Secondary | ICD-10-CM | POA: Diagnosis not present

## 2019-02-20 DIAGNOSIS — I700 Atherosclerosis of aorta: Secondary | ICD-10-CM | POA: Diagnosis not present

## 2019-02-20 DIAGNOSIS — E1129 Type 2 diabetes mellitus with other diabetic kidney complication: Secondary | ICD-10-CM | POA: Diagnosis not present

## 2019-02-20 DIAGNOSIS — E1151 Type 2 diabetes mellitus with diabetic peripheral angiopathy without gangrene: Secondary | ICD-10-CM | POA: Diagnosis not present

## 2019-05-01 ENCOUNTER — Other Ambulatory Visit: Payer: Self-pay

## 2019-05-01 ENCOUNTER — Ambulatory Visit
Admission: RE | Admit: 2019-05-01 | Discharge: 2019-05-01 | Disposition: A | Discharge status: Home / Self Care | Payer: Medicare Other | Source: Ambulatory Visit | Attending: Internal Medicine | Admitting: Internal Medicine

## 2019-05-01 ENCOUNTER — Other Ambulatory Visit: Payer: Self-pay | Admitting: Internal Medicine

## 2019-05-01 DIAGNOSIS — I2699 Other pulmonary embolism without acute cor pulmonale: Secondary | ICD-10-CM | POA: Diagnosis not present

## 2019-05-01 DIAGNOSIS — R634 Abnormal weight loss: Secondary | ICD-10-CM

## 2019-05-01 DIAGNOSIS — R109 Unspecified abdominal pain: Secondary | ICD-10-CM

## 2019-05-01 DIAGNOSIS — E1151 Type 2 diabetes mellitus with diabetic peripheral angiopathy without gangrene: Secondary | ICD-10-CM | POA: Diagnosis not present

## 2019-05-01 MED ORDER — IOPAMIDOL (ISOVUE-300) INJECTION 61%
100.0000 mL | Freq: Once | INTRAVENOUS | Status: AC | PRN
  Administered 2019-05-01: 100 mL via INTRAVENOUS

## 2019-05-25 ENCOUNTER — Telehealth: Payer: Self-pay

## 2019-05-25 DIAGNOSIS — Z1211 Encounter for screening for malignant neoplasm of colon: Secondary | ICD-10-CM | POA: Diagnosis not present

## 2019-05-25 DIAGNOSIS — Z1212 Encounter for screening for malignant neoplasm of rectum: Secondary | ICD-10-CM | POA: Diagnosis not present

## 2019-05-25 NOTE — Telephone Encounter (Signed)
Lm on vm regarding phone screening 

## 2019-05-26 ENCOUNTER — Encounter: Payer: Self-pay | Admitting: Internal Medicine

## 2019-05-26 ENCOUNTER — Other Ambulatory Visit: Payer: Self-pay

## 2019-05-26 ENCOUNTER — Ambulatory Visit (INDEPENDENT_AMBULATORY_CARE_PROVIDER_SITE_OTHER): Payer: Medicare Other | Admitting: Internal Medicine

## 2019-05-26 DIAGNOSIS — R634 Abnormal weight loss: Secondary | ICD-10-CM | POA: Diagnosis not present

## 2019-05-26 DIAGNOSIS — R197 Diarrhea, unspecified: Secondary | ICD-10-CM

## 2019-05-26 DIAGNOSIS — Z86010 Personal history of colonic polyps: Secondary | ICD-10-CM

## 2019-05-26 NOTE — Patient Instructions (Signed)
1.  Discussed with them the likely nature of his problems.  Recommended observation at this point.  Advised to contact this office should he have recurrent issues or questions.    2.  No role for colonoscopy  3.  Resume general medical care with Dr. Jacky Kindle

## 2019-05-26 NOTE — Telephone Encounter (Signed)
Lm regarding screening

## 2019-05-26 NOTE — Progress Notes (Signed)
HISTORY OF PRESENT ILLNESS:  [name]. 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  He has not been seen since 2010.  He presents today for a telehealth medicine visit during the coronavirus pandemic at the request of his primary care provider Dr. Jacky Gillespie regarding problems with diarrhea and weight loss.  He is accompanied by his wife.  I am told that he began to develop loose bowels in February 2020.  Associated with this was decreased appetite and weight loss.  He also experienced abdominal cramping prior to defecation.  No bleeding.  Was experiencing increased intestinal gas.  He was evaluated by Dr. Lanell Gillespie nurse practitioner May 01, 2019.  I have reviewed that encounter.  For his symptoms he was placed on PPI and probiotic.  As part of his work-up he underwent laboratory blood work.  I have reviewed this.  Comprehensive metabolic panel including liver tests and albumin were normal.  Normal protein.  Unremarkable CBC with hemoglobin 12.1.  He also underwent contrast-enhanced CT scan of the abdomen and pelvis.  No significant abnormalities.  They were advised to schedule his appointment.  Patient and his wife report that over the past 2 weeks he has had no further issues with diarrhea.  His appetite has improved and he has gained 6 pounds.  GI review of systems is otherwise entirely negative.  He stopped his PPI.  I am not sure why, but they did tell me that they submitted Cologuard testing (which would not be recommended in an 85 year old who is aged out of a surveillance colonoscopy program and is on an oral anticoagulant).  Patient remains quite active  REVIEW OF SYSTEMS:  All non-GI ROS negative unless otherwise stated in the HPI.  Past Medical History:  Diagnosis Date  . Aortic atherosclerosis (HCC)   . Chest pain   . Coronary atherosclerosis   . GERD (gastroesophageal reflux disease)   .  Hypertension   . Iliac artery aneurysm (HCC)   . Kidney stone   . Myasthenia gravis (HCC) 03/29/2017  . Pulmonary embolism [facility])     Past Surgical History:  Procedure Laterality Date  . KNEE SURGERY    . PROSTATE SURGERY      Social History [name].  reports that he has quit smoking. He has quit using smokeless tobacco.  His smokeless tobacco use included chew. He reports that he does not drink alcohol or use drugs.  family history includes Heart attack in his father; Sarcoidosis in his brother.  Allergies  Allergen Reactions  . Aspirin     REACTION: full strength; causes gi irritation       PHYSICAL EXAMINATION: No physical examination with telehealth visit  ASSESSMENT:  1.  Previous problems with diarrhea and weight loss resolved.  Suspect self-limited infectious process.  Negative work-up including laboratories and CT scan.  Has actually gained weight. 2.  History of diminutive adenomatous colon polyps.  Multiple colon polyps.  Aged out of surveillance  PLAN:  1.  Discussed with them the likely nature of his problems.  Recommended observation at this point.  Advised to contact this office should he have recurrent issues or questions.  They agreed 2.  No role for colonoscopy 3.  Resume general medical care with Dr. Jacky Gillespie This telehealth visit was initiated by the patient and consented for by the patient who was in his home I was in my office  during the encounter.  He understands it may be an associated professional charge.

## 2019-06-29 DIAGNOSIS — E1151 Type 2 diabetes mellitus with diabetic peripheral angiopathy without gangrene: Secondary | ICD-10-CM | POA: Diagnosis not present

## 2019-06-29 DIAGNOSIS — Z125 Encounter for screening for malignant neoplasm of prostate: Secondary | ICD-10-CM | POA: Diagnosis not present

## 2019-06-29 DIAGNOSIS — I10 Essential (primary) hypertension: Secondary | ICD-10-CM | POA: Diagnosis not present

## 2019-06-29 DIAGNOSIS — R82998 Other abnormal findings in urine: Secondary | ICD-10-CM | POA: Diagnosis not present

## 2019-06-29 DIAGNOSIS — Z20828 Contact with and (suspected) exposure to other viral communicable diseases: Secondary | ICD-10-CM | POA: Diagnosis not present

## 2019-07-06 DIAGNOSIS — G7000 Myasthenia gravis without (acute) exacerbation: Secondary | ICD-10-CM | POA: Diagnosis not present

## 2019-07-06 DIAGNOSIS — E1140 Type 2 diabetes mellitus with diabetic neuropathy, unspecified: Secondary | ICD-10-CM | POA: Diagnosis not present

## 2019-07-06 DIAGNOSIS — E1129 Type 2 diabetes mellitus with other diabetic kidney complication: Secondary | ICD-10-CM | POA: Diagnosis not present

## 2019-07-06 DIAGNOSIS — I700 Atherosclerosis of aorta: Secondary | ICD-10-CM | POA: Diagnosis not present

## 2019-07-06 DIAGNOSIS — E1151 Type 2 diabetes mellitus with diabetic peripheral angiopathy without gangrene: Secondary | ICD-10-CM | POA: Diagnosis not present

## 2019-07-06 DIAGNOSIS — G609 Hereditary and idiopathic neuropathy, unspecified: Secondary | ICD-10-CM | POA: Diagnosis not present

## 2019-07-06 DIAGNOSIS — N400 Enlarged prostate without lower urinary tract symptoms: Secondary | ICD-10-CM | POA: Diagnosis not present

## 2019-07-06 DIAGNOSIS — Z0000 Encounter for general adult medical examination without abnormal findings: Secondary | ICD-10-CM | POA: Diagnosis not present

## 2019-07-06 DIAGNOSIS — I129 Hypertensive chronic kidney disease with stage 1 through stage 4 chronic kidney disease, or unspecified chronic kidney disease: Secondary | ICD-10-CM | POA: Diagnosis not present

## 2019-07-06 DIAGNOSIS — E785 Hyperlipidemia, unspecified: Secondary | ICD-10-CM | POA: Diagnosis not present

## 2019-07-06 DIAGNOSIS — I2699 Other pulmonary embolism without acute cor pulmonale: Secondary | ICD-10-CM | POA: Diagnosis not present

## 2019-07-06 DIAGNOSIS — N182 Chronic kidney disease, stage 2 (mild): Secondary | ICD-10-CM | POA: Diagnosis not present

## 2019-10-08 DIAGNOSIS — Z23 Encounter for immunization: Secondary | ICD-10-CM | POA: Diagnosis not present

## 2019-11-02 DIAGNOSIS — R197 Diarrhea, unspecified: Secondary | ICD-10-CM | POA: Diagnosis not present

## 2019-11-02 DIAGNOSIS — N182 Chronic kidney disease, stage 2 (mild): Secondary | ICD-10-CM | POA: Diagnosis not present

## 2019-11-02 DIAGNOSIS — I129 Hypertensive chronic kidney disease with stage 1 through stage 4 chronic kidney disease, or unspecified chronic kidney disease: Secondary | ICD-10-CM | POA: Diagnosis not present

## 2019-11-02 DIAGNOSIS — R062 Wheezing: Secondary | ICD-10-CM | POA: Diagnosis not present

## 2019-11-02 DIAGNOSIS — E1151 Type 2 diabetes mellitus with diabetic peripheral angiopathy without gangrene: Secondary | ICD-10-CM | POA: Diagnosis not present

## 2019-11-02 DIAGNOSIS — J45909 Unspecified asthma, uncomplicated: Secondary | ICD-10-CM | POA: Diagnosis not present

## 2020-01-05 ENCOUNTER — Ambulatory Visit: Payer: Medicare Other | Attending: Internal Medicine

## 2020-01-05 DIAGNOSIS — Z23 Encounter for immunization: Secondary | ICD-10-CM | POA: Insufficient documentation

## 2020-01-05 NOTE — Progress Notes (Signed)
   [id number]  Name:  [name].    MRN: [id number] DOB: 06-19-1932  01/05/2020  [name] was observed post Covid-19 immunization for 30 minutes based on pre-vaccination screening without incidence. He was provided with Vaccine Information Sheet and instruction to access the V-Safe system.   [name] was instructed to call 911 with any severe reactions post vaccine: Marland Kitchen Difficulty breathing  . Swelling of your face and throat  . A fast heartbeat  . A bad rash all over your body  . Dizziness and weakness    Immunizations Administered    Name Date Dose VIS Date Route   Pfizer COVID-19 Vaccine 01/05/2020 11:45 AM 0.3 mL 12/04/2019 Intramuscular   Manufacturer: [employer], [employer]   Lot: [id number]   NDC: [address]-1

## 2020-01-25 ENCOUNTER — Ambulatory Visit: Payer: Medicare Other | Attending: Internal Medicine

## 2020-01-25 DIAGNOSIS — Z23 Encounter for immunization: Secondary | ICD-10-CM | POA: Insufficient documentation

## 2020-01-25 NOTE — Progress Notes (Signed)
   [id number]  Name:  [name].    MRN: [id number] DOB: 11-18-1932  01/25/2020  [name] was observed post Covid-19 immunization for 15 minutes without incidence. He was provided with Vaccine Information Sheet and instruction to access the V-Safe system.   [name] was instructed to call 911 with any severe reactions post vaccine: Marland Kitchen Difficulty breathing  . Swelling of your face and throat  . A fast heartbeat  . A bad rash all over your body  . Dizziness and weakness    Immunizations Administered    Name Date Dose VIS Date Route   Pfizer COVID-19 Vaccine 01/25/2020  9:51 AM 0.3 mL 12/04/2019 Intramuscular   Manufacturer: [employer], [employer]   Lot: [id number]   NDC: [address]-1

## 2020-02-08 DIAGNOSIS — E1129 Type 2 diabetes mellitus with other diabetic kidney complication: Secondary | ICD-10-CM | POA: Diagnosis not present

## 2020-02-08 DIAGNOSIS — I10 Essential (primary) hypertension: Secondary | ICD-10-CM | POA: Diagnosis not present

## 2020-02-08 DIAGNOSIS — I700 Atherosclerosis of aorta: Secondary | ICD-10-CM | POA: Diagnosis not present

## 2020-02-08 DIAGNOSIS — E1140 Type 2 diabetes mellitus with diabetic neuropathy, unspecified: Secondary | ICD-10-CM | POA: Diagnosis not present

## 2020-02-08 DIAGNOSIS — Z1331 Encounter for screening for depression: Secondary | ICD-10-CM | POA: Diagnosis not present

## 2020-02-08 DIAGNOSIS — I2699 Other pulmonary embolism without acute cor pulmonale: Secondary | ICD-10-CM | POA: Diagnosis not present

## 2020-02-08 DIAGNOSIS — E785 Hyperlipidemia, unspecified: Secondary | ICD-10-CM | POA: Diagnosis not present

## 2020-02-08 DIAGNOSIS — E1151 Type 2 diabetes mellitus with diabetic peripheral angiopathy without gangrene: Secondary | ICD-10-CM | POA: Diagnosis not present

## 2020-03-04 DIAGNOSIS — J45909 Unspecified asthma, uncomplicated: Secondary | ICD-10-CM | POA: Diagnosis not present

## 2020-03-04 DIAGNOSIS — I10 Essential (primary) hypertension: Secondary | ICD-10-CM | POA: Diagnosis not present

## 2020-05-22 ENCOUNTER — Emergency Department (HOSPITAL_COMMUNITY)
Admission: EM | Admit: 2020-05-22 | Discharge: 2020-05-22 | Disposition: A | Discharge status: Home / Self Care | Payer: Medicare Other | Attending: Emergency Medicine | Admitting: Emergency Medicine

## 2020-05-22 ENCOUNTER — Encounter (HOSPITAL_COMMUNITY): Payer: Self-pay | Admitting: Emergency Medicine

## 2020-05-22 ENCOUNTER — Other Ambulatory Visit: Payer: Self-pay

## 2020-05-22 ENCOUNTER — Emergency Department (HOSPITAL_COMMUNITY): Payer: Medicare Other

## 2020-05-22 DIAGNOSIS — Z87891 Personal history of nicotine dependence: Secondary | ICD-10-CM | POA: Diagnosis not present

## 2020-05-22 DIAGNOSIS — R42 Dizziness and giddiness: Secondary | ICD-10-CM | POA: Diagnosis not present

## 2020-05-22 DIAGNOSIS — Z79899 Other long term (current) drug therapy: Secondary | ICD-10-CM | POA: Insufficient documentation

## 2020-05-22 DIAGNOSIS — I2510 Atherosclerotic heart disease of native coronary artery without angina pectoris: Secondary | ICD-10-CM | POA: Diagnosis not present

## 2020-05-22 DIAGNOSIS — I10 Essential (primary) hypertension: Secondary | ICD-10-CM | POA: Diagnosis not present

## 2020-05-22 DIAGNOSIS — Z7901 Long term (current) use of anticoagulants: Secondary | ICD-10-CM | POA: Diagnosis not present

## 2020-05-22 LAB — DIFFERENTIAL
Abs Immature Granulocytes: 0.01 10*3/uL (ref 0.00–0.07)
Basophils Absolute: 0 10*3/uL (ref 0.0–0.1)
Basophils Relative: 1 %
Eosinophils Absolute: 0.1 10*3/uL (ref 0.0–0.5)
Eosinophils Relative: 1 %
Immature Granulocytes: 0 %
Lymphocytes Relative: 12 %
Lymphs Abs: 0.6 10*3/uL — ABNORMAL LOW (ref 0.7–4.0)
Monocytes Absolute: 0.2 10*3/uL (ref 0.1–1.0)
Monocytes Relative: 3 %
Neutro Abs: 4 10*3/uL (ref 1.7–7.7)
Neutrophils Relative %: 83 %

## 2020-05-22 LAB — COMPREHENSIVE METABOLIC PANEL
ALT: 13 U/L (ref 0–44)
AST: 15 U/L (ref 15–41)
Albumin: 3.5 g/dL (ref 3.5–5.0)
Alkaline Phosphatase: 86 U/L (ref 38–126)
Anion gap: 8 (ref 5–15)
BUN: 13 mg/dL (ref 8–23)
CO2: 28 mmol/L (ref 22–32)
Calcium: 9.4 mg/dL (ref 8.9–10.3)
Chloride: 105 mmol/L (ref 98–111)
Creatinine, Ser: 1.22 mg/dL (ref 0.61–1.24)
GFR calc Af Amer: >60 mL/min (ref >60)
GFR calc non Af Amer: 53 mL/min — ABNORMAL LOW (ref >60)
Glucose, Bld: 125 mg/dL — ABNORMAL HIGH (ref 70–99)
Potassium: 3.8 mmol/L (ref 3.5–5.1)
Sodium: 141 mmol/L (ref 135–145)
Total Bilirubin: 0.7 mg/dL (ref 0.3–1.2)
Total Protein: 6.9 g/dL (ref 6.5–8.1)

## 2020-05-22 LAB — CBC
HCT: 38.5 % — ABNORMAL LOW (ref 39.0–52.0)
Hemoglobin: 12.4 g/dL — ABNORMAL LOW (ref 13.0–17.0)
MCH: 30.2 pg (ref 26.0–34.0)
MCHC: 32.2 g/dL (ref 30.0–36.0)
MCV: 93.7 fL (ref 80.0–100.0)
Platelets: 233 10*3/uL (ref 150–400)
RBC: 4.11 MIL/uL — ABNORMAL LOW (ref 4.22–5.81)
RDW: 14.6 % (ref 11.5–15.5)
WBC: 4.9 10*3/uL (ref 4.0–10.5)
nRBC: 0 % (ref 0.0–0.2)

## 2020-05-22 LAB — PROTIME-INR
INR: 1 (ref 0.8–1.2)
Prothrombin Time: 13.2 seconds (ref 11.4–15.2)

## 2020-05-22 LAB — APTT: aPTT: 30 seconds (ref 24–36)

## 2020-05-22 LAB — TROPONIN I (HIGH SENSITIVITY): Troponin I (High Sensitivity): 9 ng/L (ref <18)

## 2020-05-22 MED ORDER — LORAZEPAM 2 MG/ML IJ SOLN
0.5000 mg | Freq: Once | INTRAMUSCULAR | Status: AC
  Administered 2020-05-22: 0.5 mg via INTRAVENOUS
  Filled 2020-05-22: qty 1

## 2020-05-22 MED ORDER — MECLIZINE HCL 50 MG PO TABS
50.0000 mg | ORAL_TABLET | Freq: Three times a day (TID) | ORAL | 0 refills | Status: DC | PRN
Start: 2020-05-22 — End: 2021-11-27

## 2020-05-22 MED ORDER — DIPHENHYDRAMINE HCL 50 MG/ML IJ SOLN
12.5000 mg | Freq: Once | INTRAMUSCULAR | Status: AC
  Administered 2020-05-22: 12.5 mg via INTRAVENOUS
  Filled 2020-05-22: qty 1

## 2020-05-22 MED ORDER — SODIUM CHLORIDE 0.9 % IV BOLUS
1000.0000 mL | Freq: Once | INTRAVENOUS | Status: AC
  Administered 2020-05-22: 1000 mL via INTRAVENOUS

## 2020-05-22 MED ORDER — MECLIZINE HCL 25 MG PO TABS
12.5000 mg | ORAL_TABLET | Freq: Once | ORAL | Status: AC
  Administered 2020-05-22: 12.5 mg via ORAL
  Filled 2020-05-22: qty 1

## 2020-05-22 MED ORDER — ONDANSETRON 4 MG PO TBDP
4.0000 mg | ORAL_TABLET | Freq: Three times a day (TID) | ORAL | 0 refills | Status: DC | PRN
Start: 2020-05-22 — End: 2021-07-19

## 2020-05-22 MED ORDER — ONDANSETRON HCL 4 MG/2ML IJ SOLN
4.0000 mg | Freq: Once | INTRAMUSCULAR | Status: AC
  Administered 2020-05-22: 4 mg via INTRAVENOUS
  Filled 2020-05-22: qty 2

## 2020-05-22 MED ORDER — DIAZEPAM 5 MG/ML IJ SOLN: 2.5000 mg | Freq: Once | INTRAMUSCULAR | Status: DC

## 2020-05-22 MED ORDER — PROCHLORPERAZINE EDISYLATE 10 MG/2ML IJ SOLN
5.0000 mg | Freq: Once | INTRAMUSCULAR | Status: AC
  Administered 2020-05-22: 5 mg via INTRAVENOUS
  Filled 2020-05-22: qty 2

## 2020-05-22 MED ORDER — ONDANSETRON 4 MG PO TBDP
4.0000 mg | ORAL_TABLET | Freq: Once | ORAL | Status: AC
  Administered 2020-05-22: 4 mg via ORAL
  Filled 2020-05-22 (×2): qty 1

## 2020-05-22 MED ORDER — ALUM & MAG HYDROXIDE-SIMETH 200-200-20 MG/5ML PO SUSP: 30.0000 mL | Freq: Once | ORAL | Status: DC

## 2020-05-22 MED ORDER — SODIUM CHLORIDE 0.9% FLUSH
3.0000 mL | Freq: Once | INTRAVENOUS | Status: DC
Start: 2020-05-22 — End: 2020-05-23

## 2020-05-22 MED ORDER — ONDANSETRON 4 MG PO TBDP: 8.0000 mg | ORAL_TABLET | Freq: Once | ORAL | Status: DC

## 2020-05-22 MED ORDER — FAMOTIDINE 20 MG PO TABS: 20.0000 mg | ORAL_TABLET | Freq: Once | ORAL | Status: DC

## 2020-05-22 NOTE — ED Provider Notes (Addendum)
[facility] EMERGENCY DEPARTMENT Provider Note   CSN: [id number] Arrival date & time: 05/22/20  1155     History Chief Complaint  Patient presents with  . Dizziness    [name]. is a 86 y.o. male.  Patient c/o onset feeling dizzy last pm around 12 MN. States has room spinning sensation, worse w change of position/head movement. Symptoms acute onset, mild-mod, recurrent. States similar symptoms intermittently in past - pt unsure of specific diagnosis then. Denies ear pain, hearing loss or tinnitus. No sinus congestion or uri/allergy symptoms. No fever or chills. Denies headache. Has continued to be ambulatory, denies falling or unsteadiness of gait. No change in speech or vision. No numbness/weakness, or loss of normal function. +mild nausea, stomach upset. No vomiting. States hasnt eaten yet today, and feels like he needs to eat/drink something. No lightheadedness or syncope. No blood loss, rectal bleeding or melena. No recent change in meds.   The history is provided by the patient.  Dizziness Associated symptoms: nausea   Associated symptoms: no chest pain, no headaches, no shortness of breath and no weakness        Past Medical History:  Diagnosis Date  . Aortic atherosclerosis ([place])   . Chest pain   . Coronary atherosclerosis   . GERD (gastroesophageal reflux disease)   . Hypertension   . Iliac artery aneurysm ([place])   . Kidney stone   . Myasthenia gravis ([place]) 03/29/2017  . Pulmonary embolism [facility])     Patient Active Problem List   Diagnosis Date Noted  . Hyperglycemia 05/20/2018  . Hypertension   . Chest pain   . Pulmonary embolism ([place])   . GERD (gastroesophageal reflux disease)   . Coronary atherosclerosis   . Aortic atherosclerosis ([place])   . Iliac artery aneurysm ([place])   . Myasthenia gravis ([place]) 03/29/2017    Past Surgical History:  Procedure Laterality Date  . KNEE SURGERY    . PROSTATE SURGERY         Family History    Problem Relation Age of Onset  . Heart attack Father   . Sarcoidosis Brother     Social History   Tobacco Use  . Smoking status: Former Research scientist (life sciences)  . Smokeless tobacco: Former Systems developer    Types: Chew  . Tobacco comment: Quit about 30 years ago  Substance Use Topics  . Alcohol use: No  . Drug use: No    Home Medications Prior to Admission medications   Medication Sig Start Date End Date Taking? Authorizing Provider  lisinopril (PRINIVIL,ZESTRIL) 10 MG tablet Take 10 mg by mouth daily.    [provider]  pravastatin (PRAVACHOL) 20 MG tablet Take 20 mg by mouth daily.    [provider]  predniSONE (DELTASONE) 10 MG tablet Take 1 tablet (10 mg total) by mouth daily with breakfast. Patient not taking: Reported on 12/31/2018 05/01/18   Kathrynn Ducking, MD  Rivaroxaban 15 & 20 MG TBPK Take as directed on package: Start with one 15mg  tablet by mouth twice a day with food. On Day 22, switch to one 20mg  tablet once a day with food. Patient taking differently: Take 20 mg by mouth daily.  05/20/18   Langston Masker B, PA-C    Allergies    Aspirin  Review of Systems   Review of Systems  Constitutional: Negative for fever.  HENT: Negative for sore throat.   Eyes: Negative for redness and visual disturbance.  Respiratory: Negative for shortness of breath.  Cardiovascular: Negative for chest pain.  Gastrointestinal: Positive for nausea. Negative for abdominal pain.  Genitourinary: Negative for dysuria and flank pain.  Musculoskeletal: Negative for back pain and neck pain.  Skin: Negative for rash.  Neurological: Positive for dizziness. Negative for speech difficulty, weakness, numbness and headaches.  Hematological: Does not bruise/bleed easily.  Psychiatric/Behavioral: Negative for confusion.    Physical Exam Updated Vital Signs BP (!) 196/82 (BP Location: Left Arm)   Pulse (!) 50   Temp 97.7 F (36.5 C) (Oral)   Resp 14   SpO2 99%   Physical Exam Vitals and  nursing note reviewed.  Constitutional:      Appearance: Normal appearance. He is well-developed.  HENT:     Head: Atraumatic.     Right Ear: Tympanic membrane normal.     Left Ear: Tympanic membrane normal.     Nose: Nose normal.     Mouth/Throat:     Mouth: Mucous membranes are moist.     Pharynx: Oropharynx is clear.  Eyes:     General: No scleral icterus.    Extraocular Movements: Extraocular movements intact.     Conjunctiva/sclera: Conjunctivae normal.     Pupils: Pupils are equal, round, and reactive to light.  Neck:     Vascular: No carotid bruit.     Trachea: No tracheal deviation.  Cardiovascular:     Rate and Rhythm: Normal rate and regular rhythm.     Pulses: Normal pulses.     Heart sounds: Normal heart sounds. No murmur. No friction rub. No gallop.   Pulmonary:     Effort: Pulmonary effort is normal. No accessory muscle usage or respiratory distress.     Breath sounds: Normal breath sounds.  Abdominal:     General: Bowel sounds are normal. There is no distension.     Palpations: Abdomen is soft.     Tenderness: There is no abdominal tenderness. There is no guarding.  Genitourinary:    Comments: No cva tenderness. Musculoskeletal:        General: No swelling.     Cervical back: Normal range of motion and neck supple. No rigidity.  Skin:    General: Skin is warm and dry.     Findings: No rash.  Neurological:     Mental Status: He is alert.     Comments: Alert, speech clear. No aphasia or dysarthria. Motor intact bil, stre 5/5. No pronator drift. Sensation intact bil. Sl unteady gait, no gross ataxia. Transient unidirectional nystagmus.   Psychiatric:        Mood and Affect: Mood normal.     ED Results / Procedures / Treatments   Labs (all labs ordered are listed, but only abnormal results are displayed) Results for orders placed or performed during the hospital encounter of 05/22/20  Protime-INR  Result Value Ref Range   Prothrombin Time 13.2 11.4 -  15.2 seconds   INR 1.0 0.8 - 1.2  APTT  Result Value Ref Range   aPTT 30 24 - 36 seconds  CBC  Result Value Ref Range   WBC 4.9 4.0 - 10.5 K/uL   RBC 4.11 (L) 4.22 - 5.81 MIL/uL   Hemoglobin 12.4 (L) 13.0 - 17.0 g/dL   HCT 16.6 (L) 06.3 - 01.6 %   MCV 93.7 80.0 - 100.0 fL   MCH 30.2 26.0 - 34.0 pg   MCHC 32.2 30.0 - 36.0 g/dL   RDW 01.0 93.2 - 35.5 %   Platelets 233 150 - 400 K/uL  nRBC 0.0 0.0 - 0.2 %  Differential  Result Value Ref Range   Neutrophils Relative % 83 %   Neutro Abs 4.0 1.7 - 7.7 K/uL   Lymphocytes Relative 12 %   Lymphs Abs 0.6 (L) 0.7 - 4.0 K/uL   Monocytes Relative 3 %   Monocytes Absolute 0.2 0.1 - 1.0 K/uL   Eosinophils Relative 1 %   Eosinophils Absolute 0.1 0.0 - 0.5 K/uL   Basophils Relative 1 %   Basophils Absolute 0.0 0.0 - 0.1 K/uL   Immature Granulocytes 0 %   Abs Immature Granulocytes 0.01 0.00 - 0.07 K/uL  Comprehensive metabolic panel  Result Value Ref Range   Sodium 141 135 - 145 mmol/L   Potassium 3.8 3.5 - 5.1 mmol/L   Chloride 105 98 - 111 mmol/L   CO2 28 22 - 32 mmol/L   Glucose, Bld 125 (H) 70 - 99 mg/dL   BUN 13 8 - 23 mg/dL   Creatinine, Ser 4.65 0.61 - 1.24 mg/dL   Calcium 9.4 8.9 - 03.5 mg/dL   Total Protein 6.9 6.5 - 8.1 g/dL   Albumin 3.5 3.5 - 5.0 g/dL   AST 15 15 - 41 U/L   ALT 13 0 - 44 U/L   Alkaline Phosphatase 86 38 - 126 U/L   Total Bilirubin 0.7 0.3 - 1.2 mg/dL   GFR calc non Af Amer 53 (L) >60 mL/min   GFR calc Af Amer >60 >60 mL/min   Anion gap 8 5 - 15   CT HEAD WO CONTRAST  Result Date: 05/22/2020 CLINICAL DATA:  86 year old male with acute dizziness for 1 day. EXAM: CT HEAD WITHOUT CONTRAST TECHNIQUE: Contiguous axial images were obtained from the base of the skull through the vertex without intravenous contrast. COMPARISON:  09/03/2007 MR FINDINGS: Brain: No evidence of acute infarction, hemorrhage, hydrocephalus, extra-axial collection or mass lesion/mass effect. Mild chronic small-vessel white matter  ischemic changes are noted. Vascular: Carotid and vertebral atherosclerotic calcifications are noted. Skull: Normal. Negative for fracture or focal lesion. Sinuses/Orbits: No acute finding. Mild mucosal thickening within the paranasal sinuses noted. Other: None. IMPRESSION: 1. No evidence of acute intracranial abnormality. 2. Mild chronic small-vessel white matter ischemic changes. Electronically Signed   By: Harmon Pier M.D.   On: 05/22/2020 13:16    EKG EKG Interpretation  Date/Time:  Sunday May 22 2020 12:06:34 EDT Ventricular Rate:  58 PR Interval:  136 QRS Duration: 80 QT Interval:  428 QTC Calculation: 420 R Axis:   46 Text Interpretation: Sinus bradycardia with sinus arrhythmia with occasional Premature ventricular complexes Confirmed by Cathren Laine ([id number]) on 05/22/2020 12:48:23 PM   Radiology CT HEAD WO CONTRAST  Result Date: 05/22/2020 CLINICAL DATA:  86 year old male with acute dizziness for 1 day. EXAM: CT HEAD WITHOUT CONTRAST TECHNIQUE: Contiguous axial images were obtained from the base of the skull through the vertex without intravenous contrast. COMPARISON:  09/03/2007 MR FINDINGS: Brain: No evidence of acute infarction, hemorrhage, hydrocephalus, extra-axial collection or mass lesion/mass effect. Mild chronic small-vessel white matter ischemic changes are noted. Vascular: Carotid and vertebral atherosclerotic calcifications are noted. Skull: Normal. Negative for fracture or focal lesion. Sinuses/Orbits: No acute finding. Mild mucosal thickening within the paranasal sinuses noted. Other: None. IMPRESSION: 1. No evidence of acute intracranial abnormality. 2. Mild chronic small-vessel white matter ischemic changes. Electronically Signed   By: Harmon Pier M.D.   On: 05/22/2020 13:16    Procedures Procedures (including critical care time)  Medications Ordered in ED  Medications  sodium chloride flush (NS) 0.9 % injection 3 mL (has no administration in time range)  alum & mag  hydroxide-simeth (MAALOX/MYLANTA) 200-200-20 MG/5ML suspension 30 mL (has no administration in time range)  famotidine (PEPCID) tablet 20 mg (has no administration in time range)  ondansetron (ZOFRAN-ODT) disintegrating tablet 8 mg (has no administration in time range)  meclizine (ANTIVERT) tablet 12.5 mg (has no administration in time range)    ED Course  I have reviewed the triage vital signs and the nursing notes.  Pertinent labs & imaging results that were available during my care of the patient were reviewed by me and considered in my medical decision making (see chart for details).    MDM Rules/Calculators/A&P                      Iv ns. Stat labs and imaging ordered from triage.   Reviewed nursing notes and prior charts for additional history.   Trial of po fluids - patient with episode of nausea/vomiting - emesis clear, not bloody or bilious.   Iv ns bolus. zofran iv. Ativan .5 mg iv. As nausea improved, antivert po.   Recheck pt - feels improved, await labs.   On recheck, mildly unsteady gait, holding onto wall.   1540 signed out to Dr Adela Lank, check mri, recheck pt and dispo appropriately.   Final Clinical Impression(s) / ED Diagnoses Final diagnoses:  None    Rx / DC Orders ED Discharge Orders    None             Cathren Laine, MD 05/22/20 [phone]

## 2020-05-22 NOTE — ED Notes (Signed)
Patient verbalizes understanding of discharge instructions. Opportunity for questioning and answers were provided. Armband removed by staff, pt discharged from ED amulatory.

## 2020-05-22 NOTE — ED Triage Notes (Signed)
Reports dizziness x 24 hours.  States he felt like he was gong to pass out this morning.  States he felt like he may have ate too much yesterday and had nausea last night.  No arm drift.  Reports cramping to back of neck for the past several weeks.

## 2020-05-22 NOTE — ED Provider Notes (Signed)
I received the patient in signout from Dr. Denton Lank.  Briefly the patient is a 86 year old male with a chief complaint of dizziness.  Thought to be most likely peripheral but planning for an MRI of the brain.  If negative he felt he could likely go home.  MRI is negative.  I reassessed the patient and he had an episode of emesis while he was an MRI.  He told me that when he sat up suddenly after the exam was over he got very dizzy and ended up vomiting.  Feels little bit nauseated currently.  Given a dose of Compazine and Benadryl with improvement.  Discussed with the family.  They will go home.  Will follow with the family doctor.   Melene Plan, DO 05/22/20 [phone]

## 2020-05-22 NOTE — Discharge Instructions (Addendum)
It was our pleasure to provide your ER care today - we hope that you feel better.  Rest. Drink plenty of fluids.   Take antivert as need for dizziness. Take zofran as need if nauseated.   Follow up with primary care doctor in the next 2-3 days if symptoms fail to improve/resolve.  Return to ER if worse, new symptoms, fevers, weak/fainting, persistent vomiting, change in speech or vision, numbness/weakness, or other concern.   No driving if/when feeling dizzy.

## 2020-05-26 DIAGNOSIS — E1129 Type 2 diabetes mellitus with other diabetic kidney complication: Secondary | ICD-10-CM | POA: Diagnosis not present

## 2020-05-26 DIAGNOSIS — I129 Hypertensive chronic kidney disease with stage 1 through stage 4 chronic kidney disease, or unspecified chronic kidney disease: Secondary | ICD-10-CM | POA: Diagnosis not present

## 2020-05-26 DIAGNOSIS — N182 Chronic kidney disease, stage 2 (mild): Secondary | ICD-10-CM | POA: Diagnosis not present

## 2020-05-26 DIAGNOSIS — R42 Dizziness and giddiness: Secondary | ICD-10-CM | POA: Diagnosis not present

## 2020-05-26 DIAGNOSIS — E1140 Type 2 diabetes mellitus with diabetic neuropathy, unspecified: Secondary | ICD-10-CM | POA: Diagnosis not present

## 2020-05-26 DIAGNOSIS — E1151 Type 2 diabetes mellitus with diabetic peripheral angiopathy without gangrene: Secondary | ICD-10-CM | POA: Diagnosis not present

## 2020-06-02 ENCOUNTER — Ambulatory Visit (INDEPENDENT_AMBULATORY_CARE_PROVIDER_SITE_OTHER): Payer: Medicare Other | Admitting: Otolaryngology

## 2020-06-02 ENCOUNTER — Other Ambulatory Visit: Payer: Self-pay

## 2020-06-02 VITALS — Temp 98.2°F

## 2020-06-02 DIAGNOSIS — H6123 Impacted cerumen, bilateral: Secondary | ICD-10-CM

## 2020-06-02 DIAGNOSIS — H8120 Vestibular neuronitis, unspecified ear: Secondary | ICD-10-CM

## 2020-06-02 DIAGNOSIS — J310 Chronic rhinitis: Secondary | ICD-10-CM | POA: Diagnosis not present

## 2020-06-02 NOTE — Progress Notes (Signed)
HPI: [name]. is a 86 y.o. male who presents is referred by PCP for evaluation of recent bout of vertigo.  Apparently this occurred early in the morning on June 30.  He stated that he woke up at approximately 4 AM and things were moving including the clock.Marland Kitchen  He went back to bed and woke back up at 8 according to his wife and was still having some vertigo dizziness associated with nausea and vomiting.  He subsequently was taken to the ED where he had a CT scan and MRI scan which were normal.  He was treated with meclizine and Zofran and sent home. He is not having any more vertigo but still has some slight imbalance and dizziness.  No further nausea or vomiting. He has not noted any change in his hearing. He does complain of some clear drainage from his nose.Marland Kitchen  Past Medical History:  Diagnosis Date  . Aortic atherosclerosis ([place])   . Chest pain   . Coronary atherosclerosis   . GERD (gastroesophageal reflux disease)   . Hypertension   . Iliac artery aneurysm ([place])   . Kidney stone   . Myasthenia gravis ([place]) 03/29/2017  . Pulmonary embolism [facility])    Past Surgical History:  Procedure Laterality Date  . KNEE SURGERY    . PROSTATE SURGERY     Social History   Socioeconomic History  . Marital status: Married    Spouse name: Not on file  . Number of children: 3  . Years of education: 6  . Highest education level: Not on file  Occupational History  . Occupation: Self employed  Tobacco Use  . Smoking status: Former Research scientist (life sciences)  . Smokeless tobacco: Former Systems developer    Types: Chew  . Tobacco comment: Quit about 30 years ago  Vaping Use  . Vaping Use: Never used  Substance and Sexual Activity  . Alcohol use: No  . Drug use: No  . Sexual activity: Not on file  Other Topics Concern  . Not on file  Social History Narrative   Lives w/ wife   Caffeine use: Coffee ocass   Social Determinants of Health   Financial Resource Strain:   . Difficulty of Paying Living Expenses:   Food  Insecurity:   . Worried About Charity fundraiser in the Last Year:   . Arboriculturist in the Last Year:   Transportation Needs:   . Film/video editor (Medical):   Marland Kitchen Lack of Transportation (Non-Medical):   Physical Activity:   . Days of Exercise per Week:   . Minutes of Exercise per Session:   Stress:   . Feeling of Stress :   Social Connections:   . Frequency of Communication with Friends and Family:   . Frequency of Social Gatherings with Friends and Family:   . Attends Religious Services:   . Active Member of Clubs or Organizations:   . Attends Archivist Meetings:   Marland Kitchen Marital Status:    Family History  Problem Relation Age of Onset  . Heart attack Father   . Sarcoidosis Brother    Allergies  Allergen Reactions  . Aspirin     REACTION: full strength; causes gi irritation   Prior to Admission medications   Medication Sig Start Date End Date Taking? Authorizing Provider  albuterol (VENTOLIN HFA) 108 (90 Base) MCG/ACT inhaler Inhale 2 puffs into the lungs every 6 (six) hours as needed for wheezing. 03/04/20   [provider]  lisinopril (  PRINIVIL,ZESTRIL) 10 MG tablet Take 10 mg by mouth daily.    [provider]  meclizine (ANTIVERT) 50 MG tablet Take 1 tablet (50 mg total) by mouth 3 (three) times daily as needed. 05/22/20   Melene Plan, DO  ondansetron (ZOFRAN ODT) 4 MG disintegrating tablet Take 1 tablet (4 mg total) by mouth every 8 (eight) hours as needed for nausea or vomiting. 05/22/20   Melene Plan, DO  pravastatin (PRAVACHOL) 20 MG tablet Take 20 mg by mouth daily.    [provider]  predniSONE (DELTASONE) 10 MG tablet Take 1 tablet (10 mg total) by mouth daily with breakfast. Patient not taking: Reported on 12/31/2018 05/01/18   York Spaniel, MD  Rivaroxaban 15 & 20 MG TBPK Take as directed on package: Start with one 15mg  tablet by mouth twice a day with food. On Day 22, switch to one 20mg  tablet once a day with food. Patient  taking differently: Take 20 mg by mouth daily.  05/20/18   B, PA-C     Positive ROS: Otherwise negative  All other systems have been reviewed and were otherwise negative with the exception of those mentioned in the HPI and as above.  Physical Exam: Constitutional: Alert, well-appearing, no acute distress Ears: External ears without lesions or tenderness.  He has moderate wax buildup in both ears that was cleaned in the office using curettes.  TMs were clear bilaterally with good mobility on pneumatic otoscopy.  On hearing screening with a 512 1024 tuning fork he had good hearing in both ears which was symmetric.  On Dix-Hallpike testing he had no present evidence of BPPV with no nystagmus or vertigo. Nasal: External nose without lesions. Septum midline with mild rhinitis clear mucus discharge.. Clear nasal passages otherwise with no signs of infection. Oral: Lips and gums without lesions. Tongue and palate mucosa without lesions. Posterior oropharynx clear. Neck: No palpable adenopathy or masses Respiratory: Breathing comfortably  Skin: No facial/neck lesions or rash noted.  Cerumen impaction removal  Date/Time: 06/02/2020 1:41 PM Performed by: Aviva Kluver, MD Authorized by: 08/02/2020, MD   Consent:    Consent obtained:  Verbal   Consent given by:  Patient   Risks discussed:  Pain and bleeding Procedure details:    Location:  L ear and R ear   Procedure type: curette   Post-procedure details:    Inspection:  TM intact and canal normal   Hearing quality:  Improved   Patient tolerance of procedure:  Tolerated well, no immediate complications Comments:     TMs are otherwise clear.    Assessment: Recent bout of vertigo probably related to inner ear abnormality or vestibular neuronitis.  Presently no evidence of BPPV and no evidence of any hearing loss. Chronic rhinitis  Plan: Would expect this to gradually improve. He has meclizine to take  25 mg every 8 hours as needed dizziness or any further episodes of vertigo.  Discussed with patient as well as his wife concerning follow-up here if he has further episodes of vertigo for recheck as needed. For his nasal drainage suggested using Claritin or Allegra or Flonase.  The wife feels like they have some Flonase at home.    Drema Halon, MD   CC:

## 2020-09-14 DIAGNOSIS — E1140 Type 2 diabetes mellitus with diabetic neuropathy, unspecified: Secondary | ICD-10-CM | POA: Diagnosis not present

## 2020-09-14 DIAGNOSIS — Z23 Encounter for immunization: Secondary | ICD-10-CM | POA: Diagnosis not present

## 2020-09-14 DIAGNOSIS — I2699 Other pulmonary embolism without acute cor pulmonale: Secondary | ICD-10-CM | POA: Diagnosis not present

## 2020-09-14 DIAGNOSIS — I130 Hypertensive heart and chronic kidney disease with heart failure and stage 1 through stage 4 chronic kidney disease, or unspecified chronic kidney disease: Secondary | ICD-10-CM | POA: Diagnosis not present

## 2020-09-27 ENCOUNTER — Ambulatory Visit: Payer: Medicare Other | Attending: Internal Medicine

## 2020-09-27 DIAGNOSIS — Z23 Encounter for immunization: Secondary | ICD-10-CM

## 2020-09-27 NOTE — Progress Notes (Signed)
° °  [id number]  Name:  [name].    MRN: [id number] DOB: Dec 19, 1932  09/27/2020  [name] was observed post Covid-19 immunization for 15 minutes without incident. He was provided with Vaccine Information Sheet and instruction to access the V-Safe system.   [name] was instructed to call 911 with any severe reactions post vaccine:  Difficulty breathing   Swelling of face and throat   A fast heartbeat   A bad rash all over body   Dizziness and weakness

## 2021-01-06 DIAGNOSIS — J209 Acute bronchitis, unspecified: Secondary | ICD-10-CM | POA: Diagnosis not present

## 2021-01-06 DIAGNOSIS — Z1152 Encounter for screening for COVID-19: Secondary | ICD-10-CM | POA: Diagnosis not present

## 2021-01-06 DIAGNOSIS — Z20828 Contact with and (suspected) exposure to other viral communicable diseases: Secondary | ICD-10-CM | POA: Diagnosis not present

## 2021-01-06 DIAGNOSIS — I129 Hypertensive chronic kidney disease with stage 1 through stage 4 chronic kidney disease, or unspecified chronic kidney disease: Secondary | ICD-10-CM | POA: Diagnosis not present

## 2021-01-06 DIAGNOSIS — N182 Chronic kidney disease, stage 2 (mild): Secondary | ICD-10-CM | POA: Diagnosis not present

## 2021-01-06 DIAGNOSIS — R062 Wheezing: Secondary | ICD-10-CM | POA: Diagnosis not present

## 2021-01-06 DIAGNOSIS — I2699 Other pulmonary embolism without acute cor pulmonale: Secondary | ICD-10-CM | POA: Diagnosis not present

## 2021-01-25 DIAGNOSIS — I2699 Other pulmonary embolism without acute cor pulmonale: Secondary | ICD-10-CM | POA: Diagnosis not present

## 2021-01-25 DIAGNOSIS — I129 Hypertensive chronic kidney disease with stage 1 through stage 4 chronic kidney disease, or unspecified chronic kidney disease: Secondary | ICD-10-CM | POA: Diagnosis not present

## 2021-01-25 DIAGNOSIS — E1129 Type 2 diabetes mellitus with other diabetic kidney complication: Secondary | ICD-10-CM | POA: Diagnosis not present

## 2021-01-25 DIAGNOSIS — E1140 Type 2 diabetes mellitus with diabetic neuropathy, unspecified: Secondary | ICD-10-CM | POA: Diagnosis not present

## 2021-01-25 DIAGNOSIS — K4090 Unilateral inguinal hernia, without obstruction or gangrene, not specified as recurrent: Secondary | ICD-10-CM | POA: Diagnosis not present

## 2021-01-25 DIAGNOSIS — R062 Wheezing: Secondary | ICD-10-CM | POA: Diagnosis not present

## 2021-01-25 DIAGNOSIS — N182 Chronic kidney disease, stage 2 (mild): Secondary | ICD-10-CM | POA: Diagnosis not present

## 2021-05-29 DIAGNOSIS — N182 Chronic kidney disease, stage 2 (mild): Secondary | ICD-10-CM | POA: Diagnosis not present

## 2021-05-29 DIAGNOSIS — E1129 Type 2 diabetes mellitus with other diabetic kidney complication: Secondary | ICD-10-CM | POA: Diagnosis not present

## 2021-05-29 DIAGNOSIS — I130 Hypertensive heart and chronic kidney disease with heart failure and stage 1 through stage 4 chronic kidney disease, or unspecified chronic kidney disease: Secondary | ICD-10-CM | POA: Diagnosis not present

## 2021-05-29 DIAGNOSIS — I10 Essential (primary) hypertension: Secondary | ICD-10-CM | POA: Diagnosis not present

## 2021-05-29 DIAGNOSIS — R6889 Other general symptoms and signs: Secondary | ICD-10-CM | POA: Diagnosis not present

## 2021-05-30 LAB — CBC: RBC: 3.8 — AB (ref 3.87–5.11)

## 2021-05-30 LAB — COMPREHENSIVE METABOLIC PANEL
Albumin: 3.7 (ref 3.5–5.0)
Calcium: 9.2 (ref 8.7–10.7)
GFR calc Af Amer: 63.2
GFR calc non Af Amer: 52.2

## 2021-05-30 LAB — BASIC METABOLIC PANEL
BUN: 15 (ref 4–21)
CO2: 25 — AB (ref 13–22)
Chloride: 107 (ref 99–108)
Creatinine: 1.3 (ref 0.6–1.3)
Glucose: 93
Potassium: 5 (ref 3.4–5.3)
Sodium: 143 (ref 137–147)

## 2021-05-30 LAB — HEPATIC FUNCTION PANEL
ALT: 12 (ref 10–40)
AST: 17 (ref 14–40)
Alkaline Phosphatase: 73 (ref 25–125)
Bilirubin, Total: 0.8

## 2021-05-30 LAB — TSH: TSH: 0.41 (ref 0.41–5.90)

## 2021-05-30 LAB — CBC AND DIFFERENTIAL
HCT: 37 — AB (ref 41–53)
Hemoglobin: 12.1 — AB (ref 13.5–17.5)
Platelets: 199 (ref 150–399)
WBC: 4.3

## 2021-05-31 ENCOUNTER — Other Ambulatory Visit: Payer: Self-pay | Admitting: Internal Medicine

## 2021-05-31 DIAGNOSIS — R634 Abnormal weight loss: Secondary | ICD-10-CM

## 2021-06-08 ENCOUNTER — Other Ambulatory Visit: Payer: Self-pay | Admitting: Internal Medicine

## 2021-06-08 DIAGNOSIS — R634 Abnormal weight loss: Secondary | ICD-10-CM

## 2021-06-14 ENCOUNTER — Encounter (INDEPENDENT_AMBULATORY_CARE_PROVIDER_SITE_OTHER): Payer: Self-pay

## 2021-06-14 ENCOUNTER — Ambulatory Visit
Admission: RE | Admit: 2021-06-14 | Discharge: 2021-06-14 | Disposition: A | Discharge status: Home / Self Care | Payer: Medicare Other | Source: Ambulatory Visit | Attending: Internal Medicine | Admitting: Internal Medicine

## 2021-06-14 DIAGNOSIS — I700 Atherosclerosis of aorta: Secondary | ICD-10-CM | POA: Diagnosis not present

## 2021-06-14 DIAGNOSIS — K6389 Other specified diseases of intestine: Secondary | ICD-10-CM | POA: Diagnosis not present

## 2021-06-14 DIAGNOSIS — R634 Abnormal weight loss: Secondary | ICD-10-CM | POA: Diagnosis not present

## 2021-06-14 DIAGNOSIS — I2510 Atherosclerotic heart disease of native coronary artery without angina pectoris: Secondary | ICD-10-CM | POA: Diagnosis not present

## 2021-06-14 MED ORDER — IOPAMIDOL (ISOVUE-300) INJECTION 61%
100.0000 mL | Freq: Once | INTRAVENOUS | Status: AC | PRN
  Administered 2021-06-14: 100 mL via INTRAVENOUS

## 2021-06-28 ENCOUNTER — Other Ambulatory Visit: Payer: Self-pay

## 2021-07-08 DIAGNOSIS — Z23 Encounter for immunization: Secondary | ICD-10-CM | POA: Diagnosis not present

## 2021-07-18 NOTE — Progress Notes (Signed)
3    07/18/2021 [name] [id number] 10/18/1932   Chief Complaint: Weight loss, abnormal CT scan   History of Present Illness: [name] is an 87 year old male with a past medical history of  hypertension, hyperlipidemia, CAD, iliac artery aneurysm, CKD, kidney stones, PE  04/2018 on Xarelto, myasthenia gravis, GERD and tubular adenomatous colon polyps.  He was last seen by Dr. Henrene Pastor via telephone visit 05/26/2019 due to having diarrhea and weight loss.  At that time, a CTAP was unrevealing, his diarrhea resolved, his appetite improved and he gained back 6 pounds.  He presents to our office today accompanied by his wife for further evaluation regarding an abnormal CTAP with continued weight loss.  He reported losing 13 pounds over the past 4 months (confirmed also by his PCP) and he underwent a repeat CTAP with contrast 06/14/2021 as ordered by Dr. Reynaldo Minium which showed short loop of small bowel within the left mid abdomen with circumferential bowel wall thickening and hyperemia as well as luminal distention with hyperdense fluid was suggestive of inflammatory or infectious enteritis.  No evidence of large bowel wall thickening or dilatation.  He denied having any diarrhea at the time the CT was completed.  He stated his diarrhea stopped 3 to 4 months ago.  He is passing a firm to hard brown stool most days.  No rectal bleeding or black stools.  He denies having any upper or lower abdominal pain.  His appetite is decreased, he is eating smaller meals.  He takes Nexium OTC once weekly for heartburn.  No dysphagia.  No fever, sweats or chills but he often feels cold.  Energy level is okay.  His most recent colonoscopy was 08/16/2009, 1 tubular adenomatous polyp was removed from the sigmoid colon.  A recall colonoscopy in 5 years was recommended but was not done.  He was not seen in our office from 2010 until 05/2019.  At the time of his televisit with Dr. Henrene Pastor 05/26/2019 the patient was 71 and no  further surveillance colon polyp colonoscopies or Cologuard testing were recommended due to his age.   Laboratory studies 05/30/2021: WBC 4.28.  Hemoglobin 12.1.  Hematocrit 36.6.  MCV 95.5.  Platelet 199.  Eos 8.4% ( < 7.8).  Glucose 93.  BUN 15.  Creatinine 1.3.  Alk phos 73.  Total bili 0.8.  AST 17.  ALT 12.  TSH 0.41.  CTAP 06/14/2021 with contrast due to weight loss:  TECHNIQUE: Multidetector CT imaging of the abdomen and pelvis was performed using the standard protocol following bolus administration of intravenous contrast.   Lower chest: No acute abnormality.  Coronary artery calcifications.   Hepatobiliary: No focal liver abnormality. No gallstones, gallbladder wall thickening, or pericholecystic fluid. No biliary dilatation.   Pancreas: No focal lesion. Normal pancreatic contour. No surrounding inflammatory changes. No main pancreatic ductal dilatation.   Spleen: Normal in size without focal abnormality.   Adrenals/Urinary Tract:   No adrenal nodule bilaterally.   Bilateral kidneys enhance symmetrically. Redemonstration of several fluid density lesions within the kidneys that likely represent simple renal cysts. No hydronephrosis. No hydroureter.   The urinary bladder is unremarkable.   Stomach/Bowel: PO contrast reaches the large bowel. Stomach is within normal limits. Likely Under-distended small bowel l within the right upper mid abdomen. Short loop of small bowel within the left mid abdomen demonstrating circumferential bowel wall thickening and hyperemia (6:73) as well as luminal distension with hyperdense fluid. No evidence of large bowel wall  thickening or dilatation. No pneumatosis. Appendix appears normal.   Vascular/Lymphatic: No abdominal aorta or iliac aneurysm. Moderate severe atherosclerotic plaque of the aorta and its branches. No abdominal, pelvic, or inguinal lymphadenopathy.   Reproductive: Heterogeneous and prominent prostate gland.   Other: No  intraperitoneal free fluid. No intraperitoneal free gas. No organized fluid collection.   Musculoskeletal:   No abdominal wall hernia or abnormality.   No suspicious lytic or blastic osseous lesions. No acute displaced fracture. Multilevel severe degenerative changes of the spine.   IMPRESSION: 1. Left mid abdomen small bowel findings suggestive of enteritis. Differential diagnosis of etiology includes inflammation or infection (bacterial or parasitic). 2. Prostatomegaly.  Correlate with PSA levels. 3.  Aortic Atherosclerosis (ICD10-I70.0).    Current Outpatient Medications on File Prior to Visit  Medication Sig Dispense Refill   albuterol (VENTOLIN HFA) 108 (90 Base) MCG/ACT inhaler Inhale 2 puffs into the lungs every 6 (six) hours as needed for wheezing.     esomeprazole (NEXIUM 24HR) 20 MG capsule Take 20 mg by mouth as needed.     lisinopril (PRINIVIL,ZESTRIL) 10 MG tablet Take 10 mg by mouth daily.     meclizine (ANTIVERT) 50 MG tablet Take 1 tablet (50 mg total) by mouth 3 (three) times daily as needed. 30 tablet 0   pravastatin (PRAVACHOL) 20 MG tablet Take 20 mg by mouth daily.     TRELEGY ELLIPTA 100-62.5-25 MCG/INH AEPB Inhale 1 puff into the lungs daily.     XARELTO 20 MG TABS tablet Take 20 mg by mouth daily.     No current facility-administered medications on file prior to visit.   Allergies  Allergen Reactions   Aspirin     REACTION: full strength; causes gi irritation   Iodinated Diagnostic Agents     Other reaction(s): Unknown   Iodine     Other reaction(s): Unknown   Current Outpatient Medications on File Prior to Visit  Medication Sig Dispense Refill   albuterol (VENTOLIN HFA) 108 (90 Base) MCG/ACT inhaler Inhale 2 puffs into the lungs every 6 (six) hours as needed for wheezing.     esomeprazole (NEXIUM 24HR) 20 MG capsule Take 20 mg by mouth as needed.     lisinopril (PRINIVIL,ZESTRIL) 10 MG tablet Take 10 mg by mouth daily.     meclizine (ANTIVERT) 50  MG tablet Take 1 tablet (50 mg total) by mouth 3 (three) times daily as needed. 30 tablet 0   pravastatin (PRAVACHOL) 20 MG tablet Take 20 mg by mouth daily.     TRELEGY ELLIPTA 100-62.5-25 MCG/INH AEPB Inhale 1 puff into the lungs daily.     XARELTO 20 MG TABS tablet Take 20 mg by mouth daily.     No current facility-administered medications on file prior to visit.    Current Medications, Allergies, Past Medical History, Past Surgical History, Family History and Social History were reviewed in [employer] record.  Review of Systems:   Constitutional: Negative for fever, sweats, chills or weight loss.  Respiratory: Negative for shortness of breath.   Cardiovascular: Negative for chest pain, palpitations and leg swelling.  Gastrointestinal: See HPI.  Musculoskeletal: Negative for back pain or muscle aches.  Neurological: Negative for dizziness, headaches or paresthesias.    Physical Exam: There were no vitals taken for this visit.BP (!) 160/68 (BP Location: Left Arm, Patient Position: Sitting, Cuff Size: Normal)   Pulse 60   Ht 5' 7.5" (1.715 m) Comment: height measured without shoes  Wt 163 lb 2  oz (74 kg)   BMI 25.17 kg/m  Weight 160.9 lbs on 05/29/2021 per PCP's office note.  Wt Readings from Last 3 Encounters:  07/19/21 163 lb 2 oz (74 kg)  12/31/18 179 lb (81.2 kg)  10/01/18 180 lb (81.6 kg)    General: 87 year old male in no acute distress. Head: Normocephalic and atraumatic. Eyes: No scleral icterus. Conjunctiva pink . Ears: Normal auditory acuity. Mouth: Upper and lower dentures. No ulcers or lesions.  Lungs: Clear throughout to auscultation. Heart: Regular rate and rhythm, no murmur. Abdomen: Soft, nontender and nondistended. No masses or hepatomegaly. Normal bowel sounds x 4 quadrants.  Rectal: Deferred.  Musculoskeletal: Symmetrical with no gross deformities. Extremities: No edema. Neurological: Alert oriented x 4. No focal deficits.   Psychological: Alert and cooperative. Normal mood and affect  Assessment and Recommendations: 32. 87 year old male with continued weight loss.  Patient reports 13 pound weight loss in the past 3 to 4 months. CTAP 05/2021 showed evidence of short loop of small bowel within the left mid abdomen with circumferential bowel wall thickening, hyperemia and luminal distension with hyperdense fluid suggestive of enteritis. Prior diarrhea resolved 3 to 4 months ago.  No abdominal pain. -Dr. Henrene Pastor to review CTAP, specifically the small bowel findings, await further recommendations per Dr. Henrene Pastor.  -O & P x 1 to rule out a parasitic infection, which is unlikely (eos % is slightly elevated, his wife read the CT impression and was concerned he might have a parasitic infection)  -Diet as tolerated  2. Mild normocytic anemia  -CBC, iron, iron saturation, TIBC and ferritin level  3. CKD  4.  Infrequent heartburn symptoms -Nexium 20 mg OTC 1 p.o. daily as needed  5.  Constipation -MiraLAX as needed  6. History of a PE on Xarelto

## 2021-07-19 ENCOUNTER — Ambulatory Visit (INDEPENDENT_AMBULATORY_CARE_PROVIDER_SITE_OTHER): Payer: Medicare Other | Admitting: Nurse Practitioner

## 2021-07-19 ENCOUNTER — Encounter: Payer: Self-pay | Admitting: Nurse Practitioner

## 2021-07-19 ENCOUNTER — Other Ambulatory Visit (INDEPENDENT_AMBULATORY_CARE_PROVIDER_SITE_OTHER): Payer: Medicare Other

## 2021-07-19 VITALS — BP 160/68 | HR 60 | Ht 67.5 in | Wt 163.1 lb

## 2021-07-19 DIAGNOSIS — D649 Anemia, unspecified: Secondary | ICD-10-CM | POA: Diagnosis not present

## 2021-07-19 DIAGNOSIS — R634 Abnormal weight loss: Secondary | ICD-10-CM

## 2021-07-19 DIAGNOSIS — R935 Abnormal findings on diagnostic imaging of other abdominal regions, including retroperitoneum: Secondary | ICD-10-CM

## 2021-07-19 LAB — CBC
HCT: 35.5 % — ABNORMAL LOW (ref 39.0–52.0)
Hemoglobin: 11.5 g/dL — ABNORMAL LOW (ref 13.0–17.0)
MCHC: 32.6 g/dL (ref 30.0–36.0)
MCV: 91.1 fl (ref 78.0–100.0)
Platelets: 221 10*3/uL (ref 150.0–400.0)
RBC: 3.89 Mil/uL — ABNORMAL LOW (ref 4.22–5.81)
RDW: 15.3 % (ref 11.5–15.5)
WBC: 3.4 10*3/uL — ABNORMAL LOW (ref 4.0–10.5)

## 2021-07-19 NOTE — Patient Instructions (Signed)
Your provider has requested that you go to the basement level for lab work before leaving today. Press "B" on the elevator. The lab is located at the first door on the left as you exit the elevator.  Take over the counter miralax as follows: one capful mixed in 8 oz's of water daily as needed for constipation.   Alcide Evener, CRNP will call you after Dr Marina Goodell reviews your Abdominal/Pelvic CT scan.   I appreciate the opportunity to care for you. Alcide Evener, CRNP

## 2021-07-20 LAB — IRON,TIBC AND FERRITIN PANEL
%SAT: 26 % (ref 20–48)
Ferritin: 102 ng/mL (ref 24–380)
Iron: 68 ug/dL (ref 50–180)
TIBC: 264 ug/dL (ref 250–425)

## 2021-07-20 NOTE — Progress Notes (Signed)
Assessment and plans reviewed.  [name], regarding CT findings, it would be reasonable to repeat a CT scan of the abdomen pelvis in 2 months to reassess the small bowel abnormality for evidence of regression or progression.  Please order this under your name and follow-up on the results.  Keep me in the loop.  Thanks

## 2021-07-20 NOTE — Progress Notes (Signed)
[name], I called the patient and spoke to his wife regarding Dr. Lamar Sprinkles recommendation to repeat his CTAP in 2 months.  Please schedule the patient for an abdominal/pelvic CT with oral and IV contrast around 08/14/2021 to follow-up on a prior CT which showed evidence of enteritis.  These provide the patient with lab order for BMP to check his BUN and creatinine to be completed 2 to 3 days prior to the CT date.  Thank you

## 2021-07-21 ENCOUNTER — Other Ambulatory Visit: Payer: Self-pay

## 2021-07-21 ENCOUNTER — Other Ambulatory Visit: Payer: Medicare Other

## 2021-07-21 DIAGNOSIS — D649 Anemia, unspecified: Secondary | ICD-10-CM | POA: Diagnosis not present

## 2021-07-21 DIAGNOSIS — R935 Abnormal findings on diagnostic imaging of other abdominal regions, including retroperitoneum: Secondary | ICD-10-CM | POA: Diagnosis not present

## 2021-07-21 DIAGNOSIS — R634 Abnormal weight loss: Secondary | ICD-10-CM | POA: Diagnosis not present

## 2021-07-24 ENCOUNTER — Telehealth: Payer: Self-pay

## 2021-07-24 ENCOUNTER — Other Ambulatory Visit: Payer: Self-pay

## 2021-07-24 DIAGNOSIS — K529 Noninfective gastroenteritis and colitis, unspecified: Secondary | ICD-10-CM

## 2021-07-24 DIAGNOSIS — R935 Abnormal findings on diagnostic imaging of other abdominal regions, including retroperitoneum: Secondary | ICD-10-CM

## 2021-07-24 NOTE — Telephone Encounter (Signed)
Spoke with spouse. The patient had a CT scan with contrast on 06/14/21. He did not take prednisone or benadryl before the scan. She states he did not have any problem.

## 2021-07-25 NOTE — Telephone Encounter (Signed)
Patient scheduled for CT Imaging at [facility]. Presently scheduled for 08/14/21 at [address] location. The spouse may change it to the location at [address].  Labs 08/10/21 for kidney functions.

## 2021-07-26 LAB — OVA AND PARASITE EXAMINATION
CONCENTRATE RESULT:: NONE SEEN
MICRO NUMBER:: 12179669
SPECIMEN QUALITY:: ADEQUATE
TRICHROME RESULT:: NONE SEEN

## 2021-08-10 ENCOUNTER — Other Ambulatory Visit (INDEPENDENT_AMBULATORY_CARE_PROVIDER_SITE_OTHER): Payer: Medicare Other

## 2021-08-10 DIAGNOSIS — K529 Noninfective gastroenteritis and colitis, unspecified: Secondary | ICD-10-CM | POA: Diagnosis not present

## 2021-08-10 DIAGNOSIS — R935 Abnormal findings on diagnostic imaging of other abdominal regions, including retroperitoneum: Secondary | ICD-10-CM | POA: Diagnosis not present

## 2021-08-10 LAB — BASIC METABOLIC PANEL
BUN: 14 mg/dL (ref 6–23)
CO2: 30 mEq/L (ref 19–32)
Calcium: 9.4 mg/dL (ref 8.4–10.5)
Chloride: 104 mEq/L (ref 96–112)
Creatinine, Ser: 1.23 mg/dL (ref 0.40–1.50)
GFR: 52.89 mL/min — ABNORMAL LOW (ref >60.00)
Glucose, Bld: 93 mg/dL (ref 70–99)
Potassium: 4 mEq/L (ref 3.5–5.1)
Sodium: 141 mEq/L (ref 135–145)

## 2021-08-14 ENCOUNTER — Other Ambulatory Visit: Payer: Medicare Other

## 2021-08-14 ENCOUNTER — Other Ambulatory Visit: Payer: Self-pay

## 2021-08-14 ENCOUNTER — Ambulatory Visit
Admission: RE | Admit: 2021-08-14 | Discharge: 2021-08-14 | Disposition: A | Discharge status: Home / Self Care | Payer: Medicare Other | Source: Ambulatory Visit | Attending: Nurse Practitioner | Admitting: Nurse Practitioner

## 2021-08-14 DIAGNOSIS — K529 Noninfective gastroenteritis and colitis, unspecified: Secondary | ICD-10-CM

## 2021-08-14 DIAGNOSIS — R634 Abnormal weight loss: Secondary | ICD-10-CM

## 2021-08-14 DIAGNOSIS — R935 Abnormal findings on diagnostic imaging of other abdominal regions, including retroperitoneum: Secondary | ICD-10-CM

## 2021-08-14 DIAGNOSIS — N281 Cyst of kidney, acquired: Secondary | ICD-10-CM | POA: Diagnosis not present

## 2021-09-13 DIAGNOSIS — Z23 Encounter for immunization: Secondary | ICD-10-CM | POA: Diagnosis not present

## 2021-09-13 DIAGNOSIS — I2699 Other pulmonary embolism without acute cor pulmonale: Secondary | ICD-10-CM | POA: Diagnosis not present

## 2021-09-27 DIAGNOSIS — Z125 Encounter for screening for malignant neoplasm of prostate: Secondary | ICD-10-CM | POA: Diagnosis not present

## 2021-09-27 DIAGNOSIS — I10 Essential (primary) hypertension: Secondary | ICD-10-CM | POA: Diagnosis not present

## 2021-09-27 DIAGNOSIS — E1151 Type 2 diabetes mellitus with diabetic peripheral angiopathy without gangrene: Secondary | ICD-10-CM | POA: Diagnosis not present

## 2021-09-27 DIAGNOSIS — E785 Hyperlipidemia, unspecified: Secondary | ICD-10-CM | POA: Diagnosis not present

## 2021-10-12 DIAGNOSIS — E785 Hyperlipidemia, unspecified: Secondary | ICD-10-CM | POA: Diagnosis not present

## 2021-10-12 DIAGNOSIS — Z1331 Encounter for screening for depression: Secondary | ICD-10-CM | POA: Diagnosis not present

## 2021-10-12 DIAGNOSIS — I130 Hypertensive heart and chronic kidney disease with heart failure and stage 1 through stage 4 chronic kidney disease, or unspecified chronic kidney disease: Secondary | ICD-10-CM | POA: Diagnosis not present

## 2021-10-12 DIAGNOSIS — N400 Enlarged prostate without lower urinary tract symptoms: Secondary | ICD-10-CM | POA: Diagnosis not present

## 2021-10-12 DIAGNOSIS — I700 Atherosclerosis of aorta: Secondary | ICD-10-CM | POA: Diagnosis not present

## 2021-10-12 DIAGNOSIS — R972 Elevated prostate specific antigen [PSA]: Secondary | ICD-10-CM | POA: Diagnosis not present

## 2021-10-12 DIAGNOSIS — I2699 Other pulmonary embolism without acute cor pulmonale: Secondary | ICD-10-CM | POA: Diagnosis not present

## 2021-10-12 DIAGNOSIS — G609 Hereditary and idiopathic neuropathy, unspecified: Secondary | ICD-10-CM | POA: Diagnosis not present

## 2021-10-12 DIAGNOSIS — I10 Essential (primary) hypertension: Secondary | ICD-10-CM | POA: Diagnosis not present

## 2021-10-12 DIAGNOSIS — Z0000 Encounter for general adult medical examination without abnormal findings: Secondary | ICD-10-CM | POA: Diagnosis not present

## 2021-10-12 DIAGNOSIS — Z1339 Encounter for screening examination for other mental health and behavioral disorders: Secondary | ICD-10-CM | POA: Diagnosis not present

## 2021-10-12 DIAGNOSIS — E1129 Type 2 diabetes mellitus with other diabetic kidney complication: Secondary | ICD-10-CM | POA: Diagnosis not present

## 2021-10-12 DIAGNOSIS — E1140 Type 2 diabetes mellitus with diabetic neuropathy, unspecified: Secondary | ICD-10-CM | POA: Diagnosis not present

## 2021-10-12 DIAGNOSIS — R82998 Other abnormal findings in urine: Secondary | ICD-10-CM | POA: Diagnosis not present

## 2021-11-20 DIAGNOSIS — R972 Elevated prostate specific antigen [PSA]: Secondary | ICD-10-CM | POA: Diagnosis not present

## 2021-11-23 ENCOUNTER — Ambulatory Visit (HOSPITAL_COMMUNITY)
Admission: EM | Admit: 2021-11-23 | Discharge: 2021-11-23 | Disposition: A | Discharge status: Home / Self Care | Payer: Medicare Other

## 2021-11-23 ENCOUNTER — Encounter (HOSPITAL_COMMUNITY): Payer: Self-pay

## 2021-11-23 ENCOUNTER — Other Ambulatory Visit: Payer: Self-pay

## 2021-11-23 DIAGNOSIS — I10 Essential (primary) hypertension: Secondary | ICD-10-CM | POA: Diagnosis not present

## 2021-11-23 NOTE — ED Triage Notes (Signed)
Pt reports concerns of HBP. States he noticed his blood pressure was high since Monday.   Pt states he has had some headache.

## 2021-11-23 NOTE — Discharge Instructions (Signed)
Please call doctor Ar[place] office in the morning for a follow up appointment  If you increased medication dose to 20 mg take at the same time every day and record. Monitor for signs of low blood pressure such a dizziness lightheadedness weakness  If blood pressure if elevated and you experience shortness of breath, chest pain ,vomiting, worsening vision go to the nearest emergency department   Decrease salt intake over the next week and increase fluid intake

## 2021-11-23 NOTE — ED Provider Notes (Signed)
[place]    CSN: [id number] Arrival date & time: 11/23/21  1946      History   Chief Complaint Chief Complaint  Patient presents with   Hypertension    HPI [name]. is a 87 y.o. male.   Patient presents with elevated blood pressure for 4 days and mild generalized headache described as fogginess.  Noticed his blood pressure was elevated at urologist appointment on Monday and has been tracking it ever since.  Endorses that he takes his daily medication as prescribed.  No recent changes to medications.,  No recent changes to diet however he did have a few salty foods on Thanksgiving.  Endorses baseline visual disturbance but has not worsened recently.  Denies chest pain, shortness of breath, abdominal pain, nausea, vomiting, weakness, slurred speech, dizziness, lightheadedness.  History of CKD, and diabetes, GERD, PE, hypertension, myasthenia gravis, arthrosclerosis.     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   Osteoarthritis    Pulmonary embolism [facility])     Patient Active Problem List   Diagnosis Date Noted   Hyperglycemia 05/20/2018   Hypertension    Chest pain    Pulmonary embolism (HCC)    GERD (gastroesophageal reflux disease)    Coronary atherosclerosis    Aortic atherosclerosis (HCC)    Iliac artery aneurysm ([place])    Myasthenia gravis ([place]) 03/29/2017    Past Surgical History:  Procedure Laterality Date   KNEE SURGERY     LITHOTRIPSY     PROSTATE SURGERY         Home Medications    Prior to  Admission medications   Medication Sig Start Date End Date Taking? Authorizing Provider  albuterol (VENTOLIN HFA) 108 (90 Base) MCG/ACT inhaler Inhale 2 puffs into the lungs every 6 (six) hours as needed for wheezing. 03/04/20   [provider]  esomeprazole (NEXIUM 24HR) 20 MG capsule Take 20 mg by mouth as needed.    [provider]  lisinopril (PRINIVIL,ZESTRIL) 10 MG tablet Take 10 mg by mouth daily.    [provider]  meclizine (ANTIVERT) 50 MG tablet Take 1 tablet (50 mg total) by mouth 3 (three) times daily as needed. 05/22/20   Deno Etienne, DO  pravastatin (PRAVACHOL) 20 MG tablet Take 20 mg by mouth daily.    [provider]  TRELEGY ELLIPTA 100-62.5-25 MCG/INH AEPB Inhale 1 puff into the lungs daily. 04/12/21   [provider]  XARELTO 20 MG TABS tablet Take 20 mg by mouth daily. 05/29/21   [provider]    Family History Family History  Problem Relation Age of Onset   Heart attack Father    Sarcoidosis Brother     Social History Social History   Tobacco Use   Smoking status: Former   Smokeless tobacco: Former    Types: Chew   Tobacco comments:    Quit about 30 years ago  Media planner   Vaping Use: Never used  Substance  Use Topics   Alcohol use: No   Drug use: No     Allergies   Aspirin, Iodinated diagnostic agents, and Iodine   Review of Systems Review of Systems  Constitutional: Negative.   Respiratory: Negative.    Cardiovascular: Negative.   Genitourinary: Negative.   Skin: Negative.   Neurological:  Positive for headaches. Negative for dizziness, tremors, seizures, syncope, facial asymmetry, speech difficulty, weakness, light-headedness and numbness.    Physical Exam Triage Vital Signs ED Triage Vitals  Enc Vitals Group     BP 11/23/21 2008 (!) 171/82     Pulse Rate 11/23/21 2007 62     Resp 11/23/21 2007 15     Temp 11/23/21 2007 98.2 F (36.8 C)     Temp Source 11/23/21 2007 Oral     SpO2  11/23/21 2007 96 %     Weight --      Height --      Head Circumference --      Peak Flow --      Pain Score 11/23/21 2005 0     Pain Loc --      Pain Edu? --      Excl. in GC? --    No data found.  Updated Vital Signs BP (!) 171/82 (BP Location: Right Arm)   Pulse 62   Temp 98.2 F (36.8 C) (Oral)   Resp 15   SpO2 96%   Visual Acuity Right Eye Distance:   Left Eye Distance:   Bilateral Distance:    Right Eye Near:   Left Eye Near:    Bilateral Near:     Physical Exam Constitutional:      Appearance: Normal appearance. He is normal weight.  Eyes:     Extraocular Movements: Extraocular movements intact.  Cardiovascular:     Rate and Rhythm: Normal rate and regular rhythm.     Pulses: Normal pulses.     Heart sounds: Normal heart sounds.  Pulmonary:     Effort: Pulmonary effort is normal.     Breath sounds: Normal breath sounds.  Skin:    General: Skin is warm and dry.  Neurological:     Mental Status: He is alert and oriented to person, place, and time. Mental status is at baseline.  Psychiatric:        Mood and Affect: Mood normal.        Behavior: Behavior normal.     UC Treatments / Results  Labs (all labs ordered are listed, but only abnormal results are displayed) Labs Reviewed - No data to display  EKG   Radiology No results found.  Procedures Procedures (including critical care time)  Medications Ordered in UC Medications - No data to display  Initial Impression / Assessment and Plan / UC Course  I have reviewed the triage vital signs and the nursing notes.  Pertinent labs & imaging results that were available during my care of the patient were reviewed by me and considered in my medical decision making (see chart for details).  Hypertension  Patient is stable, no signs of distress, blood pressure elevated at 171/82 in triage which is now classified as urgency, heart is beating at a regular pace and rhythm, discussed findings with  patient and wife, recommended follow-up with PCP as soon as possible for further evaluation, patient wanting to increase lisinopril dosage from 10 mg to 20, did not recommend, patient discussing that they feel this will be beneficial, advised patient that if they adjust  medications that it needs to be taken at the same time daily and blood pressure needs to be tracked until seen by primary care doctor, given signs of hypotension to monitor for, given strict precautions for worsening symptoms to go to the nearest emergency department for further evaluation Final Clinical Impressions(s) / UC Diagnoses   Final diagnoses:  None   Discharge Instructions   None    ED Prescriptions   None    PDMP not reviewed this encounter.   Hans Eden, [place] 11/26/21 [phone]

## 2021-11-26 ENCOUNTER — Other Ambulatory Visit: Payer: Self-pay

## 2021-11-26 ENCOUNTER — Emergency Department (HOSPITAL_COMMUNITY): Payer: Medicare Other

## 2021-11-26 ENCOUNTER — Observation Stay (HOSPITAL_COMMUNITY)
Admission: EM | Admit: 2021-11-26 | Discharge: 2021-11-28 | Disposition: A | Discharge status: Home / Self Care | Payer: Medicare Other | Attending: Internal Medicine | Admitting: Internal Medicine

## 2021-11-26 ENCOUNTER — Encounter (HOSPITAL_COMMUNITY): Payer: Self-pay

## 2021-11-26 DIAGNOSIS — Z87891 Personal history of nicotine dependence: Secondary | ICD-10-CM | POA: Insufficient documentation

## 2021-11-26 DIAGNOSIS — I10 Essential (primary) hypertension: Secondary | ICD-10-CM | POA: Diagnosis not present

## 2021-11-26 DIAGNOSIS — G7000 Myasthenia gravis without (acute) exacerbation: Secondary | ICD-10-CM | POA: Diagnosis not present

## 2021-11-26 DIAGNOSIS — Z86711 Personal history of pulmonary embolism: Secondary | ICD-10-CM | POA: Diagnosis present

## 2021-11-26 DIAGNOSIS — E872 Acidosis, unspecified: Secondary | ICD-10-CM | POA: Diagnosis not present

## 2021-11-26 DIAGNOSIS — R2681 Unsteadiness on feet: Secondary | ICD-10-CM | POA: Diagnosis not present

## 2021-11-26 DIAGNOSIS — R1013 Epigastric pain: Secondary | ICD-10-CM | POA: Diagnosis present

## 2021-11-26 DIAGNOSIS — E782 Mixed hyperlipidemia: Secondary | ICD-10-CM | POA: Diagnosis not present

## 2021-11-26 DIAGNOSIS — R519 Headache, unspecified: Secondary | ICD-10-CM | POA: Insufficient documentation

## 2021-11-26 DIAGNOSIS — E119 Type 2 diabetes mellitus without complications: Secondary | ICD-10-CM | POA: Diagnosis not present

## 2021-11-26 DIAGNOSIS — N400 Enlarged prostate without lower urinary tract symptoms: Secondary | ICD-10-CM | POA: Diagnosis not present

## 2021-11-26 DIAGNOSIS — K4090 Unilateral inguinal hernia, without obstruction or gangrene, not specified as recurrent: Secondary | ICD-10-CM | POA: Diagnosis not present

## 2021-11-26 DIAGNOSIS — Z79899 Other long term (current) drug therapy: Secondary | ICD-10-CM | POA: Insufficient documentation

## 2021-11-26 DIAGNOSIS — Z20822 Contact with and (suspected) exposure to covid-19: Secondary | ICD-10-CM | POA: Insufficient documentation

## 2021-11-26 DIAGNOSIS — K5900 Constipation, unspecified: Secondary | ICD-10-CM | POA: Diagnosis present

## 2021-11-26 DIAGNOSIS — Z7901 Long term (current) use of anticoagulants: Secondary | ICD-10-CM | POA: Insufficient documentation

## 2021-11-26 DIAGNOSIS — N182 Chronic kidney disease, stage 2 (mild): Secondary | ICD-10-CM | POA: Insufficient documentation

## 2021-11-26 DIAGNOSIS — I160 Hypertensive urgency: Secondary | ICD-10-CM | POA: Diagnosis not present

## 2021-11-26 DIAGNOSIS — R0789 Other chest pain: Secondary | ICD-10-CM | POA: Diagnosis not present

## 2021-11-26 DIAGNOSIS — K819 Cholecystitis, unspecified: Secondary | ICD-10-CM

## 2021-11-26 DIAGNOSIS — I723 Aneurysm of iliac artery: Secondary | ICD-10-CM | POA: Diagnosis not present

## 2021-11-26 DIAGNOSIS — Z794 Long term (current) use of insulin: Secondary | ICD-10-CM | POA: Diagnosis not present

## 2021-11-26 DIAGNOSIS — E1122 Type 2 diabetes mellitus with diabetic chronic kidney disease: Secondary | ICD-10-CM | POA: Insufficient documentation

## 2021-11-26 DIAGNOSIS — R079 Chest pain, unspecified: Secondary | ICD-10-CM | POA: Diagnosis not present

## 2021-11-26 DIAGNOSIS — R072 Precordial pain: Principal | ICD-10-CM | POA: Insufficient documentation

## 2021-11-26 DIAGNOSIS — R112 Nausea with vomiting, unspecified: Secondary | ICD-10-CM

## 2021-11-26 DIAGNOSIS — R109 Unspecified abdominal pain: Secondary | ICD-10-CM

## 2021-11-26 DIAGNOSIS — I2510 Atherosclerotic heart disease of native coronary artery without angina pectoris: Secondary | ICD-10-CM | POA: Diagnosis not present

## 2021-11-26 DIAGNOSIS — K219 Gastro-esophageal reflux disease without esophagitis: Secondary | ICD-10-CM | POA: Diagnosis present

## 2021-11-26 DIAGNOSIS — R030 Elevated blood-pressure reading, without diagnosis of hypertension: Secondary | ICD-10-CM

## 2021-11-26 DIAGNOSIS — J929 Pleural plaque without asbestos: Secondary | ICD-10-CM | POA: Diagnosis not present

## 2021-11-26 DIAGNOSIS — I129 Hypertensive chronic kidney disease with stage 1 through stage 4 chronic kidney disease, or unspecified chronic kidney disease: Secondary | ICD-10-CM | POA: Insufficient documentation

## 2021-11-26 LAB — CBC
HCT: 40.4 % (ref 39.0–52.0)
Hemoglobin: 12.9 g/dL — ABNORMAL LOW (ref 13.0–17.0)
MCH: 29.5 pg (ref 26.0–34.0)
MCHC: 31.9 g/dL (ref 30.0–36.0)
MCV: 92.4 fL (ref 80.0–100.0)
Platelets: 262 10*3/uL (ref 150–400)
RBC: 4.37 MIL/uL (ref 4.22–5.81)
RDW: 15.1 % (ref 11.5–15.5)
WBC: 4 10*3/uL (ref 4.0–10.5)
nRBC: 0 % (ref 0.0–0.2)

## 2021-11-26 LAB — RESP PANEL BY RT-PCR (FLU A&B, COVID) ARPGX2
Influenza A by PCR: NEGATIVE
Influenza B by PCR: NEGATIVE
SARS Coronavirus 2 by RT PCR: NEGATIVE

## 2021-11-26 LAB — HEPATIC FUNCTION PANEL
ALT: 13 U/L (ref 0–44)
AST: 18 U/L (ref 15–41)
Albumin: 3.7 g/dL (ref 3.5–5.0)
Alkaline Phosphatase: 90 U/L (ref 38–126)
Bilirubin, Direct: 0.2 mg/dL (ref 0.0–0.2)
Indirect Bilirubin: 0.5 mg/dL (ref 0.3–0.9)
Total Bilirubin: 0.7 mg/dL (ref 0.3–1.2)
Total Protein: 7 g/dL (ref 6.5–8.1)

## 2021-11-26 LAB — PROCALCITONIN: Procalcitonin: <0.1 ng/mL

## 2021-11-26 LAB — LACTIC ACID, PLASMA: Lactic Acid, Venous: 3.5 mmol/L (ref 0.5–1.9)

## 2021-11-26 LAB — BASIC METABOLIC PANEL
Anion gap: 8 (ref 5–15)
BUN: 10 mg/dL (ref 8–23)
CO2: 30 mmol/L (ref 22–32)
Calcium: 9.8 mg/dL (ref 8.9–10.3)
Chloride: 99 mmol/L (ref 98–111)
Creatinine, Ser: 1.25 mg/dL — ABNORMAL HIGH (ref 0.61–1.24)
GFR, Estimated: 56 mL/min — ABNORMAL LOW (ref >60)
Glucose, Bld: 109 mg/dL — ABNORMAL HIGH (ref 70–99)
Potassium: 4.8 mmol/L (ref 3.5–5.1)
Sodium: 137 mmol/L (ref 135–145)

## 2021-11-26 LAB — LIPASE, BLOOD: Lipase: 32 U/L (ref 11–51)

## 2021-11-26 LAB — TROPONIN I (HIGH SENSITIVITY)
Troponin I (High Sensitivity): 12 ng/L (ref <18)
Troponin I (High Sensitivity): 14 ng/L (ref <18)

## 2021-11-26 LAB — PROTIME-INR
INR: 1.2 (ref 0.8–1.2)
Prothrombin Time: 15.1 seconds (ref 11.4–15.2)

## 2021-11-26 LAB — CBG MONITORING, ED: Glucose-Capillary: 120 mg/dL — ABNORMAL HIGH (ref 70–99)

## 2021-11-26 MED ORDER — PANTOPRAZOLE SODIUM 40 MG IV SOLR
40.0000 mg | Freq: Once | INTRAVENOUS | Status: AC
  Administered 2021-11-26: 23:00:00 40 mg via INTRAVENOUS
  Filled 2021-11-26: qty 40

## 2021-11-26 MED ORDER — UMECLIDINIUM BROMIDE 62.5 MCG/ACT IN AEPB
1.0000 | INHALATION_SPRAY | Freq: Every day | RESPIRATORY_TRACT | Status: DC
  Administered 2021-11-28: 1 via RESPIRATORY_TRACT
  Filled 2021-11-26: qty 7

## 2021-11-26 MED ORDER — POLYETHYLENE GLYCOL 3350 17 G PO PACK: 17.0000 g | PACK | Freq: Every day | ORAL | Status: DC | PRN

## 2021-11-26 MED ORDER — FLUTICASONE-UMECLIDIN-VILANT 100-62.5-25 MCG/ACT IN AEPB: 1.0000 | INHALATION_SPRAY | Freq: Every day | RESPIRATORY_TRACT | Status: DC

## 2021-11-26 MED ORDER — ACETAMINOPHEN 650 MG RE SUPP: 650.0000 mg | Freq: Four times a day (QID) | RECTAL | Status: DC | PRN

## 2021-11-26 MED ORDER — INSULIN ASPART 100 UNIT/ML IJ SOLN
0.0000 [IU] | Freq: Three times a day (TID) | INTRAMUSCULAR | Status: DC
Start: 2021-11-27 — End: 2021-11-28

## 2021-11-26 MED ORDER — ONDANSETRON HCL 4 MG/2ML IJ SOLN
4.0000 mg | Freq: Once | INTRAMUSCULAR | Status: AC
  Administered 2021-11-26: 16:00:00 4 mg via INTRAVENOUS
  Filled 2021-11-26: qty 2

## 2021-11-26 MED ORDER — LACTATED RINGERS IV BOLUS
500.0000 mL | Freq: Once | INTRAVENOUS | Status: AC
  Administered 2021-11-26: 500 mL via INTRAVENOUS

## 2021-11-26 MED ORDER — MORPHINE SULFATE (PF) 4 MG/ML IV SOLN
4.0000 mg | Freq: Once | INTRAVENOUS | Status: AC
  Administered 2021-11-26: 16:00:00 4 mg via INTRAVENOUS
  Filled 2021-11-26: qty 1

## 2021-11-26 MED ORDER — LABETALOL HCL 5 MG/ML IV SOLN
10.0000 mg | Freq: Once | INTRAVENOUS | Status: AC
  Administered 2021-11-26: 16:00:00 10 mg via INTRAVENOUS
  Filled 2021-11-26: qty 4

## 2021-11-26 MED ORDER — LACTATED RINGERS IV BOLUS
500.0000 mL | Freq: Once | INTRAVENOUS | Status: AC
  Administered 2021-11-26: 21:00:00 500 mL via INTRAVENOUS

## 2021-11-26 MED ORDER — ACETAMINOPHEN 325 MG PO TABS: 650.0000 mg | ORAL_TABLET | Freq: Four times a day (QID) | ORAL | Status: DC | PRN

## 2021-11-26 MED ORDER — IOHEXOL 350 MG/ML SOLN
100.0000 mL | Freq: Once | INTRAVENOUS | Status: AC | PRN
  Administered 2021-11-26: 17:00:00 100 mL via INTRAVENOUS

## 2021-11-26 MED ORDER — PROCHLORPERAZINE EDISYLATE 10 MG/2ML IJ SOLN
5.0000 mg | Freq: Once | INTRAMUSCULAR | Status: AC
  Administered 2021-11-26: 21:00:00 5 mg via INTRAVENOUS
  Filled 2021-11-26: qty 2

## 2021-11-26 MED ORDER — PRAVASTATIN SODIUM 10 MG PO TABS
20.0000 mg | ORAL_TABLET | Freq: Every day | ORAL | Status: DC
  Administered 2021-11-27 – 2021-11-28 (×2): 20 mg via ORAL
  Filled 2021-11-26 (×2): qty 2

## 2021-11-26 MED ORDER — ONDANSETRON HCL 4 MG/2ML IJ SOLN
4.0000 mg | Freq: Once | INTRAMUSCULAR | Status: AC
  Administered 2021-11-26: 20:00:00 4 mg via INTRAVENOUS
  Filled 2021-11-26: qty 2

## 2021-11-26 MED ORDER — ALBUTEROL SULFATE (2.5 MG/3ML) 0.083% IN NEBU: 2.5000 mg | INHALATION_SOLUTION | Freq: Four times a day (QID) | RESPIRATORY_TRACT | Status: DC | PRN

## 2021-11-26 MED ORDER — FLUTICASONE FUROATE-VILANTEROL 100-25 MCG/ACT IN AEPB
1.0000 | INHALATION_SPRAY | Freq: Every day | RESPIRATORY_TRACT | Status: DC
  Administered 2021-11-28: 1 via RESPIRATORY_TRACT
  Filled 2021-11-26: qty 28

## 2021-11-26 MED ORDER — ALUM & MAG HYDROXIDE-SIMETH 200-200-20 MG/5ML PO SUSP
30.0000 mL | Freq: Once | ORAL | Status: AC
  Administered 2021-11-26: 23:00:00 30 mL via ORAL
  Filled 2021-11-26: qty 30

## 2021-11-26 MED ORDER — ONDANSETRON HCL 4 MG/2ML IJ SOLN: 4.0000 mg | Freq: Four times a day (QID) | INTRAMUSCULAR | Status: DC | PRN

## 2021-11-26 MED ORDER — RIVAROXABAN 20 MG PO TABS
20.0000 mg | ORAL_TABLET | Freq: Every day | ORAL | Status: DC
  Administered 2021-11-27: 20 mg via ORAL
  Filled 2021-11-26: qty 1

## 2021-11-26 MED ORDER — LISINOPRIL 10 MG PO TABS: 10.0000 mg | ORAL_TABLET | Freq: Every day | ORAL | Status: DC

## 2021-11-26 MED ORDER — HYDRALAZINE HCL 20 MG/ML IJ SOLN
10.0000 mg | Freq: Once | INTRAMUSCULAR | Status: AC
  Administered 2021-11-26: 20:00:00 10 mg via INTRAVENOUS
  Filled 2021-11-26: qty 1

## 2021-11-26 MED ORDER — ONDANSETRON HCL 4 MG PO TABS: 4.0000 mg | ORAL_TABLET | Freq: Four times a day (QID) | ORAL | Status: DC | PRN

## 2021-11-26 NOTE — ED Provider Notes (Signed)
[facility] Provider Note   CSN: [id number] Arrival date & time: 11/26/21  1301     History No chief complaint on file.   [name]. is a 87 y.o. male.  The history is provided by the patient and medical records. No language interpreter was used.  Chest Pain Pain location:  Substernal area Pain quality: aching   Pain radiates to:  Mid back and upper back Pain severity:  Moderate Timing:  Constant Progression:  Waxing and waning Chronicity:  New Relieved by:  Nothing Worsened by:  Nothing Ineffective treatments:  None tried Associated symptoms: abdominal pain, back pain, cough, nausea, shortness of breath and vomiting   Associated symptoms: no altered mental status, no fatigue, no fever, no headache, no lower extremity edema, no numbness and no palpitations       Past Medical History:  Diagnosis Date   Aortic atherosclerosis (HCC)    Chest pain    Chronic kidney disease (CKD), stage II (mild)    Coronary atherosclerosis    DM2 (diabetes mellitus, type 2) (HCC)    GERD (gastroesophageal reflux disease)    Hydronephrosis    on left   Hyperlipidemia    Hypertension    Iliac artery aneurysm (HCC)    Kidney stone    Myasthenia gravis ([place]) 03/29/2017   Ocular myasthenia gravis ([place])    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)     Patient Active Problem List   Diagnosis Date Noted   Hyperglycemia 05/20/2018   Hypertension    Chest pain    Pulmonary embolism (HCC)    GERD (gastroesophageal reflux disease)    Coronary atherosclerosis    Aortic atherosclerosis (HCC)    Iliac artery aneurysm (HCC)    Myasthenia gravis (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 Authorizing Provider  albuterol (VENTOLIN HFA) 108 (90 Base) MCG/ACT inhaler Inhale 2 puffs into the lungs every 6 (six) hours as needed for wheezing. 03/04/20   [provider]  esomeprazole (NEXIUM 24HR) 20 MG capsule Take 20 mg by mouth as needed.    [provider]  lisinopril (PRINIVIL,ZESTRIL) 10 MG tablet Take 10 mg by mouth daily.    [provider]  meclizine (ANTIVERT) 50 MG tablet Take 1 tablet (50 mg total) by mouth 3 (three) times daily as needed. 05/22/20   Deno Etienne, DO  pravastatin (PRAVACHOL) 20 MG tablet Take 20 mg by mouth daily.    [provider]  TRELEGY ELLIPTA 100-62.5-25 MCG/INH AEPB Inhale 1 puff into the lungs daily. 04/12/21   [provider]  XARELTO 20 MG TABS tablet Take 20 mg by mouth daily. 05/29/21   [provider]    Allergies  Aspirin, Iodinated diagnostic agents, and Iodine  Review of Systems   Review of Systems  Constitutional:  Negative for chills, fatigue and fever.  HENT:  Negative for congestion.   Respiratory:  Positive for cough, chest tightness and shortness of breath. Negative for wheezing.   Cardiovascular:  Positive for chest pain. Negative for palpitations and leg swelling.  Gastrointestinal:  Positive for abdominal pain, nausea and vomiting. Negative for constipation and diarrhea.  Genitourinary:  Negative for dysuria and flank pain.  Musculoskeletal:  Positive for back pain. Negative for neck pain and neck stiffness.  Skin:  Negative for rash.  Neurological:  Negative for light-headedness, numbness and headaches.  Psychiatric/Behavioral:  Negative for agitation.    All other systems reviewed and are negative.  Physical Exam Updated Vital Signs BP (!) 194/91 (BP Location: Left Arm)   Pulse 70   Temp 98.1 F (36.7 C) (Oral)   Resp 17   SpO2 98%   Physical Exam Vitals and nursing note reviewed.  Constitutional:      General: He is not in acute distress.    Appearance: He is well-developed. He is not ill-appearing, toxic-appearing or diaphoretic.  HENT:     Head: Normocephalic and atraumatic.     Nose: No congestion or rhinorrhea.     Mouth/Throat:     Mouth: Mucous membranes are moist.     Pharynx: No oropharyngeal exudate or posterior oropharyngeal erythema.  Eyes:     Extraocular Movements: Extraocular movements intact.     Conjunctiva/sclera: Conjunctivae normal.     Pupils: Pupils are equal, round, and reactive to light.  Cardiovascular:     Rate and Rhythm: Normal rate and regular rhythm.     Heart sounds: No murmur heard. Pulmonary:     Effort: Pulmonary effort is normal. No respiratory distress.     Breath sounds: Normal breath sounds. No wheezing, rhonchi or rales.  Chest:     Chest wall: No tenderness.  Abdominal:     General: Abdomen is flat.     Palpations: Abdomen is soft.     Tenderness: There is abdominal tenderness. There is no guarding or rebound.  Musculoskeletal:        General: No swelling or tenderness.     Cervical back: Neck supple.     Right lower leg: No edema.     Left lower leg: No edema.  Skin:    General: Skin is warm and dry.     Capillary Refill: Capillary refill takes less than 2 seconds.     Findings: No erythema.  Neurological:     General: No focal deficit present.     Mental Status: He is alert.     Sensory: No sensory deficit.     Motor: No weakness.  Psychiatric:        Mood and Affect: Mood normal.    ED Results / Procedures / Treatments   Labs (all labs ordered are listed, but only abnormal results are displayed) Labs Reviewed  BASIC METABOLIC PANEL - Abnormal; Notable for the  following components:      Result Value   Glucose, Bld 109 (*)    Creatinine, Ser 1.25 (*)    GFR, Estimated 56 (*)    All other components within normal limits  CBC - Abnormal; Notable for the following components:   Hemoglobin 12.9 (*)    All other components within normal limits  LACTIC ACID, PLASMA - Abnormal; Notable for the following components:   Lactic Acid, Venous  3.5 (*)    All other components within normal limits  CBG MONITORING, ED - Abnormal; Notable for the following components:   Glucose-Capillary 120 (*)    All other components within normal limits  RESP PANEL BY RT-PCR (FLU A&B, COVID) ARPGX2  CULTURE, BLOOD (ROUTINE X 2)  CULTURE, BLOOD (ROUTINE X 2)  PROTIME-INR  HEPATIC FUNCTION PANEL  LIPASE, BLOOD  PROCALCITONIN  APTT  URINALYSIS, COMPLETE (UACMP) WITH MICROSCOPIC  C-REACTIVE PROTEIN  HEMOGLOBIN A1C  COMPREHENSIVE METABOLIC PANEL  MAGNESIUM  CBC WITH DIFFERENTIAL/PLATELET  LACTIC ACID, PLASMA  TROPONIN I (HIGH SENSITIVITY)  TROPONIN I (HIGH SENSITIVITY)    EKG EKG Interpretation  Date/Time:  Sunday November 26 2021 13:12:06 EST Ventricular Rate:  67 PR Interval:  140 QRS Duration: 84 QT Interval:  380 QTC Calculation: 401 R Axis:   -37 Text Interpretation: Normal sinus rhythm with sinus arrhythmia Left axis deviation Anteroseptal infarct , age undetermined Abnormal ECG When compared to prior, similar appearance with ST elevations in V3 in may 2019. No STEMI Confirmed by Antony Blackbird [phone]) on 11/26/2021 3:06:55 PM  Radiology DG Chest 2 View  Result Date: 11/26/2021 CLINICAL DATA:  Chest pain. EXAM: CHEST - 2 VIEW COMPARISON:  Chest x-ray dated 10/01/2018. FINDINGS: Heart size and mediastinal contours are stable. Lungs are clear. No pleural effusion or pneumothorax is seen. Osseous structures about the chest are unremarkable. IMPRESSION: No active cardiopulmonary disease. No evidence of pneumonia or pulmonary edema. Electronically Signed   By:  Franki Cabot M.D.   On: 11/26/2021 15:20   CT Angio Chest/Abd/Pel for Dissection W and/or Wo Contrast  Result Date: 11/26/2021 CLINICAL DATA:  Chest and abdominal pain radiating to the back, hypertension. EXAM: CT ANGIOGRAPHY CHEST, ABDOMEN AND PELVIS TECHNIQUE: Non-contrast CT of the chest was initially obtained. Multidetector CT imaging through the chest, abdomen and pelvis was performed using the standard protocol during bolus administration of intravenous contrast. Multiplanar reconstructed images and MIPs were obtained and reviewed to evaluate the vascular anatomy. CONTRAST:  172mL OMNIPAQUE IOHEXOL 350 MG/ML SOLN COMPARISON:  CT August 14, 2021 and October 02, 2018. FINDINGS: CTA CHEST FINDINGS Cardiovascular: Non contrasted examination demonstrates aortic and branch vessel atherosclerosis without intramural hematoma. No thoracic aortic aneurysm or dissection. No pulmonary embolus. Normal size heart. Three-vessel coronary artery calcifications. No significant pericardial effusion/thickening. Mediastinum/Nodes: No discrete thyroid nodule. No pathologically enlarged mediastinal, hilar or axillary lymph nodes. The trachea and esophagus are unremarkable. Lungs/Pleura: Mild diffuse bronchial wall thickening with areas of mucoid impaction. Tiny 2-3 mm or smaller right lower lobe pulmonary nodules some of which are in a tree in bud distribution. Irregular 8 mm in the paramedian right lower lobe on image 84/8 with surrounding ground-glass opacities. No pleural effusion. No pneumothorax. Musculoskeletal: Multilevel degenerative changes spine. No aggressive lytic or blastic lesion of bone. Review of the MIP images confirms the above findings. CTA ABDOMEN AND PELVIS FINDINGS VASCULAR Aorta: Aortic atherosclerosis. Normal caliber aorta without aneurysm, dissection, vasculitis or significant stenosis. Celiac: Patent without evidence of aneurysm, dissection, vasculitis or significant stenosis. SMA: Patent without  evidence of aneurysm, dissection, vasculitis or significant stenosis. Renals: Both renal arteries are patent without evidence of aneurysm, dissection, vasculitis, fibromuscular dysplasia or significant stenosis. IMA: Patent without evidence of aneurysm, dissection, vasculitis or significant stenosis. Inflow: Stable slight dilation of the left common iliac artery measuring 18 mm. Otherwise patent without evidence of dissection, vasculitis or significant stenosis. Veins: No obvious venous abnormality within the limitations of this arterial phase study. Review of  the MIP images confirms the above findings. NON-VASCULAR Hepatobiliary: No suspicious hepatic lesion. Gallbladder is unremarkable. No biliary ductal dilation. Pancreas: Slightly increased dilation of the main pancreatic duct now measuring 6 mm in the pancreatic head previously measuring 5 mm on CT Apr 30, 2021. No evidence of acute inflammation. Spleen: Within normal limits. Adrenals/Urinary Tract: Bilateral adrenal glands are unremarkable. Bilateral fluid density renal cysts are similar prior. No hydronephrosis. Urinary bladder is unremarkable for degree of distension. Stomach/Bowel: No enteric contrast was administered. Stomach is distended with ingested material without focal wall thickening. No pathologic dilation of small or large bowel. Lymphatic: No pathologically enlarged abdominal or pelvic lymph nodes. Stable calcifications in the right lower quadrant mesentery on image 232/7. Reproductive: Enlarged prostate gland. Other: No abdominopelvic free fluid.  Small left inguinal hernia. Musculoskeletal: Advanced/severe multilevel degenerative changes spine. Degenerative changes bilateral hips and SI joints. No acute osseous abnormality. Review of the MIP images confirms the above findings. IMPRESSION: 1. No evidence of thoracic or abdominal aortic aneurysm or dissection. No pulmonary embolus. 2. Tiny 2-3 mm or smaller right lower lobe pulmonary nodules some  of which are in a tree in bud distribution, likely infectious or inflammatory in etiology. 3. Irregular 8 mm in the paramedian right lower lobe with surrounding ground-glass opacities, which may be infectious or inflammatory in etiology. Non-contrast chest CT at 3-6 months is recommended. If nodules persist, subsequent management will be based upon the most suspicious nodule(s). This recommendation follows the consensus statement: Guidelines for Management of Incidental Pulmonary Nodules Detected on CT Images: From the Fleischner Society 2017; Radiology 2017; 284:228-243. 4. Slightly increased dilation of the main pancreatic duct now measuring 6 mm in the pancreatic head previously measuring 5 mm on CT Apr 30, 2021 common nonspecific and possibly reflecting senescent change. No evidence of acute inflammation or biliary ductal dilation. Consider short-term interval follow-up with pancreas protocol CT or MRI with and without contrast in 3-6 months. 5. Stable slight dilation of the left common iliac artery measuring 18 mm. 6. Enlarged prostate gland. 7.  Aortic Atherosclerosis (ICD10-I70.0). Electronically Signed   By: Maudry Mayhew M.D.   On: 11/26/2021 19:01    Procedures Procedures   CRITICAL CARE Performed by: Canary Brim Moet Mikulski Total critical care time: 35 minutes Critical care time was exclusive of separately billable procedures and treating other patients. Critical care was necessary to treat or prevent imminent or life-threatening deterioration. Critical care was time spent personally by me on the following activities: development of treatment plan with patient and/or surrogate as well as nursing, discussions with consultants, evaluation of patient's response to treatment, examination of patient, obtaining history from patient or surrogate, ordering and performing treatments and interventions, ordering and review of laboratory studies, ordering and review of radiographic studies, pulse oximetry and  re-evaluation of patient's condition.   Medications Ordered in ED Medications  lisinopril (ZESTRIL) tablet 10 mg (has no administration in time range)  pravastatin (PRAVACHOL) tablet 20 mg (has no administration in time range)  albuterol (PROVENTIL) (2.5 MG/3ML) 0.083% nebulizer solution 2.5 mg (has no administration in time range)  rivaroxaban (XARELTO) tablet 20 mg (has no administration in time range)  insulin aspart (novoLOG) injection 0-15 Units (has no administration in time range)  acetaminophen (TYLENOL) tablet 650 mg (has no administration in time range)    Or  acetaminophen (TYLENOL) suppository 650 mg (has no administration in time range)  polyethylene glycol (MIRALAX / GLYCOLAX) packet 17 g (has no administration in time range)  ondansetron (  ZOFRAN) tablet 4 mg (has no administration in time range)    Or  ondansetron (ZOFRAN) injection 4 mg (has no administration in time range)  fluticasone furoate-vilanterol (BREO ELLIPTA) 100-25 MCG/ACT 1 puff (has no administration in time range)    And  umeclidinium bromide (INCRUSE ELLIPTA) 62.5 MCG/ACT 1 puff (has no administration in time range)  morphine 4 MG/ML injection 4 mg (4 mg Intravenous Given 11/26/21 1620)  labetalol (NORMODYNE) injection 10 mg (10 mg Intravenous Given 11/26/21 1621)  ondansetron (ZOFRAN) injection 4 mg (4 mg Intravenous Given 11/26/21 1618)  iohexol (OMNIPAQUE) 350 MG/ML injection 100 mL (100 mLs Intravenous Contrast Given 11/26/21 1706)  ondansetron (ZOFRAN) injection 4 mg (4 mg Intravenous Given 11/26/21 2008)  hydrALAZINE (APRESOLINE) injection 10 mg (10 mg Intravenous Given 11/26/21 2011)  lactated ringers bolus 500 mL (0 mLs Intravenous Stopped 11/26/21 2330)  prochlorperazine (COMPAZINE) injection 5 mg (5 mg Intravenous Given 11/26/21 2106)  alum & mag hydroxide-simeth (MAALOX/MYLANTA) 200-200-20 MG/5ML suspension 30 mL (30 mLs Oral Given 11/26/21 2329)  pantoprazole (PROTONIX) injection 40 mg (40 mg  Intravenous Given 11/26/21 2329)  lactated ringers bolus 500 mL (500 mLs Intravenous New Bag/Given 11/26/21 2330)    ED Course  I have reviewed the triage vital signs and the nursing notes.  Pertinent labs & imaging results that were available during my care of the patient were reviewed by me and considered in my medical decision making (see chart for details).    MDM Rules/Calculators/A&P                           Coron Graser. is a 87 y.o. male with a past medical history significant for pulmonary embolism on Xarelto, previous iliac artery aneurysm, hypertension, diabetes, GERD, kidney stones, CKD, myasthenia gravis, CAD, and aortic atherosclerosis who presents with chest pain, epigastric pain, shortness of breath, and nausea.  Patient reports that since yesterday he has been having pain in his abdomen going into his chest and to his back.  He reports it is an aching pain and burning pain but he is concerned about it.  He denies any trauma.  Reports some nausea but no vomiting.  Denies any constipation, diarrhea, or urinary changes.  Denies fevers, chills, or URI symptoms.  He describes the pain as 5 out of 10 initially but has steadily increased now to about an 8 out of 10.  He reports his blood pressure has been elevated over the last week or 2 and his PCP had increased his lisinopril.  On arrival blood pressure is in the 190s and is now over 210 during my initial evaluation.  Patient is a 89 leg pain, leg swelling, or any neurologic complaints.  He is reporting the discomfort with the mild shortness of breath does feel somewhat similar when he has had PEs in the past but he is on Xarelto.  He does not describe any other complaints of myasthenia problems at this time.  On exam, lungs have wheezing and some crackles but otherwise did not appreciate rhonchi.  No murmur.  Abdomen was tender primarily epigastric area.  Normal bowel sounds.  Patient did have DP and PT pulses in both legs and had  intact sensation and can move his feet.  Good pulses in upper extremities well.  Patient resting with elevated blood pressure.  Heart rate was in the 80s when I saw him.  Had a shared decision-making conversation with family.  Clinically I do  feel that given his history of iliac artery aneurysm, blood pressure over 210, and this pain in his abdomen, chest, and back, we need to rule out dissection or aneurysm.  We will get CTA of the torso and abdomen and pelvis.  Imaging will also look for large pulmonary embolism.  Patient will have other screening labs including hepatic function and lipase and will be given pain medicine and labetalol.  If blood pressure is not significant proved, anticipate initiation of a drip.  We will get likely preadmission COVID/flu test.  Anticipate reassessment after work-up to determine disposition.  8:08 PM CT scans fortunately did not show evidence of pulmonary embolism or dissection but did show some inflammation in the lungs versus atelectasis and nodules.  On reassessment, patient's blood pressure has worsened again to over 210 and he is more symptomatic with more pain in his low back, abdominal discomfort, and is having nausea and vomiting.  Patient will be given more nausea medicine.  His blood pressure had improved briefly after the labetalol but after discussion with pharmacy we agreed to 2 instead of labetalol use hydralazine as this is less likely to precipitate myasthenia crisis.  Due to the patient's continued blood pressures being over 200, continued symptoms, and uncontrolled blood pressure, do not feel he is safe for discharge home.  Suspect a hypertensive urgency or emergency causing his symptoms today.  Will speak to medicine if they feel that antibiotics are needed given the CT findings but otherwise he is not having significant signs of infection.  Anticipate admission for further management.  Patient will be admitted for further management.  Final  Clinical Impression(s) / ED Diagnoses Final diagnoses:  Chest pain, unspecified type  Elevated blood pressure reading  Nausea and vomiting, unspecified vomiting type  Abdominal pain, unspecified abdominal location    Clinical Impression: 1. Chest pain, unspecified type   2. Elevated blood pressure reading   3. Nausea and vomiting, unspecified vomiting type   4. Abdominal pain, unspecified abdominal location     Disposition: Admit  This note was prepared with assistance of Dragon voice recognition software. Occasional wrong-word or sound-a-like substitutions may have occurred due to the inherent limitations of voice recognition software.     Sylvan Lahm, Gwenyth Allegra, MD 11/26/21 [phone]

## 2021-11-26 NOTE — ED Notes (Signed)
MD made aware of patients lactic level

## 2021-11-26 NOTE — ED Notes (Signed)
This RN called into room by patients visitor, patient reports feeling faint. Patient repositioned in bed, new BP obtained. Patient vomiting at this time. Provider made aware of low BP and patient condition. Orders placed. EKG obtained. CBG obtained.

## 2021-11-26 NOTE — ED Triage Notes (Signed)
Patient complains of ongoing left posterior shoulder pain with epigastric pain and heart burn with nausea x 1 day. Patient alert and oriented, reports uncomfortable feeling in chest

## 2021-11-26 NOTE — ED Provider Notes (Addendum)
Emergency Medicine Provider Triage Evaluation Note  [name]. , a 87 y.o. male  was evaluated in triage.  Pt complains of intermittent chest pain onset yesterday. He has associated nausea, and mild shortness of breath. He hasn't tried any medications. He denies shortness of breath, abdominal pain, nausea, vomiting, fever, chills. No history of MI or CVA. No stents placed. He is currently on xarelto.  He reports that he does a lot of yard work around the house, and that his back pain and is localized to his left shoulder musculature is not new for him but has been ongoing for several months.  Review of Systems  Positive: Chest pain Negative: Abdominal pain  Physical Exam  BP (!) 194/91 (BP Location: Left Arm)   Pulse 70   Temp 98.1 F (36.7 C) (Oral)   Resp 17   SpO2 98%  Gen:   Awake, no distress   Resp:  Normal effort  MSK:   Moves extremities without difficulty  Other:  No C, T, L, S spinal tenderness to palpation.  No chest wall tenderness to palpation.  Medical Decision Making  Medically screening exam initiated at 1:39 PM.  Appropriate orders placed.  Maura Crandall. was informed that the remainder of the evaluation will be completed by another provider, this initial triage assessment does not replace that evaluation, and the importance of remaining in the ED until their evaluation is complete.     Asante Ritacco A, PA-C 11/26/21 1346    Osiah Haring A, PA-C 11/26/21 1347    Gerhard Munch, MD 11/26/21 [place]andy

## 2021-11-26 NOTE — H&P (Signed)
History and Physical    [name]. [id number] DOB: May 06, 1932 DOA: 11/26/2021  PCP: Burnard Bunting, MD  Patient coming from: Home   Chief Complaint: epigastirc, chest, abdominal pain   HPI:    87 year old male with past medical history of coronary artery disease, hypertension, myasthenia gravis (ocular Sx), gastroesophageal reflux disease, hyperlipidemia, pulmonary embolism (treated with xarelto 04/2018), non-insulin-dependent diabetes mellitus type 2, benign prostatic hyperplasia who presents to [facility] emergency department with complaints of epigastric and left shoulder pain.  History is been obtained both from the patient as well as the wife who is at the bedside  Patient explains that for approximately past 3 days he has been experiencing abdominal pain, chest and back pain.  Patient describes this abdominal pain is epigastric in location, aching to burning in quality, radiating upwards into his chest and into his back.  Patient denies any alcohol or heavy NSAID use.  Patient denies any shortness of breath, fever, cough, sick contacts, recent travel, lower extremity edema, paroxysmal nocturnal dyspnea or pillow orthopnea.  Upon further questioning patient also reports recent development of markedly elevated blood pressures as of late which seems to have gradually increased over the past 1 week.  Patient states that he notified his primary care provider of this who recently increased his lisinopril regimen.  Due to patient's symptoms of epigastric chest and abdominal pain the patient eventually presented to [facility] emergency department for evaluation.  Upon evaluation in the emergency department upon arrival patient was found to have markedly elevated blood pressure as high as 212/102.  Patient was initially treated with an intravenous dose of labetalol and morphine however shortly thereafter the patient began to exhibit nausea and episodes of vomiting.   Patient was subsequently administered Zofran and hydralazine.  CT angiogram of the chest abdomen pelvis was performed revealing no evidence of dissection or pulmonary embolism however due to patient's persisting abdominal chest discomfort and hypertensive urgency the hospitalist group was then called to assess the patient for admission to the hospital.   Review of Systems:   Review of Systems  Gastrointestinal:  Positive for abdominal pain, constipation, nausea and vomiting.  All other systems reviewed and are negative.  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 He has quit using smokeless tobacco.  His smokeless tobacco use included chew. He reports that he does not drink alcohol and does not use drugs.  Allergies  Allergen Reactions   Aspirin     REACTION: full strength; causes gi irritation    Family History  Problem Relation Age of Onset   Heart attack Father    Sarcoidosis Brother      Prior to Admission medications   Medication  Sig Start Date End Date Taking? Authorizing Provider  albuterol (VENTOLIN HFA) 108 (90 Base) MCG/ACT inhaler Inhale 2 puffs into the lungs every 6 (six) hours as needed for wheezing. 03/04/20   [provider]  esomeprazole (NEXIUM 24HR) 20 MG capsule Take 20 mg by mouth as needed.    [provider]   lisinopril (PRINIVIL,ZESTRIL) 10 MG tablet Take 10 mg by mouth daily.    [provider]  meclizine (ANTIVERT) 50 MG tablet Take 1 tablet (50 mg total) by mouth 3 (three) times daily as needed. 05/22/20   Deno Etienne, DO  pravastatin (PRAVACHOL) 20 MG tablet Take 20 mg by mouth daily.    [provider]  TRELEGY ELLIPTA 100-62.5-25 MCG/INH AEPB Inhale 1 puff into the lungs daily. 04/12/21   [provider]  XARELTO 20 MG TABS tablet Take 20 mg by mouth daily. 05/29/21   [provider]    Physical Exam: Vitals:   11/26/21 2105 11/26/21 2115 11/26/21 2130 11/27/21 0030  BP: 121/63 138/65 (!) 150/62 118/72  Pulse: 61 78 76 80  Resp: 15 13 11 11   Temp:      TempSrc:      SpO2: 94% 96% 92% 92%    Constitutional: Awake alert and oriented x3, no associated distress.   Skin: no rashes, no lesions, good skin turgor noted. Eyes: Pupils are equally reactive to light.  No evidence of scleral icterus or conjunctival pallor.  ENMT: dry mucous membranes noted.  Posterior pharynx clear of any exudate or lesions.   Neck: normal, supple, no masses, no thyromegaly.  No evidence of jugular venous distension.   Respiratory: clear to auscultation bilaterally, no wheezing, no crackles. Normal respiratory effort. No accessory muscle use.  Cardiovascular: Regular rate and rhythm, no murmurs / rubs / gallops. No extremity edema. 2+ pedal pulses. No carotid bruits.  Chest:   Nontender without crepitus or deformity.   Back:   Nontender without crepitus or deformity. Abdomen: Mild epigastric tenderness.  Abdomen is soft.  No evidence of intra-abdominal masses.  Positive bowel sounds noted in all quadrants.   Musculoskeletal: No joint deformity upper and lower extremities. Good ROM, no contractures. Normal muscle tone.  Neurologic: CN 2-12 grossly intact. Sensation intact.  Patient moving all 4 extremities spontaneously.  Patient is following all commands.  Patient is responsive to  verbal stimuli.   Psychiatric: Patient exhibits normal mood with appropriate affect.  Patient seems to possess insight as to their current situation.     Labs on Admission: I have personally reviewed following labs and imaging studies -   CBC: Recent Labs  Lab 11/26/21 1328 11/27/21 0238  WBC 4.0 6.8  NEUTROABS  --  5.5  HGB 12.9* 12.1*  HCT 40.4 36.7*  MCV 92.4 90.6  PLT 262 0000000   Basic Metabolic Panel: Recent Labs  Lab 11/26/21 1328  NA 137  K 4.8  CL 99  CO2 30  GLUCOSE 109*  BUN 10  CREATININE 1.25*  CALCIUM 9.8   GFR: CrCl cannot be calculated (Unknown ideal weight.). Liver Function Tests: Recent Labs  Lab 11/26/21 1615  AST 18  ALT 13  ALKPHOS 90  BILITOT 0.7  PROT 7.0  ALBUMIN 3.7   Recent Labs  Lab 11/26/21 1615  LIPASE 32   No results for input(s): AMMONIA in the last 168 hours. Coagulation Profile: Recent Labs  Lab 11/26/21 1328  INR 1.2   Cardiac Enzymes: No results for input(s): CKTOTAL, CKMB, CKMBINDEX, TROPONINI in  the last 168 hours. BNP (last 3 results) No results for input(s): PROBNP in the last 8760 hours. HbA1C: No results for input(s): HGBA1C in the last 72 hours. CBG: Recent Labs  Lab 11/26/21 2100  GLUCAP 120*   Lipid Profile: No results for input(s): CHOL, HDL, LDLCALC, TRIG, CHOLHDL, LDLDIRECT in the last 72 hours. Thyroid Function Tests: No results for input(s): TSH, T4TOTAL, FREET4, T3FREE, THYROIDAB in the last 72 hours. Anemia Panel: No results for input(s): VITAMINB12, FOLATE, FERRITIN, TIBC, IRON, RETICCTPCT in the last 72 hours. Urine analysis:    Component Value Date/Time   COLORURINE YELLOW 10/02/2018 0140   APPEARANCEUR CLEAR 10/02/2018 0140   LABSPEC 1.019 10/02/2018 0140   PHURINE 6.0 10/02/2018 0140   GLUCOSEU NEGATIVE 10/02/2018 0140   HGBUR NEGATIVE 10/02/2018 0140   BILIRUBINUR NEGATIVE 10/02/2018 0140   KETONESUR NEGATIVE 10/02/2018 0140   PROTEINUR 100 (A) 10/02/2018 0140   UROBILINOGEN 1.0  04/08/2011 1839   NITRITE NEGATIVE 10/02/2018 0140   LEUKOCYTESUR NEGATIVE 10/02/2018 0140    Radiological Exams on Admission - Personally Reviewed: DG Chest 2 View  Result Date: 11/26/2021 CLINICAL DATA:  Chest pain. EXAM: CHEST - 2 VIEW COMPARISON:  Chest x-ray dated 10/01/2018. FINDINGS: Heart size and mediastinal contours are stable. Lungs are clear. No pleural effusion or pneumothorax is seen. Osseous structures about the chest are unremarkable. IMPRESSION: No active cardiopulmonary disease. No evidence of pneumonia or pulmonary edema. Electronically Signed   By: Franki Cabot M.D.   On: 11/26/2021 15:20   CT Angio Chest/Abd/Pel for Dissection W and/or Wo Contrast  Result Date: 11/26/2021 CLINICAL DATA:  Chest and abdominal pain radiating to the back, hypertension. EXAM: CT ANGIOGRAPHY CHEST, ABDOMEN AND PELVIS TECHNIQUE: Non-contrast CT of the chest was initially obtained. Multidetector CT imaging through the chest, abdomen and pelvis was performed using the standard protocol during bolus administration of intravenous contrast. Multiplanar reconstructed images and MIPs were obtained and reviewed to evaluate the vascular anatomy. CONTRAST:  128mL OMNIPAQUE IOHEXOL 350 MG/ML SOLN COMPARISON:  CT August 14, 2021 and October 02, 2018. FINDINGS: CTA CHEST FINDINGS Cardiovascular: Non contrasted examination demonstrates aortic and branch vessel atherosclerosis without intramural hematoma. No thoracic aortic aneurysm or dissection. No pulmonary embolus. Normal size heart. Three-vessel coronary artery calcifications. No significant pericardial effusion/thickening. Mediastinum/Nodes: No discrete thyroid nodule. No pathologically enlarged mediastinal, hilar or axillary lymph nodes. The trachea and esophagus are unremarkable. Lungs/Pleura: Mild diffuse bronchial wall thickening with areas of mucoid impaction. Tiny 2-3 mm or smaller right lower lobe pulmonary nodules some of which are in a tree in bud  distribution. Irregular 8 mm in the paramedian right lower lobe on image 84/8 with surrounding ground-glass opacities. No pleural effusion. No pneumothorax. Musculoskeletal: Multilevel degenerative changes spine. No aggressive lytic or blastic lesion of bone. Review of the MIP images confirms the above findings. CTA ABDOMEN AND PELVIS FINDINGS VASCULAR Aorta: Aortic atherosclerosis. Normal caliber aorta without aneurysm, dissection, vasculitis or significant stenosis. Celiac: Patent without evidence of aneurysm, dissection, vasculitis or significant stenosis. SMA: Patent without evidence of aneurysm, dissection, vasculitis or significant stenosis. Renals: Both renal arteries are patent without evidence of aneurysm, dissection, vasculitis, fibromuscular dysplasia or significant stenosis. IMA: Patent without evidence of aneurysm, dissection, vasculitis or significant stenosis. Inflow: Stable slight dilation of the left common iliac artery measuring 18 mm. Otherwise patent without evidence of dissection, vasculitis or significant stenosis. Veins: No obvious venous abnormality within the limitations of this arterial phase study. Review of the MIP images confirms the above  findings. NON-VASCULAR Hepatobiliary: No suspicious hepatic lesion. Gallbladder is unremarkable. No biliary ductal dilation. Pancreas: Slightly increased dilation of the main pancreatic duct now measuring 6 mm in the pancreatic head previously measuring 5 mm on CT Apr 30, 2021. No evidence of acute inflammation. Spleen: Within normal limits. Adrenals/Urinary Tract: Bilateral adrenal glands are unremarkable. Bilateral fluid density renal cysts are similar prior. No hydronephrosis. Urinary bladder is unremarkable for degree of distension. Stomach/Bowel: No enteric contrast was administered. Stomach is distended with ingested material without focal wall thickening. No pathologic dilation of small or large bowel. Lymphatic: No pathologically enlarged  abdominal or pelvic lymph nodes. Stable calcifications in the right lower quadrant mesentery on image 232/7. Reproductive: Enlarged prostate gland. Other: No abdominopelvic free fluid.  Small left inguinal hernia. Musculoskeletal: Advanced/severe multilevel degenerative changes spine. Degenerative changes bilateral hips and SI joints. No acute osseous abnormality. Review of the MIP images confirms the above findings. IMPRESSION: 1. No evidence of thoracic or abdominal aortic aneurysm or dissection. No pulmonary embolus. 2. Tiny 2-3 mm or smaller right lower lobe pulmonary nodules some of which are in a tree in bud distribution, likely infectious or inflammatory in etiology. 3. Irregular 8 mm in the paramedian right lower lobe with surrounding ground-glass opacities, which may be infectious or inflammatory in etiology. Non-contrast chest CT at 3-6 months is recommended. If nodules persist, subsequent management will be based upon the most suspicious nodule(s). This recommendation follows the consensus statement: Guidelines for Management of Incidental Pulmonary Nodules Detected on CT Images: From the Fleischner Society 2017; Radiology 2017; 284:228-243. 4. Slightly increased dilation of the main pancreatic duct now measuring 6 mm in the pancreatic head previously measuring 5 mm on CT Apr 30, 2021 common nonspecific and possibly reflecting senescent change. No evidence of acute inflammation or biliary ductal dilation. Consider short-term interval follow-up with pancreas protocol CT or MRI with and without contrast in 3-6 months. 5. Stable slight dilation of the left common iliac artery measuring 18 mm. 6. Enlarged prostate gland. 7.  Aortic Atherosclerosis (ICD10-I70.0). Electronically Signed   By: Maudry Mayhew M.D.   On: 11/26/2021 19:01    EKG: Personally reviewed.  Rhythm is normal sinus rhythm with heart rate of 63 bpm.  Left anterior fascicular block.   no dynamic ST segment changes  appreciated.  Assessment/Plan  * Hypertensive urgency Patient exhibiting markedly elevated blood pressures on arrival EKG shows no acute change, troponins unremarkable Patient reports compliance with his home regimen of antihypertensives Seems the patient's elevated blood pressures may be related to his episodes of epigastric discomfort Treating patient with as needed intravenous and hypertensives in addition to analgesics for epigastric pain Monitoring patient on telemetry Working up etiology of abdominal pain  Epigastric pain Accurate history of patient's epigastric pain is extremely difficult due to patient being a poor historian CT angiogram of the chest abdomen pelvis is negative for dissection LFTs and lipase unremarkable Due to patient's dramatic presentation will obtain right upper quadrant ultrasound despite reassuring LFTs Additionally initiating patient on regimen of PPIs Monitoring for symptomatic improvement with initiation of proton pump inhibitor, antiemetics and occasional analgesics If patient's discomfort improves and patient is able to tolerate oral intake then patient should follow-up as an outpatient with gastroenterology for possible EGD If symptoms are persisting in the morning we will consult gastroenterology hospitalized  Lactic acidosis Exhibiting substantial lactic acidosis, presumably secondary to volume depletion CT angiogram of abdomen revealing no obvious evidence of bowel ischemia Hydrating patient aggressively with intravenous isotonic  fluids Performing serial lactic acid levels to ensure downtrending and resolution  Constipation Patient additionally has been complaining of a 4 to 5-day history of ongoing constipation Will provide patient with an enema and reassess.  Myasthenia gravis ([place]) Based on my review of outpatient neurology notes, majority of patient's symptoms are ocular in origin with double vision No evidence of myasthenia flare at this  time  Type 2 diabetes mellitus without complication, without long-term current use of insulin ([place]) Patient been placed on Accu-Cheks before every meal and nightly with sliding scale insulin Hemoglobin A1C ordered   Mixed hyperlipidemia Continuing home regimen of lipid lowering therapy.   GERD without esophagitis Patient has a history of GERD patient has mainly been relying on taking as needed baking soda for his frequent symptoms Initiated PPI therapy as mentioned above  History of pulmonary embolism Patient continues to be on Xarelto daily, this will be continued.      Code Status:  Full code  code status decision has been confirmed with: patient Family Communication: Plan of care discussed with wife at the bedside  Status is: Observation  The patient remains OBS appropriate and will d/c before 2 midnights.       Vernelle Emerald MD [facility] Pager [phone]  If 7PM-7AM, please contact night-coverage [email or web address] Use universal [place] password for that web site. If you do not have the password, please call the hospital operator.  11/27/2021, 2:48 AM

## 2021-11-27 ENCOUNTER — Observation Stay (HOSPITAL_COMMUNITY): Payer: Medicare Other

## 2021-11-27 DIAGNOSIS — J3489 Other specified disorders of nose and nasal sinuses: Secondary | ICD-10-CM | POA: Diagnosis not present

## 2021-11-27 DIAGNOSIS — I10 Essential (primary) hypertension: Secondary | ICD-10-CM | POA: Diagnosis present

## 2021-11-27 DIAGNOSIS — E872 Acidosis, unspecified: Secondary | ICD-10-CM | POA: Diagnosis present

## 2021-11-27 DIAGNOSIS — R072 Precordial pain: Secondary | ICD-10-CM | POA: Diagnosis not present

## 2021-11-27 DIAGNOSIS — R519 Headache, unspecified: Secondary | ICD-10-CM | POA: Diagnosis not present

## 2021-11-27 DIAGNOSIS — R109 Unspecified abdominal pain: Secondary | ICD-10-CM | POA: Diagnosis not present

## 2021-11-27 DIAGNOSIS — K5900 Constipation, unspecified: Secondary | ICD-10-CM | POA: Diagnosis present

## 2021-11-27 DIAGNOSIS — I160 Hypertensive urgency: Secondary | ICD-10-CM | POA: Diagnosis not present

## 2021-11-27 LAB — URINALYSIS, COMPLETE (UACMP) WITH MICROSCOPIC
Bilirubin Urine: NEGATIVE
Glucose, UA: NEGATIVE mg/dL
Hgb urine dipstick: NEGATIVE
Ketones, ur: 5 mg/dL — AB
Leukocytes,Ua: NEGATIVE
Nitrite: NEGATIVE
Protein, ur: 100 mg/dL — AB
Specific Gravity, Urine: 1.031 — ABNORMAL HIGH (ref 1.005–1.030)
pH: 8 (ref 5.0–8.0)

## 2021-11-27 LAB — COMPREHENSIVE METABOLIC PANEL
ALT: 12 U/L (ref 0–44)
AST: 17 U/L (ref 15–41)
Albumin: 3.3 g/dL — ABNORMAL LOW (ref 3.5–5.0)
Alkaline Phosphatase: 89 U/L (ref 38–126)
Anion gap: 9 (ref 5–15)
BUN: 12 mg/dL (ref 8–23)
CO2: 28 mmol/L (ref 22–32)
Calcium: 9.2 mg/dL (ref 8.9–10.3)
Chloride: 98 mmol/L (ref 98–111)
Creatinine, Ser: 1.4 mg/dL — ABNORMAL HIGH (ref 0.61–1.24)
GFR, Estimated: 49 mL/min — ABNORMAL LOW (ref >60)
Glucose, Bld: 105 mg/dL — ABNORMAL HIGH (ref 70–99)
Potassium: 4.8 mmol/L (ref 3.5–5.1)
Sodium: 135 mmol/L (ref 135–145)
Total Bilirubin: 1 mg/dL (ref 0.3–1.2)
Total Protein: 6.1 g/dL — ABNORMAL LOW (ref 6.5–8.1)

## 2021-11-27 LAB — CBC WITH DIFFERENTIAL/PLATELET
Abs Immature Granulocytes: 0.03 10*3/uL (ref 0.00–0.07)
Basophils Absolute: 0 10*3/uL (ref 0.0–0.1)
Basophils Relative: 0 %
Eosinophils Absolute: 0.1 10*3/uL (ref 0.0–0.5)
Eosinophils Relative: 2 %
HCT: 36.7 % — ABNORMAL LOW (ref 39.0–52.0)
Hemoglobin: 12.1 g/dL — ABNORMAL LOW (ref 13.0–17.0)
Immature Granulocytes: 0 %
Lymphocytes Relative: 10 %
Lymphs Abs: 0.7 10*3/uL (ref 0.7–4.0)
MCH: 29.9 pg (ref 26.0–34.0)
MCHC: 33 g/dL (ref 30.0–36.0)
MCV: 90.6 fL (ref 80.0–100.0)
Monocytes Absolute: 0.4 10*3/uL (ref 0.1–1.0)
Monocytes Relative: 6 %
Neutro Abs: 5.5 10*3/uL (ref 1.7–7.7)
Neutrophils Relative %: 82 %
Platelets: 233 10*3/uL (ref 150–400)
RBC: 4.05 MIL/uL — ABNORMAL LOW (ref 4.22–5.81)
RDW: 15.3 % (ref 11.5–15.5)
WBC: 6.8 10*3/uL (ref 4.0–10.5)
nRBC: 0 % (ref 0.0–0.2)

## 2021-11-27 LAB — GLUCOSE, CAPILLARY
Glucose-Capillary: 86 mg/dL (ref 70–99)
Glucose-Capillary: 146 mg/dL — ABNORMAL HIGH (ref 70–99)

## 2021-11-27 LAB — HEMOGLOBIN A1C
Hgb A1c MFr Bld: 5.7 % — ABNORMAL HIGH (ref 4.8–5.6)
Mean Plasma Glucose: 116.89 mg/dL

## 2021-11-27 LAB — MAGNESIUM: Magnesium: 1.7 mg/dL (ref 1.7–2.4)

## 2021-11-27 LAB — CBG MONITORING, ED
Glucose-Capillary: 76 mg/dL (ref 70–99)
Glucose-Capillary: 98 mg/dL (ref 70–99)

## 2021-11-27 LAB — TROPONIN I (HIGH SENSITIVITY): Troponin I (High Sensitivity): 21 ng/L — ABNORMAL HIGH (ref <18)

## 2021-11-27 LAB — LACTIC ACID, PLASMA: Lactic Acid, Venous: 1.3 mmol/L (ref 0.5–1.9)

## 2021-11-27 MED ORDER — ALBUTEROL SULFATE (2.5 MG/3ML) 0.083% IN NEBU
2.5000 mg | INHALATION_SOLUTION | Freq: Four times a day (QID) | RESPIRATORY_TRACT | Status: DC
Start: 2021-11-27 — End: 2021-11-27
  Administered 2021-11-27 (×2): 2.5 mg via RESPIRATORY_TRACT
  Filled 2021-11-27 (×4): qty 3

## 2021-11-27 MED ORDER — POLYETHYLENE GLYCOL 3350 17 G PO PACK
17.0000 g | PACK | Freq: Two times a day (BID) | ORAL | Status: DC
  Administered 2021-11-27: 17 g via ORAL
  Filled 2021-11-27: qty 1

## 2021-11-27 MED ORDER — LACTATED RINGERS IV SOLN: INTRAVENOUS | Status: DC

## 2021-11-27 MED ORDER — PANTOPRAZOLE SODIUM 40 MG PO TBEC
40.0000 mg | DELAYED_RELEASE_TABLET | Freq: Two times a day (BID) | ORAL | Status: DC
  Administered 2021-11-27 – 2021-11-28 (×3): 40 mg via ORAL
  Filled 2021-11-27 (×3): qty 1

## 2021-11-27 MED ORDER — AMLODIPINE BESYLATE 5 MG PO TABS
5.0000 mg | ORAL_TABLET | Freq: Once | ORAL | Status: AC
  Administered 2021-11-27: 5 mg via ORAL
  Filled 2021-11-27: qty 1

## 2021-11-27 MED ORDER — PANTOPRAZOLE SODIUM 40 MG PO TBEC: 40.0000 mg | DELAYED_RELEASE_TABLET | Freq: Every day | ORAL | Status: DC

## 2021-11-27 MED ORDER — FLEET ENEMA 7-19 GM/118ML RE ENEM
1.0000 | ENEMA | Freq: Once | RECTAL | Status: AC
  Administered 2021-11-27: 1 via RECTAL
  Filled 2021-11-27: qty 1

## 2021-11-27 MED ORDER — BACLOFEN 10 MG PO TABS
5.0000 mg | ORAL_TABLET | Freq: Once | ORAL | Status: AC
  Administered 2021-11-27: 5 mg via ORAL
  Filled 2021-11-27: qty 0.5

## 2021-11-27 MED ORDER — ALBUTEROL SULFATE (2.5 MG/3ML) 0.083% IN NEBU: 2.5000 mg | INHALATION_SOLUTION | Freq: Four times a day (QID) | RESPIRATORY_TRACT | Status: DC | PRN

## 2021-11-27 MED ORDER — AMLODIPINE BESYLATE 5 MG PO TABS
5.0000 mg | ORAL_TABLET | Freq: Every day | ORAL | Status: DC
  Administered 2021-11-27: 5 mg via ORAL
  Filled 2021-11-27: qty 1

## 2021-11-27 MED ORDER — SENNA 8.6 MG PO TABS
1.0000 | ORAL_TABLET | Freq: Every day | ORAL | Status: DC
  Administered 2021-11-27: 8.6 mg via ORAL
  Filled 2021-11-27: qty 1

## 2021-11-27 MED ORDER — AMLODIPINE BESYLATE 10 MG PO TABS
10.0000 mg | ORAL_TABLET | Freq: Every day | ORAL | Status: DC
  Administered 2021-11-28: 10 mg via ORAL
  Filled 2021-11-27: qty 1

## 2021-11-27 NOTE — Assessment & Plan Note (Signed)
   Exhibiting substantial lactic acidosis, presumably secondary to volume depletion  CT angiogram of abdomen revealing no obvious evidence of bowel ischemia  Hydrating patient aggressively with intravenous isotonic fluids  Performing serial lactic acid levels to ensure downtrending and resolution

## 2021-11-27 NOTE — Assessment & Plan Note (Addendum)
   Accurate history of patient's epigastric pain is extremely difficult due to patient being a poor historian  CT angiogram of the chest abdomen pelvis is negative for dissection  LFTs and lipase unremarkable  Due to patient's dramatic presentation will obtain right upper quadrant ultrasound despite reassuring LFTs  Obtaining urinalysis  Additionally initiating patient on regimen of PPIs  Monitoring for symptomatic improvement with initiation of proton pump inhibitor, antiemetics and occasional analgesics  If patient's discomfort improves and patient is able to tolerate oral intake then patient should follow-up as an outpatient with gastroenterology for possible EGD  If symptoms are persisting in the morning we will consult gastroenterology hospitalized

## 2021-11-27 NOTE — Assessment & Plan Note (Signed)
   Patient additionally has been complaining of a 4 to 5-day history of ongoing constipation  Will provide patient with an enema and reassess.

## 2021-11-27 NOTE — ED Notes (Signed)
Patient has small bowel movment

## 2021-11-27 NOTE — Assessment & Plan Note (Signed)
   Patient exhibiting markedly elevated blood pressures on arrival  EKG shows no acute change, troponins unremarkable  Patient reports compliance with his home regimen of antihypertensives  Seems the patient's elevated blood pressures may be related to his episodes of epigastric discomfort  Treating patient with as needed intravenous and hypertensives in addition to analgesics for epigastric pain  Monitoring patient on telemetry  Working up etiology of abdominal pain

## 2021-11-27 NOTE — Assessment & Plan Note (Signed)
   Patient has a history of GERD patient has mainly been relying on taking as needed baking soda for his frequent symptoms  Initiated PPI therapy as mentioned above

## 2021-11-27 NOTE — Assessment & Plan Note (Signed)
.   Patient been placed on Accu-Cheks before every meal and nightly with sliding scale insulin . Hemoglobin A1C ordered   

## 2021-11-27 NOTE — Assessment & Plan Note (Signed)
   Based on my review of outpatient neurology notes, majority of patient's symptoms are ocular in origin with double vision  No evidence of myasthenia flare at this time

## 2021-11-27 NOTE — Assessment & Plan Note (Signed)
   Patient continues to be on Xarelto daily, this will be continued.

## 2021-11-27 NOTE — Assessment & Plan Note (Signed)
.   Continuing home regimen of lipid lowering therapy.  

## 2021-11-27 NOTE — Progress Notes (Addendum)
PROGRESS NOTE    [name].  [id number] DOB: 10/25/1932 DOA: 11/26/2021 PCP: Burnard Bunting, MD   Brief Narrative: 87 year old with past medical history significant for CAD, hypertension, myasthenia gravis (ocular symptoms), gastroesophageal reflux, PE treated with Xarelto 2019, diabetes type 2, presented to [facility] complaining of epigastric and left shoulder pain, chest pain.  Patient was found to have severe hypertension systolic blood pressure [id number]. CT angio chest abdomen pelvis was negative for dissection or pulmonary embolism.     Assessment & Plan:   Principal Problem:   Hypertensive urgency Active Problems:   Myasthenia gravis ([place])   GERD without esophagitis   Mixed hyperlipidemia   Type 2 diabetes mellitus without complication, without long-term current use of insulin (HCC)   Epigastric pain   History of pulmonary embolism   Lactic acidosis   Constipation  1-Hypertensive urgency: Hold Lisinopril due to rise in cr.  Start Norvasc.    2-Epigastric pain: CT angio chest abdomen pelvis negative for dissection. Liver function test and lipase unremarkable. Right upper quadrant ultrasound: Trial of PPI. Report improvement of abdominal pain.  Plan to advance diet as tolerated  Lactic acidosis: Could be related to volume depletion. CT angio abdomen pelvis unrevealing Improved with hydration  Constipation: Bowel regimen  Myasthenia gravis: Monitor.  Diabetes type 2 with long-term insulin use: Continue with SSI  Hyperlipidemia: On Pravachol.   GERD: Resume PPI  History of PE: Continue with Xarelto  Bronchitis; start nebulizer.  CKS stage IIIa;  Prior 1.2--1.4 Monitor   Estimated body mass index is 25.17 kg/m as calculated from the following:   Height as of 07/19/21: 5' 7.5" (1.715 m).   Weight as of 07/19/21: 74 kg.   DVT prophylaxis: Xarelto Code Status: Full code Family Communication: wife at bedsdie Disposition Plan:  Status is:  Observation  The patient remains OBS appropriate and will d/c before 2 midnights.       Consultants:  none  Procedures:    Antimicrobials:    Subjective: He is alert, denies abdominal pain   Objective: Vitals:   11/27/21 0030 11/27/21 0430 11/27/21 0434 11/27/21 0715  BP: 118/72 134/75  (!) 151/92  Pulse: 80 71  60  Resp: 11 13  14   Temp:   99.5 F (37.5 C)   TempSrc:   Rectal   SpO2: 92% 95%  92%   No intake or output data in the 24 hours ending 11/27/21 0803 There were no vitals filed for this visit.  Examination:  General exam: Appears calm and comfortable  Respiratory system: BL ronchus Cardiovascular system: S1 & S2 heard, RRR. No JVD, murmurs, rubs, gallops or clicks. No pedal edema. Gastrointestinal system: Abdomen is nondistended, soft and nontender. No organomegaly or masses felt. Normal bowel sounds heard.   Data Reviewed: I have personally reviewed following labs and imaging studies  CBC: Recent Labs  Lab 11/26/21 1328 11/27/21 0238  WBC 4.0 6.8  NEUTROABS  --  5.5  HGB 12.9* 12.1*  HCT 40.4 36.7*  MCV 92.4 90.6  PLT 262 0000000   Basic Metabolic Panel: Recent Labs  Lab 11/26/21 1328 11/27/21 0238  NA 137 135  K 4.8 4.8  CL 99 98  CO2 30 28  GLUCOSE 109* 105*  BUN 10 12  CREATININE 1.25* 1.40*  CALCIUM 9.8 9.2  MG  --  1.7   GFR: CrCl cannot be calculated (Unknown ideal weight.). Liver Function Tests: Recent Labs  Lab 11/26/21 1615 11/27/21 0238  AST 18 17  ALT 13 12  ALKPHOS 90 89  BILITOT 0.7 1.0  PROT 7.0 6.1*  ALBUMIN 3.7 3.3*   Recent Labs  Lab 11/26/21 1615  LIPASE 32   No results for input(s): AMMONIA in the last 168 hours. Coagulation Profile: Recent Labs  Lab 11/26/21 1328  INR 1.2   Cardiac Enzymes: No results for input(s): CKTOTAL, CKMB, CKMBINDEX, TROPONINI in the last 168 hours. BNP (last 3 results) No results for input(s): PROBNP in the last 8760 hours. HbA1C: Recent Labs    11/27/21 0238   HGBA1C 5.7*   CBG: Recent Labs  Lab 11/26/21 2100 11/27/21 0802  GLUCAP 120* 76   Lipid Profile: No results for input(s): CHOL, HDL, LDLCALC, TRIG, CHOLHDL, LDLDIRECT in the last 72 hours. Thyroid Function Tests: No results for input(s): TSH, T4TOTAL, FREET4, T3FREE, THYROIDAB in the last 72 hours. Anemia Panel: No results for input(s): VITAMINB12, FOLATE, FERRITIN, TIBC, IRON, RETICCTPCT in the last 72 hours. Sepsis Labs: Recent Labs  Lab 11/26/21 2204 11/27/21 0130  PROCALCITON <0.10  --   LATICACIDVEN 3.5* 1.3    Recent Results (from the past 240 hour(s))  Resp Panel by RT-PCR (Flu A&B, Covid) Nasopharyngeal Swab     Status: None   Collection Time: 11/26/21  4:05 PM   Specimen: Nasopharyngeal Swab; Nasopharyngeal(NP) swabs in vial transport medium  Result Value Ref Range Status   SARS Coronavirus 2 by RT PCR NEGATIVE NEGATIVE Final    Comment: (NOTE) SARS-CoV-2 target nucleic acids are NOT DETECTED.  The SARS-CoV-2 RNA is generally detectable in upper respiratory specimens during the acute phase of infection. The lowest concentration of SARS-CoV-2 viral copies this assay can detect is 138 copies/mL. A negative result does not preclude SARS-Cov-2 infection and should not be used as the sole basis for treatment or other patient management decisions. A negative result may occur with  improper specimen collection/handling, submission of specimen other than nasopharyngeal swab, presence of viral mutation(s) within the areas targeted by this assay, and inadequate number of viral copies(<138 copies/mL). A negative result must be combined with clinical observations, patient history, and epidemiological information. The expected result is Negative.  Fact Sheet for Patients:  BloggerCourse.com  Fact Sheet for Healthcare Providers:  SeriousBroker.it  This test is no t yet approved or cleared by the [place] FDA and   has been authorized for detection and/or diagnosis of SARS-CoV-2 by FDA under an Emergency Use Authorization (EUA). This EUA will remain  in effect (meaning this test can be used) for the duration of the COVID-19 declaration under Section 564(b)(1) of the Act, 21 U.S.C.section 360bbb-3(b)(1), unless the authorization is terminated  or revoked sooner.       Influenza A by PCR NEGATIVE NEGATIVE Final   Influenza B by PCR NEGATIVE NEGATIVE Final    Comment: (NOTE) The Xpert Xpress SARS-CoV-2/FLU/RSV plus assay is intended as an aid in the diagnosis of influenza from Nasopharyngeal swab specimens and should not be used as a sole basis for treatment. Nasal washings and aspirates are unacceptable for Xpert Xpress SARS-CoV-2/FLU/RSV testing.  Fact Sheet for Patients: BloggerCourse.com  Fact Sheet for Healthcare Providers: SeriousBroker.it  This test is not yet approved or cleared by the [place] FDA and has been authorized for detection and/or diagnosis of SARS-CoV-2 by FDA under an Emergency Use Authorization (EUA). This EUA will remain in effect (meaning this test can be used) for the duration of the COVID-19 declaration under Section 564(b)(1) of the Act, 21 U.S.C. section 360bbb-3(b)(1), unless the  authorization is terminated or revoked.  Performed at [facility], [place] [address], [place], [place] [address]          Radiology Studies: DG Chest 2 View  Result Date: 11/26/2021 CLINICAL DATA:  Chest pain. EXAM: CHEST - 2 VIEW COMPARISON:  Chest x-ray dated 10/01/2018. FINDINGS: Heart size and mediastinal contours are stable. Lungs are clear. No pleural effusion or pneumothorax is seen. Osseous structures about the chest are unremarkable. IMPRESSION: No active cardiopulmonary disease. No evidence of pneumonia or pulmonary edema. Electronically Signed   By: Franki Cabot M.D.   On: 11/26/2021 15:20   CT Angio  Chest/Abd/Pel for Dissection W and/or Wo Contrast  Result Date: 11/26/2021 CLINICAL DATA:  Chest and abdominal pain radiating to the back, hypertension. EXAM: CT ANGIOGRAPHY CHEST, ABDOMEN AND PELVIS TECHNIQUE: Non-contrast CT of the chest was initially obtained. Multidetector CT imaging through the chest, abdomen and pelvis was performed using the standard protocol during bolus administration of intravenous contrast. Multiplanar reconstructed images and MIPs were obtained and reviewed to evaluate the vascular anatomy. CONTRAST:  184mL OMNIPAQUE IOHEXOL 350 MG/ML SOLN COMPARISON:  CT August 14, 2021 and October 02, 2018. FINDINGS: CTA CHEST FINDINGS Cardiovascular: Non contrasted examination demonstrates aortic and branch vessel atherosclerosis without intramural hematoma. No thoracic aortic aneurysm or dissection. No pulmonary embolus. Normal size heart. Three-vessel coronary artery calcifications. No significant pericardial effusion/thickening. Mediastinum/Nodes: No discrete thyroid nodule. No pathologically enlarged mediastinal, hilar or axillary lymph nodes. The trachea and esophagus are unremarkable. Lungs/Pleura: Mild diffuse bronchial wall thickening with areas of mucoid impaction. Tiny 2-3 mm or smaller right lower lobe pulmonary nodules some of which are in a tree in bud distribution. Irregular 8 mm in the paramedian right lower lobe on image 84/8 with surrounding ground-glass opacities. No pleural effusion. No pneumothorax. Musculoskeletal: Multilevel degenerative changes spine. No aggressive lytic or blastic lesion of bone. Review of the MIP images confirms the above findings. CTA ABDOMEN AND PELVIS FINDINGS VASCULAR Aorta: Aortic atherosclerosis. Normal caliber aorta without aneurysm, dissection, vasculitis or significant stenosis. Celiac: Patent without evidence of aneurysm, dissection, vasculitis or significant stenosis. SMA: Patent without evidence of aneurysm, dissection, vasculitis or significant  stenosis. Renals: Both renal arteries are patent without evidence of aneurysm, dissection, vasculitis, fibromuscular dysplasia or significant stenosis. IMA: Patent without evidence of aneurysm, dissection, vasculitis or significant stenosis. Inflow: Stable slight dilation of the left common iliac artery measuring 18 mm. Otherwise patent without evidence of dissection, vasculitis or significant stenosis. Veins: No obvious venous abnormality within the limitations of this arterial phase study. Review of the MIP images confirms the above findings. NON-VASCULAR Hepatobiliary: No suspicious hepatic lesion. Gallbladder is unremarkable. No biliary ductal dilation. Pancreas: Slightly increased dilation of the main pancreatic duct now measuring 6 mm in the pancreatic head previously measuring 5 mm on CT Apr 30, 2021. No evidence of acute inflammation. Spleen: Within normal limits. Adrenals/Urinary Tract: Bilateral adrenal glands are unremarkable. Bilateral fluid density renal cysts are similar prior. No hydronephrosis. Urinary bladder is unremarkable for degree of distension. Stomach/Bowel: No enteric contrast was administered. Stomach is distended with ingested material without focal wall thickening. No pathologic dilation of small or large bowel. Lymphatic: No pathologically enlarged abdominal or pelvic lymph nodes. Stable calcifications in the right lower quadrant mesentery on image 232/7. Reproductive: Enlarged prostate gland. Other: No abdominopelvic free fluid.  Small left inguinal hernia. Musculoskeletal: Advanced/severe multilevel degenerative changes spine. Degenerative changes bilateral hips and SI joints. No acute osseous abnormality. Review of the MIP  images confirms the above findings. IMPRESSION: 1. No evidence of thoracic or abdominal aortic aneurysm or dissection. No pulmonary embolus. 2. Tiny 2-3 mm or smaller right lower lobe pulmonary nodules some of which are in a tree in bud distribution, likely  infectious or inflammatory in etiology. 3. Irregular 8 mm in the paramedian right lower lobe with surrounding ground-glass opacities, which may be infectious or inflammatory in etiology. Non-contrast chest CT at 3-6 months is recommended. If nodules persist, subsequent management will be based upon the most suspicious nodule(s). This recommendation follows the consensus statement: Guidelines for Management of Incidental Pulmonary Nodules Detected on CT Images: From the Fleischner Society 2017; Radiology 2017; 284:228-243. 4. Slightly increased dilation of the main pancreatic duct now measuring 6 mm in the pancreatic head previously measuring 5 mm on CT Apr 30, 2021 common nonspecific and possibly reflecting senescent change. No evidence of acute inflammation or biliary ductal dilation. Consider short-term interval follow-up with pancreas protocol CT or MRI with and without contrast in 3-6 months. 5. Stable slight dilation of the left common iliac artery measuring 18 mm. 6. Enlarged prostate gland. 7.  Aortic Atherosclerosis (ICD10-I70.0). Electronically Signed   By: Dahlia Bailiff M.D.   On: 11/26/2021 19:01   US Abdomen Limited RUQ (LIVER/GB)  Result Date: 11/27/2021 CLINICAL DATA:  Abdominal pain EXAM: ULTRASOUND ABDOMEN LIMITED RIGHT UPPER QUADRANT COMPARISON:  11/26/2021 FINDINGS: Gallbladder: No gallstones or wall thickening visualized. No sonographic Murphy sign noted by sonographer. Common bile duct: Diameter: 4.6 mm. Liver: No focal lesion identified. Within normal limits in parenchymal echogenicity. Portal vein is patent on color Doppler imaging with normal direction of blood flow towards the liver. Other: Septated upper pole right renal cyst is noted measuring up to 3.8 cm. IMPRESSION: No acute right upper quadrant abnormality is noted. Septated right renal cyst is seen similar to that noted on prior CT. Electronically Signed   By: Inez Catalina M.D.   On: 11/27/2021 03:05        Scheduled Meds:   fluticasone furoate-vilanterol  1 puff Inhalation Daily   And   umeclidinium bromide  1 puff Inhalation Daily   insulin aspart  0-15 Units Subcutaneous TID AC & HS   lisinopril  10 mg Oral Daily   pantoprazole  40 mg Oral BID AC   pravastatin  20 mg Oral Daily   rivaroxaban  20 mg Oral QAC supper   Continuous Infusions:  lactated ringers 125 mL/hr at 11/27/21 0432     LOS: 0 days    Time spent: 28 minutes/     Barbaraann Avans A Zanaya Baize, MD [facility]   If 7PM-7AM, please contact night-coverage [email or web address]  11/27/2021, 8:03 AM

## 2021-11-28 DIAGNOSIS — I160 Hypertensive urgency: Secondary | ICD-10-CM | POA: Diagnosis not present

## 2021-11-28 DIAGNOSIS — R072 Precordial pain: Secondary | ICD-10-CM | POA: Diagnosis not present

## 2021-11-28 LAB — GLUCOSE, CAPILLARY: Glucose-Capillary: 81 mg/dL (ref 70–99)

## 2021-11-28 LAB — BASIC METABOLIC PANEL
Anion gap: 7 (ref 5–15)
BUN: 12 mg/dL (ref 8–23)
CO2: 28 mmol/L (ref 22–32)
Calcium: 8.8 mg/dL — ABNORMAL LOW (ref 8.9–10.3)
Chloride: 99 mmol/L (ref 98–111)
Creatinine, Ser: 1.41 mg/dL — ABNORMAL HIGH (ref 0.61–1.24)
GFR, Estimated: 48 mL/min — ABNORMAL LOW (ref >60)
Glucose, Bld: 120 mg/dL — ABNORMAL HIGH (ref 70–99)
Potassium: 4.1 mmol/L (ref 3.5–5.1)
Sodium: 134 mmol/L — ABNORMAL LOW (ref 135–145)

## 2021-11-28 MED ORDER — PANTOPRAZOLE SODIUM 40 MG PO TBEC: 40.0000 mg | DELAYED_RELEASE_TABLET | Freq: Two times a day (BID) | ORAL | 0 refills | Status: AC

## 2021-11-28 MED ORDER — POLYETHYLENE GLYCOL 3350 17 G PO PACK: 17.0000 g | PACK | Freq: Two times a day (BID) | ORAL | 0 refills | Status: AC

## 2021-11-28 MED ORDER — AMLODIPINE BESYLATE 10 MG PO TABS
10.0000 mg | ORAL_TABLET | Freq: Every day | ORAL | 3 refills | Status: AC
Start: 2021-11-29 — End: ?

## 2021-11-28 NOTE — Evaluation (Signed)
Physical Therapy Evaluation and Discharge Patient Details Name: [name]. MRN: [id number] DOB: 24-Feb-1932 Today's Date: 11/28/2021  History of Present Illness  Pt is an 87 y.o. male admited on 11/26/21 with hypertensive urgency, chest pain, and abdominal pain. EKG and CT scan negative. PMH includes PE, myasthenia gravis (ocular), DM type II, CKD stage II, aortic artherosclerosis, HTN, HLD, osteoarthritis, iliac artery aneurysm.   Clinical Impression  PTA, pt working and ambulated without the use of DME. Today, pt ambulated in hall with modified independence for increased time and no DME. Due to pt being at baseline level of functioning, will d/c PT at this time. Pt and wife had no further questions or concerns.  Pre-mobility BP: 127/81 Post-mobility BP:121/80.    Recommendations for follow up therapy are one component of a multi-disciplinary discharge planning process, led by the attending physician.  Recommendations may be updated based on patient status, additional functional criteria and insurance authorization.  Follow Up Recommendations No PT follow up    Assistance Recommended at Discharge PRN  Functional Status Assessment    Equipment Recommendations  None recommended by PT    Recommendations for Other Services       Precautions / Restrictions Precautions Precautions: Fall Precaution Comments: monitor BP Restrictions Weight Bearing Restrictions: No      Mobility  Bed Mobility               General bed mobility comments: pt received in recliner    Transfers Overall transfer level: Modified independent Equipment used: None Transfers: Sit to/from Stand Sit to Stand: Modified independent (Device/Increase time)           General transfer comment: stood from relciner with use of UE and increased time    Ambulation/Gait Ambulation/Gait assistance: Modified independent (Device/Increase time) Gait Distance (Feet): 502 Feet Assistive device: None Gait  Pattern/deviations: Step-through pattern Gait velocity: dereased     General Gait Details: pt c/o cramping in right ankle during ambulation  Stairs Stairs: Yes Stairs assistance: Supervision Stair Management: Alternating pattern;One rail Left;One rail Right Number of Stairs: 10 General stair comments: navigated stairs with increased time and use of handrail; supervision for safety  Wheelchair Mobility    Modified Rankin (Stroke Patients Only)       Balance Overall balance assessment: Needs assistance   Sitting balance-Leahy Scale: Good     Standing balance support: No upper extremity supported Standing balance-Leahy Scale: Good Standing balance comment: Pt ambulates with no DME               High Level Balance Comments: Pt able to lean down outside of BOS to point to area of pain without LOB. Experienced 1 LOB while walking and looking down requring min A to correct Standardized Balance Assessment Standardized Balance Assessment : Dynamic Gait Index   Dynamic Gait Index Level Surface: Normal Change in Gait Speed: Normal Gait with Horizontal Head Turns: Mild Impairment Gait with Vertical Head Turns: Moderate Impairment Gait and Pivot Turn: Normal Step Over Obstacle: Mild Impairment Step Around Obstacles: Mild Impairment Steps: Mild Impairment Total Score: 18       Pertinent Vitals/Pain Pain Assessment: Faces Faces Pain Scale: Hurts a little bit Pain Location: R ankle Pain Descriptors / Indicators: Cramping Pain Intervention(s): Limited activity within patient's tolerance;Monitored during session;Other (comment) (taught pt a calf stretch)    Home Living Family/patient expects to be discharged to:: Private residence Living Arrangements: Spouse/significant other Available Help at Discharge: Family Type of Home: House Home Access: Level  entry       Home Layout: One level Home Equipment: Rollator (4 wheels);BSC/3in1;Shower seat - built in;Grab bars -  tub/shower      Prior Function Prior Level of Function : Working/employed             Mobility Comments: ambulated without DME       Hand Dominance        Extremity/Trunk Assessment   Upper Extremity Assessment Upper Extremity Assessment: Overall WFL for tasks assessed    Lower Extremity Assessment Lower Extremity Assessment: Generalized weakness       Communication   Communication: No difficulties  Cognition Arousal/Alertness: Awake/alert Behavior During Therapy: WFL for tasks assessed/performed Overall Cognitive Status: Within Functional Limits for tasks assessed                                 General Comments: Pt alert and following all commands        General Comments General comments (skin integrity, edema, etc.): Pre-therapy BP: 127/81, Post-therapy BP: 121/80. Taught pt a calf stretch for cramping in R ankle, pt reported it helped. Wife present during session and supportive.    Exercises     Assessment/Plan    PT Assessment Patient does not need any further PT services  PT Problem List         PT Treatment Interventions      PT Goals (Current goals can be found in the Care Plan section)  Acute Rehab PT Goals Patient Stated Goal: to return home PT Goal Formulation: With patient/family Time For Goal Achievement: 12/12/21 Potential to Achieve Goals: Good    Frequency     Barriers to discharge        Co-evaluation               AM-PAC PT "6 Clicks" Mobility  Outcome Measure Help needed turning from your back to your side while in a flat bed without using bedrails?: None Help needed moving from lying on your back to sitting on the side of a flat bed without using bedrails?: None Help needed moving to and from a bed to a chair (including a wheelchair)?: None Help needed standing up from a chair using your arms (e.g., wheelchair or bedside chair)?: None Help needed to walk in hospital room?: None Help needed climbing  3-5 steps with a railing? : A Little 6 Click Score: 23    End of Session Equipment Utilized During Treatment: Gait belt Activity Tolerance: Patient tolerated treatment well Patient left: in chair;with family/visitor present;with call bell/phone within reach Nurse Communication: Mobility status PT Visit Diagnosis: Muscle weakness (generalized) (M62.81);Unsteadiness on feet (R26.81)    Time: [id number] PT Time Calculation (min) (ACUTE ONLY): 21 min   Charges:   PT Evaluation $PT Eval Low Complexity: 1 Low          Brandon Melnick, SPT   Brandon Melnick 11/28/2021, 12:44 PM

## 2021-11-28 NOTE — Discharge Summary (Signed)
Physician Discharge Summary  [name]. WUX:[id number] DOB: 02/07/1932 DOA: 11/26/2021  PCP: Geoffry [place], MD  Admit date: 11/26/2021 Discharge date: 11/28/2021  Admitted From: Home  Disposition: Home   Recommendations for Outpatient Follow-up:  Follow up with PCP in 1-2 weeks Please obtain BMP/CBC in one week Monitor BP and renal function.  If epigastric pain reoccurs he will need evaluation by GI.   Home Health: None  Discharge Condition: Stable.  CODE STATUS: Full code Diet recommendation: Heart Healthy   Brief/Interim Summary: 87 year old with past medical history significant for CAD, hypertension, myasthenia gravis (ocular symptoms), gastroesophageal reflux, PE treated with Xarelto 2019, diabetes type 2, presented to [facility] complaining of epigastric and left shoulder pain, chest pain.  Patient was found to have severe hypertension systolic blood pressure 212. CT angio chest abdomen pelvis was negative for dissection or pulmonary embolism.   1-Hypertensive urgency: Hold Lisinopril due to rise in cr.  BP better controlled on Norvasc.      2-Epigastric pain: CT angio chest abdomen pelvis negative for dissection. Liver function test and lipase unremarkable. Right upper quadrant ultrasound: Trial of PPI. Tolerating diet. Denies abdominal pain.    Lactic acidosis: Could be related to volume depletion. CT angio abdomen pelvis unrevealing Improved with hydration   Constipation: Bowel regimen. Started on Bowel regimen.    Myasthenia gravis: Monitor.   Diabetes type 2 with long-term insulin use: Continue with SSI   Hyperlipidemia: On Pravachol.    GERD: Resume PPI   History of PE: Continue with Xarelto   Bronchitis; start nebulizer.  CKS stage IIIa;  Prior 1.2--1.4 Monitor    Discharge Diagnoses:  Principal Problem:   Hypertensive urgency Active Problems:   Myasthenia gravis (HCC)   GERD without esophagitis   Mixed hyperlipidemia   Type 2  diabetes mellitus without complication, without long-term current use of insulin (HCC)   Epigastric pain   History of pulmonary embolism   Lactic acidosis   Constipation   HTN (hypertension), malignant    Discharge Instructions  Discharge Instructions     Diet - low sodium heart healthy   Complete by: As directed    Increase activity slowly   Complete by: As directed       Allergies as of 11/28/2021       Reactions   Aspirin Other (See Comments)   REACTION: full strength; causes gi irritation        Medication List     STOP taking these medications    esomeprazole 20 MG capsule Commonly known as: NEXIUM   lisinopril 10 MG tablet Commonly known as: ZESTRIL       TAKE these medications    albuterol 108 (90 Base) MCG/ACT inhaler Commonly known as: VENTOLIN HFA Inhale 2 puffs into the lungs every 6 (six) hours as needed for wheezing.   amLODipine 10 MG tablet Commonly known as: NORVASC Take 1 tablet (10 mg total) by mouth daily. Start taking on: November 29, 2021   FERROUS SULFATE PO Take 1 tablet by mouth See admin instructions. Take one tablet by mouth with vitamin c - occasionally   pantoprazole 40 MG tablet Commonly known as: PROTONIX Take 1 tablet (40 mg total) by mouth 2 (two) times daily before a meal.   polyethylene glycol 17 g packet Commonly known as: MIRALAX / GLYCOLAX Take 17 g by mouth 2 (two) times daily.   pravastatin 20 MG tablet Commonly known as: PRAVACHOL Take 20 mg by mouth every morning.   Trelegy  Ellipta 100-62.5-25 MCG/ACT Aepb Generic drug: Fluticasone-Umeclidin-Vilant Inhale 1 puff into the lungs daily as needed (shortness of breath/wheezing).   VISINE OP Place 1 drop into both eyes daily as needed (dry eyes).   VITAMIN C PO Take 1 tablet by mouth See admin instructions. Take one tablet by mouth with iron (ferrous sulfate) - occasionally   Xarelto 20 MG Tabs tablet Generic drug: rivaroxaban Take 20 mg by mouth every  morning.        Follow-up Information     Burnard Bunting, MD Follow up in 1 week(s).   Specialty: Internal Medicine Contact information: [place] [address] [phone]                Allergies  Allergen Reactions   Aspirin Other (See Comments)    REACTION: full strength; causes gi irritation    Consultations: None   Procedures/Studies: DG Chest 2 View  Result Date: 11/26/2021 CLINICAL DATA:  Chest pain. EXAM: CHEST - 2 VIEW COMPARISON:  Chest x-ray dated 10/01/2018. FINDINGS: Heart size and mediastinal contours are stable. Lungs are clear. No pleural effusion or pneumothorax is seen. Osseous structures about the chest are unremarkable. IMPRESSION: No active cardiopulmonary disease. No evidence of pneumonia or pulmonary edema. Electronically Signed   By: Franki Cabot M.D.   On: 11/26/2021 15:20   CT Head Wo Contrast  Result Date: 11/27/2021 CLINICAL DATA:  Headache, intracranial hemorrhage suspected EXAM: CT HEAD WITHOUT CONTRAST TECHNIQUE: Contiguous axial images were obtained from the base of the skull through the vertex without intravenous contrast. COMPARISON:  May 2021 FINDINGS: Brain: There is no acute intracranial hemorrhage, mass effect, or edema. No new loss of gray-white differentiation. Prominence of the ventricles and sulci reflects similar parenchymal volume loss. Patchy low-density in the supratentorial white matter is nonspecific but probably reflects similar chronic microvascular ischemic changes. Vascular: There is atherosclerotic calcification at the skull base. Skull: Calvarium is unremarkable. Sinuses/Orbits: Paranasal sinus mucosal thickening. Included orbits are unremarkable. Other: Mastoid air cells are clear. IMPRESSION: No acute intracranial abnormality. Stable chronic findings detailed above. Electronically Signed   By: Macy Mis M.D.   On: 11/27/2021 10:35   CT Angio Chest/Abd/Pel for Dissection W and/or Wo  Contrast  Result Date: 11/26/2021 CLINICAL DATA:  Chest and abdominal pain radiating to the back, hypertension. EXAM: CT ANGIOGRAPHY CHEST, ABDOMEN AND PELVIS TECHNIQUE: Non-contrast CT of the chest was initially obtained. Multidetector CT imaging through the chest, abdomen and pelvis was performed using the standard protocol during bolus administration of intravenous contrast. Multiplanar reconstructed images and MIPs were obtained and reviewed to evaluate the vascular anatomy. CONTRAST:  163mL OMNIPAQUE IOHEXOL 350 MG/ML SOLN COMPARISON:  CT August 14, 2021 and October 02, 2018. FINDINGS: CTA CHEST FINDINGS Cardiovascular: Non contrasted examination demonstrates aortic and branch vessel atherosclerosis without intramural hematoma. No thoracic aortic aneurysm or dissection. No pulmonary embolus. Normal size heart. Three-vessel coronary artery calcifications. No significant pericardial effusion/thickening. Mediastinum/Nodes: No discrete thyroid nodule. No pathologically enlarged mediastinal, hilar or axillary lymph nodes. The trachea and esophagus are unremarkable. Lungs/Pleura: Mild diffuse bronchial wall thickening with areas of mucoid impaction. Tiny 2-3 mm or smaller right lower lobe pulmonary nodules some of which are in a tree in bud distribution. Irregular 8 mm in the paramedian right lower lobe on image 84/8 with surrounding ground-glass opacities. No pleural effusion. No pneumothorax. Musculoskeletal: Multilevel degenerative changes spine. No aggressive lytic or blastic lesion of bone. Review of the MIP images confirms the above findings. CTA ABDOMEN  AND PELVIS FINDINGS VASCULAR Aorta: Aortic atherosclerosis. Normal caliber aorta without aneurysm, dissection, vasculitis or significant stenosis. Celiac: Patent without evidence of aneurysm, dissection, vasculitis or significant stenosis. SMA: Patent without evidence of aneurysm, dissection, vasculitis or significant stenosis. Renals: Both renal arteries are  patent without evidence of aneurysm, dissection, vasculitis, fibromuscular dysplasia or significant stenosis. IMA: Patent without evidence of aneurysm, dissection, vasculitis or significant stenosis. Inflow: Stable slight dilation of the left common iliac artery measuring 18 mm. Otherwise patent without evidence of dissection, vasculitis or significant stenosis. Veins: No obvious venous abnormality within the limitations of this arterial phase study. Review of the MIP images confirms the above findings. NON-VASCULAR Hepatobiliary: No suspicious hepatic lesion. Gallbladder is unremarkable. No biliary ductal dilation. Pancreas: Slightly increased dilation of the main pancreatic duct now measuring 6 mm in the pancreatic head previously measuring 5 mm on CT Apr 30, 2021. No evidence of acute inflammation. Spleen: Within normal limits. Adrenals/Urinary Tract: Bilateral adrenal glands are unremarkable. Bilateral fluid density renal cysts are similar prior. No hydronephrosis. Urinary bladder is unremarkable for degree of distension. Stomach/Bowel: No enteric contrast was administered. Stomach is distended with ingested material without focal wall thickening. No pathologic dilation of small or large bowel. Lymphatic: No pathologically enlarged abdominal or pelvic lymph nodes. Stable calcifications in the right lower quadrant mesentery on image 232/7. Reproductive: Enlarged prostate gland. Other: No abdominopelvic free fluid.  Small left inguinal hernia. Musculoskeletal: Advanced/severe multilevel degenerative changes spine. Degenerative changes bilateral hips and SI joints. No acute osseous abnormality. Review of the MIP images confirms the above findings. IMPRESSION: 1. No evidence of thoracic or abdominal aortic aneurysm or dissection. No pulmonary embolus. 2. Tiny 2-3 mm or smaller right lower lobe pulmonary nodules some of which are in a tree in bud distribution, likely infectious or inflammatory in etiology. 3.  Irregular 8 mm in the paramedian right lower lobe with surrounding ground-glass opacities, which may be infectious or inflammatory in etiology. Non-contrast chest CT at 3-6 months is recommended. If nodules persist, subsequent management will be based upon the most suspicious nodule(s). This recommendation follows the consensus statement: Guidelines for Management of Incidental Pulmonary Nodules Detected on CT Images: From the Fleischner Society 2017; Radiology 2017; 284:228-243. 4. Slightly increased dilation of the main pancreatic duct now measuring 6 mm in the pancreatic head previously measuring 5 mm on CT Apr 30, 2021 common nonspecific and possibly reflecting senescent change. No evidence of acute inflammation or biliary ductal dilation. Consider short-term interval follow-up with pancreas protocol CT or MRI with and without contrast in 3-6 months. 5. Stable slight dilation of the left common iliac artery measuring 18 mm. 6. Enlarged prostate gland. 7.  Aortic Atherosclerosis (ICD10-I70.0). Electronically Signed   By: Dahlia Bailiff M.D.   On: 11/26/2021 19:01   US Abdomen Limited RUQ (LIVER/GB)  Result Date: 11/27/2021 CLINICAL DATA:  Abdominal pain EXAM: ULTRASOUND ABDOMEN LIMITED RIGHT UPPER QUADRANT COMPARISON:  11/26/2021 FINDINGS: Gallbladder: No gallstones or wall thickening visualized. No sonographic Murphy sign noted by sonographer. Common bile duct: Diameter: 4.6 mm. Liver: No focal lesion identified. Within normal limits in parenchymal echogenicity. Portal vein is patent on color Doppler imaging with normal direction of blood flow towards the liver. Other: Septated upper pole right renal cyst is noted measuring up to 3.8 cm. IMPRESSION: No acute right upper quadrant abnormality is noted. Septated right renal cyst is seen similar to that noted on prior CT. Electronically Signed   By: Inez Catalina M.D.   On: 11/27/2021  03:05     Subjective: He denies abdominal pain, tolerating diet.  Report  some left side fullness. Chronic   Discharge Exam: Vitals:   11/28/21 0824 11/28/21 0829  BP:  134/70  Pulse:  88  Resp:  20  Temp:  97.6 F (36.4 C)  SpO2: 96% 93%     General: Pt is alert, awake, not in acute distress Cardiovascular: RRR, S1/S2 +, no rubs, no gallops Respiratory: CTA bilaterally, no wheezing, no rhonchi Abdominal: Soft, NT, ND, bowel sounds + Extremities: no edema, no cyanosis    The results of significant diagnostics from this hospitalization (including imaging, microbiology, ancillary and laboratory) are listed below for reference.     Microbiology: Recent Results (from the past 240 hour(s))  Resp Panel by RT-PCR (Flu A&B, Covid) Nasopharyngeal Swab     Status: None   Collection Time: 11/26/21  4:05 PM   Specimen: Nasopharyngeal Swab; Nasopharyngeal(NP) swabs in vial transport medium  Result Value Ref Range Status   SARS Coronavirus 2 by RT PCR NEGATIVE NEGATIVE Final    Comment: (NOTE) SARS-CoV-2 target nucleic acids are NOT DETECTED.  The SARS-CoV-2 RNA is generally detectable in upper respiratory specimens during the acute phase of infection. The lowest concentration of SARS-CoV-2 viral copies this assay can detect is 138 copies/mL. A negative result does not preclude SARS-Cov-2 infection and should not be used as the sole basis for treatment or other patient management decisions. A negative result may occur with  improper specimen collection/handling, submission of specimen other than nasopharyngeal swab, presence of viral mutation(s) within the areas targeted by this assay, and inadequate number of viral copies(<138 copies/mL). A negative result must be combined with clinical observations, patient history, and epidemiological information. The expected result is Negative.  Fact Sheet for Patients:  EntrepreneurPulse.com.au  Fact Sheet for Healthcare Providers:  IncredibleEmployment.be  This test is no t  yet approved or cleared by the [place] FDA and  has been authorized for detection and/or diagnosis of SARS-CoV-2 by FDA under an Emergency Use Authorization (EUA). This EUA will remain  in effect (meaning this test can be used) for the duration of the COVID-19 declaration under Section 564(b)(1) of the Act, 21 U.S.C.section 360bbb-3(b)(1), unless the authorization is terminated  or revoked sooner.       Influenza A by PCR NEGATIVE NEGATIVE Final   Influenza B by PCR NEGATIVE NEGATIVE Final    Comment: (NOTE) The Xpert Xpress SARS-CoV-2/FLU/RSV plus assay is intended as an aid in the diagnosis of influenza from Nasopharyngeal swab specimens and should not be used as a sole basis for treatment. Nasal washings and aspirates are unacceptable for Xpert Xpress SARS-CoV-2/FLU/RSV testing.  Fact Sheet for Patients: EntrepreneurPulse.com.au  Fact Sheet for Healthcare Providers: IncredibleEmployment.be  This test is not yet approved or cleared by the [place] FDA and has been authorized for detection and/or diagnosis of SARS-CoV-2 by FDA under an Emergency Use Authorization (EUA). This EUA will remain in effect (meaning this test can be used) for the duration of the COVID-19 declaration under Section 564(b)(1) of the Act, 21 U.S.C. section 360bbb-3(b)(1), unless the authorization is terminated or revoked.  Performed at [facility], [place] [address], [place], [place] [address]   Culture, blood (Routine X 2) w Reflex to ID Panel     Status: None (Preliminary result)   Collection Time: 11/26/21  9:58 PM   Specimen: BLOOD RIGHT HAND  Result Value Ref Range Status   Specimen Description BLOOD RIGHT HAND  Final   Special Requests   Final    BOTTLES DRAWN AEROBIC AND ANAEROBIC Blood Culture results may not be optimal due to an inadequate volume of blood received in culture bottles   Culture   Final    NO GROWTH 2 DAYS Performed at [facility], [place] [address], [place], [place] [address]    Report Status PENDING  Incomplete  Culture, blood (Routine X 2) w Reflex to ID Panel     Status: None (Preliminary result)   Collection Time: 11/26/21 10:09 PM   Specimen: BLOOD RIGHT FOREARM  Result Value Ref Range Status   Specimen Description BLOOD RIGHT FOREARM  Final   Special Requests   Final    BOTTLES DRAWN AEROBIC AND ANAEROBIC Blood Culture results may not be optimal due to an inadequate volume of blood received in culture bottles   Culture   Final    NO GROWTH 2 DAYS Performed at [facility], [place] [address], [place], [place] [address]    Report Status PENDING  Incomplete     Labs: BNP (last 3 results) No results for input(s): BNP in the last 8760 hours. Basic Metabolic Panel: Recent Labs  Lab 11/26/21 1328 11/27/21 0238 11/28/21 0313  NA 137 135 134*  K 4.8 4.8 4.1  CL 99 98 99  CO2 30 28 28   GLUCOSE 109* 105* 120*  BUN 10 12 12   CREATININE 1.25* 1.40* 1.41*  CALCIUM 9.8 9.2 8.8*  MG  --  1.7  --    Liver Function Tests: Recent Labs  Lab 11/26/21 1615 11/27/21 0238  AST 18 17  ALT 13 12  ALKPHOS 90 89  BILITOT 0.7 1.0  PROT 7.0 6.1*  ALBUMIN 3.7 3.3*   Recent Labs  Lab 11/26/21 1615  LIPASE 32   No results for input(s): AMMONIA in the last 168 hours. CBC: Recent Labs  Lab 11/26/21 1328 11/27/21 0238  WBC 4.0 6.8  NEUTROABS  --  5.5  HGB 12.9* 12.1*  HCT 40.4 36.7*  MCV 92.4 90.6  PLT 262 233   Cardiac Enzymes: No results for input(s): CKTOTAL, CKMB, CKMBINDEX, TROPONINI in the last 168 hours. BNP: Invalid input(s): POCBNP CBG: Recent Labs  Lab 11/27/21 0802 11/27/21 1207 11/27/21 1831 11/27/21 2108 11/28/21 0625  GLUCAP 76 98 86 146* 81   D-Dimer No results for input(s): DDIMER in the last 72 hours. Hgb A1c Recent Labs    11/27/21 0238  HGBA1C 5.7*   Lipid Profile No results for input(s): CHOL, HDL, LDLCALC, TRIG, CHOLHDL, LDLDIRECT in the last 72  hours. Thyroid function studies No results for input(s): TSH, T4TOTAL, T3FREE, THYROIDAB in the last 72 hours.  Invalid input(s): FREET3 Anemia work up No results for input(s): VITAMINB12, FOLATE, FERRITIN, TIBC, IRON, RETICCTPCT in the last 72 hours. Urinalysis    Component Value Date/Time   COLORURINE YELLOW 11/27/2021 0310   APPEARANCEUR CLEAR 11/27/2021 0310   LABSPEC 1.031 (H) 11/27/2021 0310   PHURINE 8.0 11/27/2021 0310   GLUCOSEU NEGATIVE 11/27/2021 0310   HGBUR NEGATIVE 11/27/2021 0310   BILIRUBINUR NEGATIVE 11/27/2021 0310   KETONESUR 5 (A) 11/27/2021 0310   PROTEINUR 100 (A) 11/27/2021 0310   UROBILINOGEN 1.0 04/08/2011 1839   NITRITE NEGATIVE 11/27/2021 0310   LEUKOCYTESUR NEGATIVE 11/27/2021 0310   Sepsis Labs Invalid input(s): PROCALCITONIN,  WBC,  LACTICIDVEN Microbiology Recent Results (from the past 240 hour(s))  Resp Panel by RT-PCR (Flu A&B, Covid) Nasopharyngeal Swab  Status: None   Collection Time: 11/26/21  4:05 PM   Specimen: Nasopharyngeal Swab; Nasopharyngeal(NP) swabs in vial transport medium  Result Value Ref Range Status   SARS Coronavirus 2 by RT PCR NEGATIVE NEGATIVE Final    Comment: (NOTE) SARS-CoV-2 target nucleic acids are NOT DETECTED.  The SARS-CoV-2 RNA is generally detectable in upper respiratory specimens during the acute phase of infection. The lowest concentration of SARS-CoV-2 viral copies this assay can detect is 138 copies/mL. A negative result does not preclude SARS-Cov-2 infection and should not be used as the sole basis for treatment or other patient management decisions. A negative result may occur with  improper specimen collection/handling, submission of specimen other than nasopharyngeal swab, presence of viral mutation(s) within the areas targeted by this assay, and inadequate number of viral copies(<138 copies/mL). A negative result must be combined with clinical observations, patient history, and  epidemiological information. The expected result is Negative.  Fact Sheet for Patients:  EntrepreneurPulse.com.au  Fact Sheet for Healthcare Providers:  IncredibleEmployment.be  This test is no t yet approved or cleared by the [place] FDA and  has been authorized for detection and/or diagnosis of SARS-CoV-2 by FDA under an Emergency Use Authorization (EUA). This EUA will remain  in effect (meaning this test can be used) for the duration of the COVID-19 declaration under Section 564(b)(1) of the Act, 21 U.S.C.section 360bbb-3(b)(1), unless the authorization is terminated  or revoked sooner.       Influenza A by PCR NEGATIVE NEGATIVE Final   Influenza B by PCR NEGATIVE NEGATIVE Final    Comment: (NOTE) The Xpert Xpress SARS-CoV-2/FLU/RSV plus assay is intended as an aid in the diagnosis of influenza from Nasopharyngeal swab specimens and should not be used as a sole basis for treatment. Nasal washings and aspirates are unacceptable for Xpert Xpress SARS-CoV-2/FLU/RSV testing.  Fact Sheet for Patients: EntrepreneurPulse.com.au  Fact Sheet for Healthcare Providers: IncredibleEmployment.be  This test is not yet approved or cleared by the [place] FDA and has been authorized for detection and/or diagnosis of SARS-CoV-2 by FDA under an Emergency Use Authorization (EUA). This EUA will remain in effect (meaning this test can be used) for the duration of the COVID-19 declaration under Section 564(b)(1) of the Act, 21 U.S.C. section 360bbb-3(b)(1), unless the authorization is terminated or revoked.  Performed at [facility], [place] [address] [place], [place] [address]   Culture, blood (Routine X 2) w Reflex to ID Panel     Status: None (Preliminary result)   Collection Time: 11/26/21  9:58 PM   Specimen: BLOOD RIGHT HAND  Result Value Ref Range Status   Specimen Description BLOOD RIGHT HAND   Final   Special Requests   Final    BOTTLES DRAWN AEROBIC AND ANAEROBIC Blood Culture results may not be optimal due to an inadequate volume of blood received in culture bottles   Culture   Final    NO GROWTH 2 DAYS Performed at [facility], [place] [address], [place], [place] [address]    Report Status PENDING  Incomplete  Culture, blood (Routine X 2) w Reflex to ID Panel     Status: None (Preliminary result)   Collection Time: 11/26/21 10:09 PM   Specimen: BLOOD RIGHT FOREARM  Result Value Ref Range Status   Specimen Description BLOOD RIGHT FOREARM  Final   Special Requests   Final    BOTTLES DRAWN AEROBIC AND ANAEROBIC Blood Culture results may not be optimal due to an  inadequate volume of blood received in culture bottles   Culture   Final    NO GROWTH 2 DAYS Performed at [facility], [place] [address], [place], [place] [address]    Report Status PENDING  Incomplete     Time coordinating discharge: 40 minutes  SIGNED:   Elmarie Shiley, MD  [facility]

## 2021-11-28 NOTE — Evaluation (Signed)
Occupational Therapy Evaluation Patient Details Name: [name]. MRN: [id number] DOB: 1931-12-28 Today's Date: 11/28/2021   History of Present Illness Pt is a 87 yr old male who presented complaining of epigastric and L shoulder pain. CT negative for PE.  PMH but not limited to: coronary artery disease, hypertension, myasthenia gravis, gastroesophageal reflux disease, hyperlipidemia, pulmonary embolism, DM 2 benign prostatic hyperplasia, knee sx   Clinical Impression   Pt presented sitting at the EOB with wife. Pt at PLOF works and does not use any DME. Pt at this time was able to complete sit to stand transfers with supervision as reported ankle cramping and was able to complete single standing stance in session when attempting to rub ankle. Pt was educated about positional changes with ADLS to decrease risk of falling with the return to home. Pt currently with functional limitations due to the deficits listed below (see OT Problem List).  Pt will benefit from skilled OT to increase their safety and independence with ADL and functional mobility for ADL to facilitate discharge to venue listed below.   Sitting EOB 126/80 Standing: 114/71 Post ambulation of 3 mins 131/72 Post ambulation of 5 mins 148/72 Resting 148/89       Recommendations for follow up therapy are one component of a multi-disciplinary discharge planning process, led by the attending physician.  Recommendations may be updated based on patient status, additional functional criteria and insurance authorization.   Follow Up Recommendations  No OT follow up    Assistance Recommended at Discharge PRN  Functional Status Assessment  Patient has had a recent decline in their functional status and demonstrates the ability to make significant improvements in function in a reasonable and predictable amount of time.  Equipment Recommendations  None recommended by OT    Recommendations for Other Services       Precautions /  Restrictions Precautions Precautions: Fall Precaution Comments: monitor BP Restrictions Weight Bearing Restrictions: No      Mobility Bed Mobility Overal bed mobility:  (presented sitting at EOB)                  Transfers Overall transfer level: Needs assistance Equipment used: None Transfers: Sit to/from Stand Sit to Stand: Modified independent (Device/Increase time)           General transfer comment: increase time with first transfer as pt reported a cramp in ankle when first attempting to ambulate      Balance Overall balance assessment: No apparent balance deficits (not formally assessed) (Pt able to stand on one foot as attempting to rub ankle)                                         ADL either performed or assessed with clinical judgement   ADL Overall ADL's : Needs assistance/impaired Eating/Feeding: Independent;Sitting   Grooming: Wash/dry hands;Wash/dry face;Modified independent;Cueing for safety;Cueing for sequencing;Standing   Upper Body Bathing: Modified independent;Cueing for safety;Cueing for sequencing;Sitting   Lower Body Bathing: Supervison/ safety;Cueing for safety;Cueing for sequencing;Sit to/from stand   Upper Body Dressing : Modified independent;Sitting   Lower Body Dressing: Supervision/safety;Sit to/from stand   Toilet Transfer: Supervision/safety   Toileting- Architect and Hygiene: Modified independent   Tub/ Engineer, structural: Supervision/safety   Functional mobility during ADLs: Min guard       Vision Baseline Vision/History: 1 Wears glasses Ability to See in Adequate Light: 0  Adequate Patient Visual Report: No change from baseline       Perception     Praxis      Pertinent Vitals/Pain Pain Assessment: Faces Faces Pain Scale: Hurts little more Pain Location: L temporal Pain Descriptors / Indicators: Aching Pain Intervention(s): Limited activity within patient's tolerance;Monitored  during session     Hand Dominance     Extremity/Trunk Assessment Upper Extremity Assessment Upper Extremity Assessment: Overall WFL for tasks assessed   Lower Extremity Assessment Lower Extremity Assessment: Defer to PT evaluation   Cervical / Trunk Assessment Cervical / Trunk Assessment: Kyphotic   Communication Communication Communication: No difficulties   Cognition Arousal/Alertness: Awake/alert Behavior During Therapy: WFL for tasks assessed/performed Overall Cognitive Status: Within Functional Limits for tasks assessed                                       General Comments       Exercises     Shoulder Instructions      Home Living Family/patient expects to be discharged to:: Private residence Living Arrangements: Spouse/significant other Available Help at Discharge: Family Type of Home: House Home Access: Level entry     Home Layout: One level     Bathroom Shower/Tub: Producer, television/film/video: Standard Bathroom Accessibility: Yes   Home Equipment: Rollator (4 wheels);BSC/3in1;Shower seat - built in;Grab bars - tub/shower          Prior Functioning/Environment Prior Level of Function : Working/employed             Mobility Comments: no AE at [employer]          OT Problem List: Decreased strength;Decreased activity tolerance;Decreased knowledge of use of DME or AE;Decreased safety awareness;Pain      OT Treatment/Interventions: Self-care/ADL training;Therapeutic exercise;Therapeutic activities;Patient/family education;Balance training    OT Goals(Current goals can be found in the care plan section) Acute Rehab OT Goals Patient Stated Goal: to move more OT Goal Formulation: With patient Time For Goal Achievement: 12/16/21 Potential to Achieve Goals: Good  OT Frequency:     Barriers to D/C:            Co-evaluation              AM-PAC OT "6 Clicks" Daily Activity     Outcome Measure Help from another  person eating meals?: None Help from another person taking care of personal grooming?: None Help from another person toileting, which includes using toliet, bedpan, or urinal?: None Help from another person bathing (including washing, rinsing, drying)?: None Help from another person to put on and taking off regular upper body clothing?: None Help from another person to put on and taking off regular lower body clothing?: None 6 Click Score: 24   End of Session Equipment Utilized During Treatment: Gait belt Nurse Communication: Mobility status  Activity Tolerance: Patient tolerated treatment well Patient left: in chair;with call bell/phone within reach;with family/visitor present  OT Visit Diagnosis: Unsteadiness on feet (R26.81);Pain;Muscle weakness (generalized) (M62.81) Pain - Right/Left: Left Pain - part of body:  (temporal of head)                Time: 6712-4580 OT Time Calculation (min): 36 min Charges:  OT General Charges $OT Visit: 1 Visit OT Evaluation $OT Eval Low Complexity: 1 Low OT Treatments $Self Care/Home Management : 8-22 mins  Alphia Moh OTR/L  Acute Rehab Services  [phone]  office number [phone] pager number   Alphia Moh 11/28/2021, 9:22 AM

## 2021-11-28 NOTE — Progress Notes (Signed)
Patient given discharge instructions. Wife and daughter present. PIV removed. Telemetry box removed, CCMD notified. Patient taken by wheelchair to vehicle by staff.  Kenard Gower, RN

## 2021-12-01 LAB — CULTURE, BLOOD (ROUTINE X 2)
Culture: NO GROWTH
Culture: NO GROWTH

## 2021-12-06 DIAGNOSIS — N1831 Chronic kidney disease, stage 3a: Secondary | ICD-10-CM | POA: Diagnosis not present

## 2021-12-06 DIAGNOSIS — E785 Hyperlipidemia, unspecified: Secondary | ICD-10-CM | POA: Diagnosis not present

## 2021-12-06 DIAGNOSIS — R1013 Epigastric pain: Secondary | ICD-10-CM | POA: Diagnosis not present

## 2021-12-06 DIAGNOSIS — I2699 Other pulmonary embolism without acute cor pulmonale: Secondary | ICD-10-CM | POA: Diagnosis not present

## 2021-12-06 DIAGNOSIS — K5900 Constipation, unspecified: Secondary | ICD-10-CM | POA: Diagnosis not present

## 2021-12-06 DIAGNOSIS — E1129 Type 2 diabetes mellitus with other diabetic kidney complication: Secondary | ICD-10-CM | POA: Diagnosis not present

## 2021-12-06 DIAGNOSIS — I10 Essential (primary) hypertension: Secondary | ICD-10-CM | POA: Diagnosis not present

## 2022-01-15 DIAGNOSIS — G609 Hereditary and idiopathic neuropathy, unspecified: Secondary | ICD-10-CM | POA: Diagnosis not present

## 2022-01-15 DIAGNOSIS — E1129 Type 2 diabetes mellitus with other diabetic kidney complication: Secondary | ICD-10-CM | POA: Diagnosis not present

## 2022-01-15 DIAGNOSIS — N1831 Chronic kidney disease, stage 3a: Secondary | ICD-10-CM | POA: Diagnosis not present

## 2022-01-15 DIAGNOSIS — I130 Hypertensive heart and chronic kidney disease with heart failure and stage 1 through stage 4 chronic kidney disease, or unspecified chronic kidney disease: Secondary | ICD-10-CM | POA: Diagnosis not present

## 2022-01-17 DIAGNOSIS — E1129 Type 2 diabetes mellitus with other diabetic kidney complication: Secondary | ICD-10-CM | POA: Diagnosis not present

## 2022-01-18 DIAGNOSIS — E1129 Type 2 diabetes mellitus with other diabetic kidney complication: Secondary | ICD-10-CM | POA: Diagnosis not present

## 2022-01-18 DIAGNOSIS — I10 Essential (primary) hypertension: Secondary | ICD-10-CM | POA: Diagnosis not present

## 2022-02-12 DIAGNOSIS — R972 Elevated prostate specific antigen [PSA]: Secondary | ICD-10-CM | POA: Diagnosis not present

## 2022-02-19 DIAGNOSIS — R972 Elevated prostate specific antigen [PSA]: Secondary | ICD-10-CM | POA: Diagnosis not present

## 2022-02-20 ENCOUNTER — Other Ambulatory Visit: Payer: Self-pay | Admitting: Urology

## 2022-02-20 DIAGNOSIS — R972 Elevated prostate specific antigen [PSA]: Secondary | ICD-10-CM

## 2022-03-13 ENCOUNTER — Ambulatory Visit
Admission: RE | Admit: 2022-03-13 | Discharge: 2022-03-13 | Disposition: A | Discharge status: Home / Self Care | Payer: Medicare Other | Source: Ambulatory Visit | Attending: Urology | Admitting: Urology

## 2022-03-13 ENCOUNTER — Other Ambulatory Visit: Payer: Self-pay

## 2022-03-13 DIAGNOSIS — R590 Localized enlarged lymph nodes: Secondary | ICD-10-CM | POA: Diagnosis not present

## 2022-03-13 DIAGNOSIS — R972 Elevated prostate specific antigen [PSA]: Secondary | ICD-10-CM | POA: Diagnosis not present

## 2022-03-13 DIAGNOSIS — R188 Other ascites: Secondary | ICD-10-CM | POA: Diagnosis not present

## 2022-03-13 DIAGNOSIS — I723 Aneurysm of iliac artery: Secondary | ICD-10-CM | POA: Diagnosis not present

## 2022-03-13 MED ORDER — GADOBENATE DIMEGLUMINE 529 MG/ML IV SOLN
15.0000 mL | Freq: Once | INTRAVENOUS | Status: AC | PRN
  Administered 2022-03-13: 15 mL via INTRAVENOUS

## 2022-03-26 DIAGNOSIS — R972 Elevated prostate specific antigen [PSA]: Secondary | ICD-10-CM | POA: Diagnosis not present

## 2022-04-23 DIAGNOSIS — K4090 Unilateral inguinal hernia, without obstruction or gangrene, not specified as recurrent: Secondary | ICD-10-CM | POA: Diagnosis not present

## 2022-04-23 DIAGNOSIS — I129 Hypertensive chronic kidney disease with stage 1 through stage 4 chronic kidney disease, or unspecified chronic kidney disease: Secondary | ICD-10-CM | POA: Diagnosis not present

## 2022-04-23 DIAGNOSIS — N1831 Chronic kidney disease, stage 3a: Secondary | ICD-10-CM | POA: Diagnosis not present

## 2022-04-23 DIAGNOSIS — I700 Atherosclerosis of aorta: Secondary | ICD-10-CM | POA: Diagnosis not present

## 2022-04-23 DIAGNOSIS — G609 Hereditary and idiopathic neuropathy, unspecified: Secondary | ICD-10-CM | POA: Diagnosis not present

## 2022-04-23 DIAGNOSIS — E1129 Type 2 diabetes mellitus with other diabetic kidney complication: Secondary | ICD-10-CM | POA: Diagnosis not present

## 2022-08-01 DIAGNOSIS — E1140 Type 2 diabetes mellitus with diabetic neuropathy, unspecified: Secondary | ICD-10-CM | POA: Diagnosis not present

## 2022-08-01 DIAGNOSIS — I129 Hypertensive chronic kidney disease with stage 1 through stage 4 chronic kidney disease, or unspecified chronic kidney disease: Secondary | ICD-10-CM | POA: Diagnosis not present

## 2022-08-01 DIAGNOSIS — N1831 Chronic kidney disease, stage 3a: Secondary | ICD-10-CM | POA: Diagnosis not present

## 2022-08-01 DIAGNOSIS — G609 Hereditary and idiopathic neuropathy, unspecified: Secondary | ICD-10-CM | POA: Diagnosis not present

## 2022-08-06 DIAGNOSIS — M25561 Pain in right knee: Secondary | ICD-10-CM | POA: Diagnosis not present

## 2022-08-06 DIAGNOSIS — M545 Low back pain, unspecified: Secondary | ICD-10-CM | POA: Diagnosis not present

## 2022-08-28 DIAGNOSIS — M545 Low back pain, unspecified: Secondary | ICD-10-CM | POA: Diagnosis not present

## 2022-09-12 ENCOUNTER — Other Ambulatory Visit: Payer: Self-pay | Admitting: Orthopedic Surgery

## 2022-09-12 DIAGNOSIS — M25551 Pain in right hip: Secondary | ICD-10-CM

## 2022-09-14 ENCOUNTER — Ambulatory Visit
Admission: RE | Admit: 2022-09-14 | Discharge: 2022-09-14 | Disposition: A | Discharge status: Home / Self Care | Payer: Medicare Other | Source: Ambulatory Visit | Attending: Orthopedic Surgery | Admitting: Orthopedic Surgery

## 2022-09-14 DIAGNOSIS — M25551 Pain in right hip: Secondary | ICD-10-CM

## 2022-09-19 DIAGNOSIS — M545 Low back pain, unspecified: Secondary | ICD-10-CM | POA: Diagnosis not present

## 2022-10-10 DIAGNOSIS — I10 Essential (primary) hypertension: Secondary | ICD-10-CM | POA: Diagnosis not present

## 2022-10-10 DIAGNOSIS — R7989 Other specified abnormal findings of blood chemistry: Secondary | ICD-10-CM | POA: Diagnosis not present

## 2022-10-10 DIAGNOSIS — E785 Hyperlipidemia, unspecified: Secondary | ICD-10-CM | POA: Diagnosis not present

## 2022-10-10 DIAGNOSIS — Z125 Encounter for screening for malignant neoplasm of prostate: Secondary | ICD-10-CM | POA: Diagnosis not present

## 2022-10-10 DIAGNOSIS — I700 Atherosclerosis of aorta: Secondary | ICD-10-CM | POA: Diagnosis not present

## 2022-10-10 DIAGNOSIS — E1129 Type 2 diabetes mellitus with other diabetic kidney complication: Secondary | ICD-10-CM | POA: Diagnosis not present

## 2022-10-17 DIAGNOSIS — Z1339 Encounter for screening examination for other mental health and behavioral disorders: Secondary | ICD-10-CM | POA: Diagnosis not present

## 2022-10-17 DIAGNOSIS — Z1331 Encounter for screening for depression: Secondary | ICD-10-CM | POA: Diagnosis not present

## 2022-10-17 DIAGNOSIS — G609 Hereditary and idiopathic neuropathy, unspecified: Secondary | ICD-10-CM | POA: Diagnosis not present

## 2022-10-17 DIAGNOSIS — E1129 Type 2 diabetes mellitus with other diabetic kidney complication: Secondary | ICD-10-CM | POA: Diagnosis not present

## 2022-10-17 DIAGNOSIS — Z23 Encounter for immunization: Secondary | ICD-10-CM | POA: Diagnosis not present

## 2022-10-17 DIAGNOSIS — Z0000 Encounter for general adult medical examination without abnormal findings: Secondary | ICD-10-CM | POA: Diagnosis not present

## 2022-10-17 DIAGNOSIS — I130 Hypertensive heart and chronic kidney disease with heart failure and stage 1 through stage 4 chronic kidney disease, or unspecified chronic kidney disease: Secondary | ICD-10-CM | POA: Diagnosis not present

## 2022-10-17 DIAGNOSIS — E785 Hyperlipidemia, unspecified: Secondary | ICD-10-CM | POA: Diagnosis not present

## 2022-10-17 DIAGNOSIS — I700 Atherosclerosis of aorta: Secondary | ICD-10-CM | POA: Diagnosis not present

## 2022-10-17 DIAGNOSIS — R82998 Other abnormal findings in urine: Secondary | ICD-10-CM | POA: Diagnosis not present

## 2022-10-17 DIAGNOSIS — N1831 Chronic kidney disease, stage 3a: Secondary | ICD-10-CM | POA: Diagnosis not present

## 2022-10-18 DIAGNOSIS — R972 Elevated prostate specific antigen [PSA]: Secondary | ICD-10-CM | POA: Diagnosis not present

## 2022-11-08 DIAGNOSIS — H52203 Unspecified astigmatism, bilateral: Secondary | ICD-10-CM | POA: Diagnosis not present

## 2022-11-08 DIAGNOSIS — H532 Diplopia: Secondary | ICD-10-CM | POA: Diagnosis not present

## 2022-11-08 DIAGNOSIS — H25813 Combined forms of age-related cataract, bilateral: Secondary | ICD-10-CM | POA: Diagnosis not present

## 2022-11-30 DIAGNOSIS — H2513 Age-related nuclear cataract, bilateral: Secondary | ICD-10-CM | POA: Diagnosis not present

## 2022-12-10 DIAGNOSIS — E1129 Type 2 diabetes mellitus with other diabetic kidney complication: Secondary | ICD-10-CM | POA: Diagnosis not present

## 2022-12-10 DIAGNOSIS — I130 Hypertensive heart and chronic kidney disease with heart failure and stage 1 through stage 4 chronic kidney disease, or unspecified chronic kidney disease: Secondary | ICD-10-CM | POA: Diagnosis not present

## 2022-12-10 DIAGNOSIS — I700 Atherosclerosis of aorta: Secondary | ICD-10-CM | POA: Diagnosis not present

## 2022-12-10 DIAGNOSIS — N1831 Chronic kidney disease, stage 3a: Secondary | ICD-10-CM | POA: Diagnosis not present

## 2022-12-31 DIAGNOSIS — E1140 Type 2 diabetes mellitus with diabetic neuropathy, unspecified: Secondary | ICD-10-CM | POA: Diagnosis not present

## 2022-12-31 DIAGNOSIS — I872 Venous insufficiency (chronic) (peripheral): Secondary | ICD-10-CM | POA: Diagnosis not present

## 2022-12-31 DIAGNOSIS — L603 Nail dystrophy: Secondary | ICD-10-CM | POA: Diagnosis not present

## 2022-12-31 DIAGNOSIS — I739 Peripheral vascular disease, unspecified: Secondary | ICD-10-CM | POA: Diagnosis not present

## 2022-12-31 DIAGNOSIS — I130 Hypertensive heart and chronic kidney disease with heart failure and stage 1 through stage 4 chronic kidney disease, or unspecified chronic kidney disease: Secondary | ICD-10-CM | POA: Diagnosis not present

## 2023-01-15 DIAGNOSIS — M792 Neuralgia and neuritis, unspecified: Secondary | ICD-10-CM | POA: Diagnosis not present

## 2023-01-15 DIAGNOSIS — E1151 Type 2 diabetes mellitus with diabetic peripheral angiopathy without gangrene: Secondary | ICD-10-CM | POA: Diagnosis not present

## 2023-01-15 DIAGNOSIS — M19072 Primary osteoarthritis, left ankle and foot: Secondary | ICD-10-CM | POA: Diagnosis not present

## 2023-01-15 DIAGNOSIS — I739 Peripheral vascular disease, unspecified: Secondary | ICD-10-CM | POA: Diagnosis not present

## 2023-01-15 DIAGNOSIS — B353 Tinea pedis: Secondary | ICD-10-CM | POA: Diagnosis not present

## 2023-01-15 DIAGNOSIS — M19071 Primary osteoarthritis, right ankle and foot: Secondary | ICD-10-CM | POA: Diagnosis not present

## 2023-01-15 DIAGNOSIS — G629 Polyneuropathy, unspecified: Secondary | ICD-10-CM | POA: Diagnosis not present

## 2023-01-16 DIAGNOSIS — H269 Unspecified cataract: Secondary | ICD-10-CM | POA: Diagnosis not present

## 2023-01-16 DIAGNOSIS — H268 Other specified cataract: Secondary | ICD-10-CM | POA: Diagnosis not present

## 2023-01-16 DIAGNOSIS — H2512 Age-related nuclear cataract, left eye: Secondary | ICD-10-CM | POA: Diagnosis not present

## 2023-02-20 DIAGNOSIS — H268 Other specified cataract: Secondary | ICD-10-CM | POA: Diagnosis not present

## 2023-02-20 DIAGNOSIS — H2511 Age-related nuclear cataract, right eye: Secondary | ICD-10-CM | POA: Diagnosis not present

## 2023-02-20 DIAGNOSIS — H269 Unspecified cataract: Secondary | ICD-10-CM | POA: Diagnosis not present

## 2023-02-27 DIAGNOSIS — I739 Peripheral vascular disease, unspecified: Secondary | ICD-10-CM | POA: Diagnosis not present

## 2023-03-06 DIAGNOSIS — M19072 Primary osteoarthritis, left ankle and foot: Secondary | ICD-10-CM | POA: Diagnosis not present

## 2023-03-06 DIAGNOSIS — I739 Peripheral vascular disease, unspecified: Secondary | ICD-10-CM | POA: Diagnosis not present

## 2023-03-06 DIAGNOSIS — E1151 Type 2 diabetes mellitus with diabetic peripheral angiopathy without gangrene: Secondary | ICD-10-CM | POA: Diagnosis not present

## 2023-03-06 DIAGNOSIS — B353 Tinea pedis: Secondary | ICD-10-CM | POA: Diagnosis not present

## 2023-03-06 DIAGNOSIS — G629 Polyneuropathy, unspecified: Secondary | ICD-10-CM | POA: Diagnosis not present

## 2023-03-06 DIAGNOSIS — M792 Neuralgia and neuritis, unspecified: Secondary | ICD-10-CM | POA: Diagnosis not present

## 2023-03-06 DIAGNOSIS — M19071 Primary osteoarthritis, right ankle and foot: Secondary | ICD-10-CM | POA: Diagnosis not present

## 2023-03-11 ENCOUNTER — Other Ambulatory Visit (HOSPITAL_COMMUNITY): Payer: Self-pay | Admitting: Internal Medicine

## 2023-03-11 DIAGNOSIS — I771 Stricture of artery: Secondary | ICD-10-CM

## 2023-03-11 DIAGNOSIS — I129 Hypertensive chronic kidney disease with stage 1 through stage 4 chronic kidney disease, or unspecified chronic kidney disease: Secondary | ICD-10-CM | POA: Diagnosis not present

## 2023-03-11 DIAGNOSIS — E1129 Type 2 diabetes mellitus with other diabetic kidney complication: Secondary | ICD-10-CM | POA: Diagnosis not present

## 2023-03-11 DIAGNOSIS — N1831 Chronic kidney disease, stage 3a: Secondary | ICD-10-CM | POA: Diagnosis not present

## 2023-03-11 DIAGNOSIS — I739 Peripheral vascular disease, unspecified: Secondary | ICD-10-CM | POA: Diagnosis not present

## 2023-03-12 ENCOUNTER — Encounter (HOSPITAL_COMMUNITY): Payer: Self-pay

## 2023-03-12 ENCOUNTER — Ambulatory Visit (HOSPITAL_COMMUNITY)
Admission: RE | Admit: 2023-03-12 | Discharge: 2023-03-12 | Disposition: A | Discharge status: Home / Self Care | Payer: Medicare Other | Source: Ambulatory Visit | Attending: Internal Medicine | Admitting: Internal Medicine

## 2023-03-12 DIAGNOSIS — I739 Peripheral vascular disease, unspecified: Secondary | ICD-10-CM | POA: Insufficient documentation

## 2023-03-12 DIAGNOSIS — I771 Stricture of artery: Secondary | ICD-10-CM | POA: Diagnosis not present

## 2023-03-29 DIAGNOSIS — H532 Diplopia: Secondary | ICD-10-CM | POA: Diagnosis not present

## 2023-03-29 DIAGNOSIS — H1789 Other corneal scars and opacities: Secondary | ICD-10-CM | POA: Diagnosis not present

## 2023-04-11 DIAGNOSIS — R972 Elevated prostate specific antigen [PSA]: Secondary | ICD-10-CM | POA: Diagnosis not present

## 2023-04-12 DIAGNOSIS — H532 Diplopia: Secondary | ICD-10-CM | POA: Diagnosis not present

## 2023-04-18 DIAGNOSIS — R972 Elevated prostate specific antigen [PSA]: Secondary | ICD-10-CM | POA: Diagnosis not present

## 2023-05-21 DIAGNOSIS — H532 Diplopia: Secondary | ICD-10-CM | POA: Diagnosis not present

## 2023-06-19 DIAGNOSIS — I129 Hypertensive chronic kidney disease with stage 1 through stage 4 chronic kidney disease, or unspecified chronic kidney disease: Secondary | ICD-10-CM | POA: Diagnosis not present

## 2023-06-19 DIAGNOSIS — M1990 Unspecified osteoarthritis, unspecified site: Secondary | ICD-10-CM | POA: Diagnosis not present

## 2023-06-19 DIAGNOSIS — E1129 Type 2 diabetes mellitus with other diabetic kidney complication: Secondary | ICD-10-CM | POA: Diagnosis not present

## 2023-06-19 DIAGNOSIS — L309 Dermatitis, unspecified: Secondary | ICD-10-CM | POA: Diagnosis not present

## 2023-06-19 DIAGNOSIS — N1831 Chronic kidney disease, stage 3a: Secondary | ICD-10-CM | POA: Diagnosis not present

## 2023-07-02 DIAGNOSIS — H532 Diplopia: Secondary | ICD-10-CM | POA: Diagnosis not present

## 2023-10-07 DIAGNOSIS — R972 Elevated prostate specific antigen [PSA]: Secondary | ICD-10-CM | POA: Diagnosis not present

## 2023-10-14 DIAGNOSIS — E1129 Type 2 diabetes mellitus with other diabetic kidney complication: Secondary | ICD-10-CM | POA: Diagnosis not present

## 2023-10-14 DIAGNOSIS — Z1389 Encounter for screening for other disorder: Secondary | ICD-10-CM | POA: Diagnosis not present

## 2023-10-14 DIAGNOSIS — R972 Elevated prostate specific antigen [PSA]: Secondary | ICD-10-CM | POA: Diagnosis not present

## 2023-10-14 DIAGNOSIS — Z0001 Encounter for general adult medical examination with abnormal findings: Secondary | ICD-10-CM | POA: Diagnosis not present

## 2023-10-21 DIAGNOSIS — I130 Hypertensive heart and chronic kidney disease with heart failure and stage 1 through stage 4 chronic kidney disease, or unspecified chronic kidney disease: Secondary | ICD-10-CM | POA: Diagnosis not present

## 2023-10-21 DIAGNOSIS — Z0000 Encounter for general adult medical examination without abnormal findings: Secondary | ICD-10-CM | POA: Diagnosis not present

## 2023-10-21 DIAGNOSIS — Z1339 Encounter for screening examination for other mental health and behavioral disorders: Secondary | ICD-10-CM | POA: Diagnosis not present

## 2023-10-21 DIAGNOSIS — E1140 Type 2 diabetes mellitus with diabetic neuropathy, unspecified: Secondary | ICD-10-CM | POA: Diagnosis not present

## 2023-10-21 DIAGNOSIS — G609 Hereditary and idiopathic neuropathy, unspecified: Secondary | ICD-10-CM | POA: Diagnosis not present

## 2023-10-21 DIAGNOSIS — I700 Atherosclerosis of aorta: Secondary | ICD-10-CM | POA: Diagnosis not present

## 2023-10-21 DIAGNOSIS — Z1331 Encounter for screening for depression: Secondary | ICD-10-CM | POA: Diagnosis not present

## 2023-10-21 DIAGNOSIS — N1832 Chronic kidney disease, stage 3b: Secondary | ICD-10-CM | POA: Diagnosis not present

## 2023-10-21 DIAGNOSIS — D6869 Other thrombophilia: Secondary | ICD-10-CM | POA: Diagnosis not present

## 2023-10-21 DIAGNOSIS — Z23 Encounter for immunization: Secondary | ICD-10-CM | POA: Diagnosis not present

## 2023-10-21 DIAGNOSIS — I739 Peripheral vascular disease, unspecified: Secondary | ICD-10-CM | POA: Diagnosis not present

## 2023-10-21 DIAGNOSIS — R972 Elevated prostate specific antigen [PSA]: Secondary | ICD-10-CM | POA: Diagnosis not present

## 2023-10-21 DIAGNOSIS — R82998 Other abnormal findings in urine: Secondary | ICD-10-CM | POA: Diagnosis not present

## 2023-10-21 DIAGNOSIS — E1129 Type 2 diabetes mellitus with other diabetic kidney complication: Secondary | ICD-10-CM | POA: Diagnosis not present

## 2023-10-21 DIAGNOSIS — E785 Hyperlipidemia, unspecified: Secondary | ICD-10-CM | POA: Diagnosis not present

## 2023-10-21 DIAGNOSIS — E1151 Type 2 diabetes mellitus with diabetic peripheral angiopathy without gangrene: Secondary | ICD-10-CM | POA: Diagnosis not present

## 2023-10-22 ENCOUNTER — Telehealth: Payer: Self-pay | Admitting: Radiation Oncology

## 2023-10-22 NOTE — Telephone Encounter (Signed)
Called patient to schedule a consultation w. Dr. Manning. No answer, LVM for a return call.  ?

## 2023-10-24 ENCOUNTER — Encounter: Payer: Self-pay | Admitting: Radiation Oncology

## 2023-10-24 NOTE — Progress Notes (Signed)
GU Location of Tumor / Histology: Prostate Ca  PSA 18.40 on 10/07/2023  PSA  15.80 on 04/12/2023 PSA  11.7   on  02/13/2022 PSA  9.6      03/13/2022 Dr. Traci Sermon MR Prostate with/without Contrast CLINICAL DATA:  Elevated PSA. Prior biopsy 10 years ago without malignancy.  FINDINGS: Prostate: Altered prostatic morphology, likely due to prior TURP. The central gland anatomy is distorted, without convincing evidence of dominant central gland nodule.     Within the posterior left peripheral zone, base to midgland, is a 1.6 x 1.4 cm area of moderate T2 hypointensity on 15/8 and 42/9. Measures on the order of 1.3 cm craniocaudal on coronal image 13/3.  Corresponds to decreased signal on ADC map 15/6 and hyperintensity on long B value diffusion weighted image 15/7. Early post-contrast enhancement including on 232/12. PI-RADS(v2.1)-5.   Volume: 4.2 x 3.3 x 4.0 cm (volume = 29 cm^3)   Transcapsular spread:  Absent   Seminal vesicle involvement: Absent   Neurovascular bundle involvement: Absent   Pelvic adenopathy: Absent   Bone metastasis: Absent   Other findings: Normal urinary bladder. Left common iliac artery ectasia at 1.6 cm. Small volume pelvic fluid including on 01/08.  IMPRESSION: 1. 1.6 cm multiparametric signal abnormality within the left posterior base to mid gland peripheral zone, consistent with higher grade disease. PI-RADS(v2.1)-5. 2.  No evidence of locally advanced or pelvic metastatic disease. 3. Small volume nonspecific pelvic fluid.   Past/Anticipated interventions by urology, if any: NA  Past/Anticipated interventions by medical oncology, if any: NA  Weight changes, if any:  No  IPSS:  3 SHIM:  22  Bowel/Bladder complaints, if any:  No bladder, mild constipation has taken Miralax over last few months.  Nausea/Vomiting, if any:  No  Pain issues, if any:  0/10  SAFETY ISSUES: Prior radiation?  No Pacemaker/ICD?  No Possible current  pregnancy? Male Is the patient on methotrexate? No  Current Complaints / other details:

## 2023-10-28 ENCOUNTER — Encounter: Payer: Self-pay | Admitting: Urology

## 2023-10-28 DIAGNOSIS — R972 Elevated prostate specific antigen [PSA]: Secondary | ICD-10-CM | POA: Insufficient documentation

## 2023-10-28 NOTE — Progress Notes (Signed)
Radiation Oncology         [phone]) [phone] ________________________________  Initial Outpatient Consultation  Name: [name]. MRN: [id number]  Date: 10/29/2023  DOB: [birth year]  [id number], [name], MD  [name], MD   REFERRING PHYSICIAN: [name], MD  DIAGNOSIS: 89 y.o. gentleman with rising PSA of 18.4 and PI-RADS 5 lesion on 02/2022 MRI.  No diagnosis found.  HISTORY OF PRESENT ILLNESS: [name]. is a 89 y.o. male with a diagnosis of prostate cancer. He has a history of prior TURP in 2000 and left ureteroscopy with laser lithotripsy in 04/2011 under Dr. Aldean Ast. More recently, he was noted to have an elevated PSA of 9.6 by his primary care physician, Dr. Marland Kitchen  Accordingly, he was referred for evaluation in urology by Dr. Lafonda Mosses on 11/20/21. He returned for follow up in 01/2022, and his PSA had risen to 11.7. This prompted a prostate MRI on 03/13/22 showing: 1.6 cm multiparametric signal abnormality within left posterior base to mid gland peripheral zone (PI-RADS 5); no evidence of locally advanced or pelvic metastatic disease. Given his advanced age, they opted for watchful waiting. Since that time, his PSA has continued to rise, and most recent value from 10/08/23 was 18.4. He has not yet undergone prostate biopsy.  The patient reviewed the biopsy results with his urologist and he has kindly been referred today for discussion of potential radiation treatment options.   PREVIOUS RADIATION THERAPY: No  PAST MEDICAL HISTORY:  Past Medical History:  Diagnosis Date   Aortic atherosclerosis (HCC)    Chest pain    Chronic kidney disease (CKD), stage II (mild)    Coronary atherosclerosis    DM2 (diabetes mellitus, type 2) (HCC)    Elevated PSA    GERD (gastroesophageal reflux disease)    Hydronephrosis    on left   Hyperlipidemia    Hypertension    Iliac artery aneurysm (HCC)    Kidney stone    Myasthenia gravis (HCC) 03/29/2017   Ocular myasthenia gravis  (HCC)    Ophthalmoplegia 2009   Diplopia-intranuclear   Osteoarthritis    Pulmonary embolism (HCC)       PAST SURGICAL HISTORY: Past Surgical History:  Procedure Laterality Date   KNEE SURGERY     LITHOTRIPSY     PROSTATE SURGERY      FAMILY HISTORY:  Family History  Problem Relation Age of Onset   Heart attack Father    Sarcoidosis Brother     SOCIAL HISTORY:  Social History   Socioeconomic History   Marital status: Married    Spouse name: Not on file   Number of children: 3   Years of education: 12   Highest education level: Not on file  Occupational History   Occupation: Self employed  Tobacco Use   Smoking status: Former   Smokeless tobacco: Former    Types: Chew   Tobacco comments:    Quit about 30 years ago  Advertising account planner   Vaping status: Never Used  Substance and Sexual Activity   Alcohol use: No   Drug use: No   Sexual activity: Not on file  Other Topics Concern   Not on file  Social History Narrative   Lives w/ wife   Caffeine use: Coffee ocass   Social Determinants of Health   Financial Resource Strain: Not on file  Food Insecurity: Not on file  Transportation Needs: Not on file  Physical Activity: Not on file  Stress: Not on file  Social Connections: Not on file  Intimate Partner Violence: Not on file    ALLERGIES: Aspirin  MEDICATIONS:  Current Outpatient Medications  Medication Sig Dispense Refill   albuterol (VENTOLIN HFA) 108 (90 Base) MCG/ACT inhaler Inhale 2 puffs into the lungs every 6 (six) hours as needed for wheezing.     amLODipine (NORVASC) 10 MG tablet Take 1 tablet (10 mg total) by mouth daily. 30 tablet 3   Ascorbic Acid (VITAMIN C PO) Take 1 tablet by mouth See admin instructions. Take one tablet by mouth with iron (ferrous sulfate) - occasionally     FERROUS SULFATE PO Take 1 tablet by mouth See admin instructions. Take one tablet by mouth with vitamin c - occasionally     pantoprazole (PROTONIX) 40 MG tablet Take 1  tablet (40 mg total) by mouth 2 (two) times daily before a meal. 60 tablet 0   polyethylene glycol (MIRALAX / GLYCOLAX) 17 g packet Take 17 g by mouth 2 (two) times daily. 14 each 0   pravastatin (PRAVACHOL) 20 MG tablet Take 20 mg by mouth every morning.     Tetrahydrozoline HCl (VISINE OP) Place 1 drop into both eyes daily as needed (dry eyes).     TRELEGY ELLIPTA 100-62.5-25 MCG/INH AEPB Inhale 1 puff into the lungs daily as needed (shortness of breath/wheezing).     XARELTO 20 MG TABS tablet Take 20 mg by mouth every morning.     No current facility-administered medications for this encounter.    REVIEW OF SYSTEMS:  On review of systems, the patient reports that he is doing well overall. He denies any chest pain, shortness of breath, cough, fevers, chills, night sweats, unintended weight changes. He denies any bowel disturbances, and denies abdominal pain, nausea or vomiting. He denies any new musculoskeletal or joint aches or pains. His IPSS was ***, indicating *** urinary symptoms. His SHIM was ***, indicating he {does not have/has mild/moderate/severe} erectile dysfunction. A complete review of systems is obtained and is otherwise negative.    PHYSICAL EXAM:  Wt Readings from Last 3 Encounters:  07/19/21 163 lb 2 oz (74 kg)  12/31/18 179 lb (81.2 kg)  10/01/18 180 lb (81.6 kg)   Temp Readings from Last 3 Encounters:  11/28/21 97.6 F (36.4 C) (Oral)  11/23/21 98.2 F (36.8 C) (Oral)  06/02/20 98.2 F (36.8 C) (Tympanic)   BP Readings from Last 3 Encounters:  11/28/21 134/70  11/23/21 (!) 171/82  07/19/21 (!) 160/68   Pulse Readings from Last 3 Encounters:  11/28/21 88  11/23/21 62  07/19/21 60    /10  In general this is a well appearing *** male in no acute distress. He's alert and oriented x4 and appropriate throughout the examination. Cardiopulmonary assessment is negative for acute distress, and he exhibits normal effort.     KPS = ***  100 - Normal; no  complaints; no evidence of disease. 90   - Able to carry on normal activity; minor signs or symptoms of disease. 80   - Normal activity with effort; some signs or symptoms of disease. 10   - Cares for self; unable to carry on normal activity or to do active work. 60   - Requires occasional assistance, but is able to care for most of his personal needs. 50   - Requires considerable assistance and frequent medical care. 40   - Disabled; requires special care and assistance. 30   - Severely disabled; hospital admission is indicated although death not imminent.  20   - Very sick; hospital admission necessary; active supportive treatment necessary. 10   - Moribund; fatal processes progressing rapidly. 0     - Dead  Karnofsky DA, Abelmann WH, Craver LS and Burchenal [facility] [phone]) The use of the nitrogen mustards in the palliative treatment of carcinoma: with particular reference to bronchogenic carcinoma Cancer 1 634-56  LABORATORY DATA:  Lab Results  Component Value Date   WBC 6.8 11/27/2021   HGB 12.1 (L) 11/27/2021   HCT 36.7 (L) 11/27/2021   MCV 90.6 11/27/2021   PLT 233 11/27/2021   Lab Results  Component Value Date   NA 134 (L) 11/28/2021   K 4.1 11/28/2021   CL 99 11/28/2021   CO2 28 11/28/2021   Lab Results  Component Value Date   ALT 12 11/27/2021   AST 17 11/27/2021   ALKPHOS 89 11/27/2021   BILITOT 1.0 11/27/2021     RADIOGRAPHY: No results found.    IMPRESSION/PLAN: 1. 89 y.o. gentleman with rising PSA of 18.4 and PI-RADS 5 lesion on 02/2022 MRI. We discussed the patient's workup and outlined the nature of prostate cancer. He has not had prostate biopsy at this time. The patient's MRI findings and PSA put him into the *** risk group. ***Accordingly, he is eligible for a variety of potential treatment options including {ADT concurrent with} brachytherapy, 5.5-8 weeks of external radiation, {5 weeks of external radiation with an upfront brachytherapy boost,} or prostatectomy.  We discussed the available radiation techniques, and focused on the details and logistics of delivery. {The patient may not be an ideal candidate for brachytherapy/boost with a prostate volume of *** prior to downsizing from hormone therapy. We discussed that based on his prostate volume, he would require beginning treatment with a 5 alpha reductase inhibitor +/- ADT for at least 3 months to allow for downsizing of the prostate prior to initiating brachytherapy.} We discussed and outlined the risks, benefits, short and long-term effects associated with radiotherapy and compared and contrasted these with prostatectomy. We discussed the role of SpaceOAR gel in reducing the rectal toxicity associated with radiotherapy. {We also detailed the role of ADT in the treatment of *** risk prostate cancer and outlined the associated side effects that could be expected with this therapy.}  He appears to have a good understanding of his disease and our treatment recommendations which are of curative intent.  He was encouraged to ask questions that were answered to his stated satisfaction.  At the conclusion of our conversation, the patient is interested in moving forward with ***.  We personally spent *** minutes in this encounter including chart review, reviewing radiological studies, meeting face-to-face with the patient, entering orders and completing documentation.    Marguarite Arbour, PA-C    Margaretmary Dys, MD  [facility]  Radiation Oncology Direct Dial: [phone]  Fax: [phone] [email or web address]  Skype  [email or web address]   This document serves as a record of services personally performed by Margaretmary Dys, MD and Marcello Fennel, PA-C. It was created on their behalf by Mickie Bail, a trained medical scribe. The creation of this record is based on the scribe's personal observations and the provider's statements to them. This document has been checked and approved by the attending provider.

## 2023-10-29 ENCOUNTER — Encounter: Payer: Self-pay | Admitting: Radiation Oncology

## 2023-10-29 ENCOUNTER — Ambulatory Visit
Admission: RE | Admit: 2023-10-29 | Discharge: 2023-10-29 | Disposition: A | Discharge status: Home / Self Care | Payer: Medicare Other | Source: Ambulatory Visit | Attending: Radiation Oncology | Admitting: Radiation Oncology

## 2023-10-29 VITALS — BP 143/67 | HR 65 | Temp 97.5°F | Resp 18 | Ht 71.0 in | Wt 165.2 lb

## 2023-10-29 DIAGNOSIS — E119 Type 2 diabetes mellitus without complications: Secondary | ICD-10-CM | POA: Diagnosis not present

## 2023-10-29 DIAGNOSIS — I700 Atherosclerosis of aorta: Secondary | ICD-10-CM | POA: Diagnosis not present

## 2023-10-29 DIAGNOSIS — C61 Malignant neoplasm of prostate: Secondary | ICD-10-CM | POA: Insufficient documentation

## 2023-10-29 DIAGNOSIS — K219 Gastro-esophageal reflux disease without esophagitis: Secondary | ICD-10-CM | POA: Insufficient documentation

## 2023-10-29 DIAGNOSIS — R972 Elevated prostate specific antigen [PSA]: Secondary | ICD-10-CM | POA: Insufficient documentation

## 2023-10-29 DIAGNOSIS — Z7984 Long term (current) use of oral hypoglycemic drugs: Secondary | ICD-10-CM | POA: Insufficient documentation

## 2023-10-29 DIAGNOSIS — E785 Hyperlipidemia, unspecified: Secondary | ICD-10-CM | POA: Diagnosis not present

## 2023-10-29 DIAGNOSIS — Z791 Long term (current) use of non-steroidal anti-inflammatories (NSAID): Secondary | ICD-10-CM | POA: Insufficient documentation

## 2023-10-29 DIAGNOSIS — Z86711 Personal history of pulmonary embolism: Secondary | ICD-10-CM | POA: Diagnosis not present

## 2023-10-29 DIAGNOSIS — Z79899 Other long term (current) drug therapy: Secondary | ICD-10-CM | POA: Diagnosis not present

## 2023-10-29 DIAGNOSIS — I2510 Atherosclerotic heart disease of native coronary artery without angina pectoris: Secondary | ICD-10-CM | POA: Diagnosis not present

## 2023-10-29 DIAGNOSIS — Z7901 Long term (current) use of anticoagulants: Secondary | ICD-10-CM | POA: Insufficient documentation

## 2023-10-29 DIAGNOSIS — G7000 Myasthenia gravis without (acute) exacerbation: Secondary | ICD-10-CM | POA: Diagnosis not present

## 2023-10-29 DIAGNOSIS — M1990 Unspecified osteoarthritis, unspecified site: Secondary | ICD-10-CM | POA: Insufficient documentation

## 2023-10-29 DIAGNOSIS — I129 Hypertensive chronic kidney disease with stage 1 through stage 4 chronic kidney disease, or unspecified chronic kidney disease: Secondary | ICD-10-CM | POA: Insufficient documentation

## 2023-10-29 DIAGNOSIS — N182 Chronic kidney disease, stage 2 (mild): Secondary | ICD-10-CM | POA: Insufficient documentation

## 2023-10-29 DIAGNOSIS — Z87891 Personal history of nicotine dependence: Secondary | ICD-10-CM | POA: Diagnosis not present

## 2023-10-29 HISTORY — DX: Elevated prostate specific antigen (PSA): R97.20

## 2023-10-29 NOTE — Progress Notes (Signed)
Introduced myself to the patient,and his wife, as the prostate nurse navigator.  No barriers to care identified at this time.  He is here to discuss his radiation treatment options. Patient and his wife are going to discuss recommendation of proceeding with prostate biopsy.  I informed patient and his wife that I would happily communicate with urology to ensure coordination of care. I gave him my business card and asked him to call me with questions or concerns.  Verbalized understanding.

## 2023-10-31 ENCOUNTER — Ambulatory Visit (INDEPENDENT_AMBULATORY_CARE_PROVIDER_SITE_OTHER): Payer: Medicare Other | Admitting: Podiatry

## 2023-10-31 ENCOUNTER — Encounter: Payer: Self-pay | Admitting: Podiatry

## 2023-10-31 VITALS — Ht 71.0 in | Wt 165.2 lb

## 2023-10-31 DIAGNOSIS — L309 Dermatitis, unspecified: Secondary | ICD-10-CM | POA: Diagnosis not present

## 2023-10-31 DIAGNOSIS — M79675 Pain in left toe(s): Secondary | ICD-10-CM

## 2023-10-31 DIAGNOSIS — B351 Tinea unguium: Secondary | ICD-10-CM | POA: Diagnosis not present

## 2023-10-31 DIAGNOSIS — M79674 Pain in right toe(s): Secondary | ICD-10-CM

## 2023-10-31 NOTE — Progress Notes (Signed)
Subjective:   Patient ID: [name]., male   DOB: 89 y.o.   MRN: [id number]   HPI Patient presents with caregiver with thick yellow brittle nailbeds 1-5 both feet that become painful and he cannot cut them becoming increasing issue for him.  Patient does not smoke and still tries to stay active   Review of Systems  All other systems reviewed and are negative.       Objective:  Physical Exam Vitals and nursing note reviewed.  Constitutional:      Appearance: He is well-developed.  Pulmonary:     Effort: Pulmonary effort is normal.  Musculoskeletal:        General: Normal range of motion.  Skin:    General: Skin is warm.  Neurological:     Mental Status: He is alert.     Neurovascular status was found to be intact muscle strength is found to be adequate range of motion is adequate with thick yellow brittle nailbeds 1-5 both feet that become painful make it hard to wear shoe gear with any degree of.  Good digital perfusion well-oriented     Assessment:  Chronic mycotic nail infection with pain 1-5 both feet     Plan:  H&P reviewed debridement nailbeds 1-5 both feet Neutra genic bleeding reappoint routine care

## 2023-11-06 DIAGNOSIS — H52203 Unspecified astigmatism, bilateral: Secondary | ICD-10-CM | POA: Diagnosis not present

## 2023-11-06 DIAGNOSIS — H532 Diplopia: Secondary | ICD-10-CM | POA: Diagnosis not present

## 2023-11-06 DIAGNOSIS — E119 Type 2 diabetes mellitus without complications: Secondary | ICD-10-CM | POA: Diagnosis not present

## 2023-11-08 NOTE — Progress Notes (Signed)
RN spoke with patient to follow up after recent radiation oncology consult.  Patient would like to proceed with prostate biopsy.  Patient aware this will be communicated to his urologist, Dr. Lafonda Mosses.

## 2023-11-11 NOTE — Progress Notes (Signed)
Voicemail left with biopsy scheduler at [facility] to follow up on status of biopsy.

## 2023-11-14 NOTE — Progress Notes (Signed)
[facility] is working on obtaining clearance to move forward with prostate biopsy.  RN updated patient, and he is aware.

## 2023-11-22 NOTE — Progress Notes (Signed)
Patient is scheduled for his prostate biopsy on 12/19 with Dr. Lafonda Mosses.   Will follow up after biopsy to review results, and recommendations.

## 2023-12-12 DIAGNOSIS — C61 Malignant neoplasm of prostate: Secondary | ICD-10-CM | POA: Diagnosis not present

## 2023-12-26 DIAGNOSIS — R972 Elevated prostate specific antigen [PSA]: Secondary | ICD-10-CM | POA: Diagnosis not present

## 2023-12-26 DIAGNOSIS — C61 Malignant neoplasm of prostate: Secondary | ICD-10-CM | POA: Diagnosis not present

## 2023-12-27 NOTE — Progress Notes (Signed)
 Patient saw Dr. Lovie on 1/2 to review biopsy results.  Per MD recommendations they will obtain PSMA PET scan for staging and refer back to rad onc to review treatment recommendations.  RN will follow to ensure once PSMA PET is scheduled patient gets back in for consult.

## 2023-12-30 ENCOUNTER — Other Ambulatory Visit (HOSPITAL_COMMUNITY): Payer: Self-pay | Admitting: Urology

## 2023-12-30 DIAGNOSIS — C61 Malignant neoplasm of prostate: Secondary | ICD-10-CM

## 2024-01-06 ENCOUNTER — Encounter (HOSPITAL_COMMUNITY)
Admission: RE | Admit: 2024-01-06 | Discharge: 2024-01-06 | Disposition: A | Discharge status: Home / Self Care | Payer: Medicare Other | Source: Ambulatory Visit | Attending: Urology | Admitting: Urology

## 2024-01-06 DIAGNOSIS — C61 Malignant neoplasm of prostate: Secondary | ICD-10-CM | POA: Diagnosis not present

## 2024-01-06 MED ORDER — FLOTUFOLASTAT F 18 GALLIUM 296-5846 MBQ/ML IV SOLN
8.0000 | Freq: Once | INTRAVENOUS | Status: AC
  Administered 2024-01-06: 8.28 via INTRAVENOUS
  Filled 2024-01-06: qty 8

## 2024-01-08 NOTE — Progress Notes (Signed)
GU Location of Tumor / Histology: Prostate Ca  If Prostate Cancer, Gleason Score is (4 + 5) and PSA is (18.40 on 10/07/2023)  Biopsies     01/06/2024  Dr. Traci Sermon NM PET (PSMA) Skull to Mid Thigh CLINICAL DATA:  Prostate carcinoma.   IMPRESSION: 1. Focal radiotracer activity in the LEFT lobe of the prostate gland consistent with primary prostate adenocarcinoma. 2. No evidence of metastatic adenopathy in the pelvis or periaortic retroperitoneum. 3. No evidence of visceral metastasis or skeletal metastasis. 4. LEFT inguinal hernia contains a loop of small bowel without evidence of obstruction. 5.  Aortic Atherosclerosis (ICD10-I70.0).  Past/Anticipated interventions by urology, if any: NA  Past/Anticipated interventions by medical oncology, if any:  NA  Weight changes, if any:  No, maintaining right now.  IPSS:  10 SHIM:  No sexual activity.  Bowel/Bladder complaints, if any:  No  Nausea/Vomiting, if any:  No  Pain issues, if any:   0/10  SAFETY ISSUES: Prior radiation?  No Pacemaker/ICD?  No Possible current pregnancy? Male Is the patient on methotrexate?  No  Current Complaints / other details:

## 2024-01-13 NOTE — Progress Notes (Signed)
Request placed for PSMA PET read prior to upcoming consult.

## 2024-01-14 ENCOUNTER — Ambulatory Visit
Admission: RE | Admit: 2024-01-14 | Discharge: 2024-01-14 | Disposition: A | Discharge status: Home / Self Care | Payer: Medicare Other | Source: Ambulatory Visit | Attending: Radiation Oncology | Admitting: Radiation Oncology

## 2024-01-14 ENCOUNTER — Encounter: Payer: Self-pay | Admitting: Radiation Oncology

## 2024-01-14 ENCOUNTER — Telehealth: Payer: Self-pay | Admitting: *Deleted

## 2024-01-14 VITALS — BP 154/73 | HR 68 | Temp 97.3°F | Resp 18 | Ht 71.0 in | Wt 168.2 lb

## 2024-01-14 DIAGNOSIS — E1122 Type 2 diabetes mellitus with diabetic chronic kidney disease: Secondary | ICD-10-CM | POA: Diagnosis not present

## 2024-01-14 DIAGNOSIS — C61 Malignant neoplasm of prostate: Secondary | ICD-10-CM | POA: Insufficient documentation

## 2024-01-14 DIAGNOSIS — N182 Chronic kidney disease, stage 2 (mild): Secondary | ICD-10-CM | POA: Diagnosis not present

## 2024-01-14 DIAGNOSIS — Z7984 Long term (current) use of oral hypoglycemic drugs: Secondary | ICD-10-CM | POA: Insufficient documentation

## 2024-01-14 DIAGNOSIS — K4090 Unilateral inguinal hernia, without obstruction or gangrene, not specified as recurrent: Secondary | ICD-10-CM | POA: Diagnosis not present

## 2024-01-14 DIAGNOSIS — K219 Gastro-esophageal reflux disease without esophagitis: Secondary | ICD-10-CM | POA: Diagnosis not present

## 2024-01-14 DIAGNOSIS — Z7901 Long term (current) use of anticoagulants: Secondary | ICD-10-CM | POA: Insufficient documentation

## 2024-01-14 DIAGNOSIS — Z87891 Personal history of nicotine dependence: Secondary | ICD-10-CM | POA: Insufficient documentation

## 2024-01-14 DIAGNOSIS — N281 Cyst of kidney, acquired: Secondary | ICD-10-CM | POA: Diagnosis not present

## 2024-01-14 DIAGNOSIS — R972 Elevated prostate specific antigen [PSA]: Secondary | ICD-10-CM | POA: Diagnosis not present

## 2024-01-14 DIAGNOSIS — E785 Hyperlipidemia, unspecified: Secondary | ICD-10-CM | POA: Insufficient documentation

## 2024-01-14 DIAGNOSIS — I700 Atherosclerosis of aorta: Secondary | ICD-10-CM | POA: Insufficient documentation

## 2024-01-14 DIAGNOSIS — I2510 Atherosclerotic heart disease of native coronary artery without angina pectoris: Secondary | ICD-10-CM | POA: Insufficient documentation

## 2024-01-14 DIAGNOSIS — Z79899 Other long term (current) drug therapy: Secondary | ICD-10-CM | POA: Insufficient documentation

## 2024-01-14 DIAGNOSIS — M1990 Unspecified osteoarthritis, unspecified site: Secondary | ICD-10-CM | POA: Insufficient documentation

## 2024-01-14 DIAGNOSIS — G7000 Myasthenia gravis without (acute) exacerbation: Secondary | ICD-10-CM | POA: Insufficient documentation

## 2024-01-14 DIAGNOSIS — Z86711 Personal history of pulmonary embolism: Secondary | ICD-10-CM | POA: Diagnosis not present

## 2024-01-14 DIAGNOSIS — I129 Hypertensive chronic kidney disease with stage 1 through stage 4 chronic kidney disease, or unspecified chronic kidney disease: Secondary | ICD-10-CM | POA: Insufficient documentation

## 2024-01-14 DIAGNOSIS — Z191 Hormone sensitive malignancy status: Secondary | ICD-10-CM | POA: Diagnosis not present

## 2024-01-14 NOTE — Telephone Encounter (Signed)
CALLED PATIENT TO INFORM OF ADT APPT. FOR 01-15-24- ARRIVAL TIME- 9:30 AM @ DR. MACHEN'S OFFICE, SPOKE WITH PATIENT AND HE IS AWARE OF THIS APPT.

## 2024-01-14 NOTE — Progress Notes (Signed)
Radiation Oncology         [phone] ________________________________  Follow Up Visit  Name: [name]. MRN: [id number]  Date: 01/14/2024  DOB: [birth year]  WU:[id number], [name], MD  [name], MD   REFERRING PHYSICIAN: [name], MD  DIAGNOSIS: 89 y.o. gentleman with Stage T1c adenocarcinoma of the prostate with Gleason score of 4+5, and PSA of 18.4.    ICD-10-CM   1. Malignant neoplasm of prostate (HCC)  C61        HISTORY OF PRESENT ILLNESS: [name]. is a 89 y.o. male with a history of BPH and elevated PSA. He had a prior suprapubic partial prostatectomy for BPH in 2000 and left ureteroscopy with laser lithotripsy in 04/2011 under the care of Dr. Darvin Neighbours. More recently, he was noted to have an elevated PSA of 9.6 by his primary care physician, Dr. Jacky Kindle.  Accordingly, he was referred for evaluation in urology by Dr. Lafonda Mosses on 11/20/21. He returned for follow up in 01/2022, and his PSA had risen to 11.7. This prompted a prostate MRI on 03/13/22 showing a 1.6 cm PI-RADS 5 lesion within the left posterior base to mid gland in the peripheral zone without evidence of locally advanced or pelvic metastatic disease. Given his advanced age, he and his wife preferred to continue with PSA surveillance. Since that time, his PSA has continued to rise, at 13.1 in 09/2022, 15.8 in 03/2023 and most recent value from 10/08/23 was 18.4. We met the patient in 10/2023 but at that time, he had not undergone prostate biopsy.  Since we last saw him, he elected to proceed with prostate biopsy on 12/12/23. The prostate volume measured 21.5 cc. Out of 12 core biopsies, 8 were positive.  The maximum Gleason score was 4+5, and this was seen in all six left-sided cores. Additionally, small foci of Gleason 3+3 were seen in the right apex and right apex lateral.  He underwent staging PSMA PET scan on 01/06/24 showing no evidence of disease outside of the prostate.  The patient is here  today to review the prostate biopsy and imaging results and further discuss his potential radiation treatment options.   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   Osteoarthritis    Pulmonary embolism (HCC)       PAST SURGICAL HISTORY: Past Surgical History:  Procedure Laterality Date   KNEE SURGERY     LITHOTRIPSY     PROSTATE BIOPSY     PROSTATE SURGERY      FAMILY HISTORY:  Family History  Problem Relation Age of Onset   Heart attack Father    Sarcoidosis Brother     SOCIAL HISTORY:  Social History   Socioeconomic History   Marital status: Married    Spouse name: Not on file   Number of children: 3   Years of education: 12   Highest education level:  Not on file  Occupational History   Occupation: Self employed  Tobacco Use   Smoking status: Former    Types: Cigarettes   Smokeless tobacco: Former    Types: Chew   Tobacco comments:    Quit about 30 years ago  Vaping Use   Vaping status: Never Used  Substance and Sexual Activity   Alcohol use: No   Drug use: No   Sexual activity: Not Currently  Other Topics Concern   Not on file  Social History Narrative   Lives w/ wife   Caffeine use: Coffee ocass   Social Drivers of Corporate investment banker Strain: Not on file  Food Insecurity: No Food Insecurity (01/14/2024)   Hunger Vital Sign    Worried About Running Out of Food in the Last Year: Never true    Ran Out of Food in the Last Year: Never true   Transportation Needs: No Transportation Needs (01/14/2024)   PRAPARE - Administrator, Civil Service (Medical): No    Lack of Transportation (Non-Medical): No  Physical Activity: Not on file  Stress: Not on file  Social Connections: Not on file  Intimate Partner Violence: Not At Risk (01/14/2024)   Humiliation, Afraid, Rape, and Kick questionnaire    Fear of Current or Ex-Partner: No    Emotionally Abused: No    Physically Abused: No    Sexually Abused: No    ALLERGIES: Aspirin and Nsaids  MEDICATIONS:  Current Outpatient Medications  Medication Sig Dispense Refill   albuterol (VENTOLIN HFA) 108 (90 Base) MCG/ACT inhaler Inhale 2 puffs into the lungs every 6 (six) hours as needed for wheezing.     amLODipine (NORVASC) 10 MG tablet Take 1 tablet (10 mg total) by mouth daily. 30 tablet 3   Ascorbic Acid (VITAMIN C PO) Take 1 tablet by mouth See admin instructions. Take one tablet by mouth with iron (ferrous sulfate) - occasionally     Blood Glucose Monitoring Suppl (ONETOUCH VERIO FLEX SYSTEM) w/Device KIT Use to test BS twice daily     FERROUS SULFATE PO Take 1 tablet by mouth See admin instructions. Take one tablet by mouth with vitamin c - occasionally     glucose blood (ONETOUCH VERIO) test strip 1 strip In Vitro Twice a day     metFORMIN (GLUCOPHAGE-XR) 500 MG 24 hr tablet Take 500 mg by mouth 2 (two) times daily.     pantoprazole (PROTONIX) 40 MG tablet Take 1 tablet (40 mg total) by mouth 2 (two) times daily before a meal. 60 tablet 0   polyethylene glycol (MIRALAX / GLYCOLAX) 17 g packet Take 17 g by mouth 2 (two) times daily. 14 each 0   potassium chloride (KLOR-CON) 10 MEQ tablet Take 10 mEq by mouth daily.     pravastatin (PRAVACHOL) 20 MG tablet Take 20 mg by mouth every morning.     Tetrahydrozoline HCl (VISINE OP) Place 1 drop into both eyes daily as needed (dry eyes).     TRELEGY ELLIPTA 100-62.5-25 MCG/INH AEPB Inhale 1 puff into the lungs daily as needed  (shortness of breath/wheezing).     XARELTO 20 MG TABS tablet Take 20 mg by mouth every morning.     No current facility-administered medications for this encounter.    REVIEW OF SYSTEMS:  On review of systems, the patient reports that he is doing well overall. He denies any chest pain, shortness of breath, cough, fevers, chills, night sweats, unintended weight changes. He denies any  bowel disturbances, and denies abdominal pain, nausea or vomiting. He denies any new musculoskeletal or joint aches or pains. His IPSS was 3, indicating mild urinary symptoms. His SHIM was 22, indicating he does not have erectile dysfunction.  A complete review of systems is obtained and is otherwise negative.    PHYSICAL EXAM:  Wt Readings from Last 3 Encounters:  01/14/24 168 lb 4 oz (76.3 kg)  10/31/23 165 lb 4 oz (75 kg)  10/29/23 165 lb 4 oz (75 kg)   Temp Readings from Last 3 Encounters:  01/14/24 (!) 97.3 F (36.3 C) (Temporal)  10/29/23 (!) 97.5 F (36.4 C) (Temporal)  11/28/21 97.6 F (36.4 C) (Oral)   BP Readings from Last 3 Encounters:  01/14/24 (!) 154/73  10/29/23 (!) 143/67  11/28/21 134/70   Pulse Readings from Last 3 Encounters:  01/14/24 68  10/29/23 65  11/28/21 88   Pain Assessment Pain Score: 0-No pain/10  In general this is a well appearing African American male in no acute distress. He's alert and oriented x4 and appropriate throughout the examination. Cardiopulmonary assessment is negative for acute distress, and he exhibits normal effort.     KPS = 100  100 - Normal; no complaints; no evidence of disease. 90   - Able to carry on normal activity; minor signs or symptoms of disease. 80   - Normal activity with effort; some signs or symptoms of disease. 15   - Cares for self; unable to carry on normal activity or to do active work. 60   - Requires occasional assistance, but is able to care for most of his personal needs. 50   - Requires considerable assistance and  frequent medical care. 40   - Disabled; requires special care and assistance. 30   - Severely disabled; hospital admission is indicated although death not imminent. 20   - Very sick; hospital admission necessary; active supportive treatment necessary. 10   - Moribund; fatal processes progressing rapidly. 0     - Dead  Karnofsky DA, Abelmann WH, Craver LS and Burchenal [facility] [phone]) The use of the nitrogen mustards in the palliative treatment of carcinoma: with particular reference to bronchogenic carcinoma Cancer 1 634-56  LABORATORY DATA:  Lab Results  Component Value Date   WBC 6.8 11/27/2021   HGB 12.1 (L) 11/27/2021   HCT 36.7 (L) 11/27/2021   MCV 90.6 11/27/2021   PLT 233 11/27/2021   Lab Results  Component Value Date   NA 134 (L) 11/28/2021   K 4.1 11/28/2021   CL 99 11/28/2021   CO2 28 11/28/2021   Lab Results  Component Value Date   ALT 12 11/27/2021   AST 17 11/27/2021   ALKPHOS 89 11/27/2021   BILITOT 1.0 11/27/2021     RADIOGRAPHY: NM PET (PSMA) SKULL TO MID THIGH Result Date: 01/13/2024 CLINICAL DATA:  Prostate carcinoma. EXAM: NUCLEAR MEDICINE PET SKULL BASE TO THIGH TECHNIQUE: 8.3 mCi Flotufolastat (Posluma) was injected intravenously. Full-ring PET imaging was performed from the skull base to thigh after the radiotracer. CT data was obtained and used for attenuation correction and anatomic localization. COMPARISON:  None Available. FINDINGS: NECK No radiotracer activity in neck lymph nodes. Incidental CT finding: None. CHEST No radiotracer accumulation within mediastinal or hilar lymph nodes. No suspicious pulmonary nodules on the CT scan. Incidental CT finding: None. ABDOMEN/PELVIS Prostate: Focus of scratch the small focus of intense radiotracer activity in the LEFT lobe of the prostate gland with SUV max equal 15.1 on image  178c. Lymph nodes: No abnormal radiotracer accumulation within pelvic or abdominal nodes. Liver: No evidence of liver metastasis. Incidental CT  finding: Loop of small bowel enters a LEFT inguinal hernia. No evidence obstruction (image 175/series 4) Atherosclerotic calcification of the aorta. Bilateral simple fluid attenuation renal cysts. SKELETON No focal activity to suggest skeletal metastasis. IMPRESSION: 1. Focal radiotracer activity in the LEFT lobe of the prostate gland consistent with primary prostate adenocarcinoma. 2. No evidence of metastatic adenopathy in the pelvis or periaortic retroperitoneum. 3. No evidence of visceral metastasis or skeletal metastasis. 4. LEFT inguinal hernia contains a loop of small bowel without evidence of obstruction. 5.  Aortic Atherosclerosis (ICD10-I70.0). Electronically Signed   By: Genevive Bi M.D.   On: 01/13/2024 11:15      IMPRESSION/PLAN: 1. 89 y.o. gentleman with Stage T1c adenocarcinoma of the prostate with Gleason score of 4+5, and PSA of 18.4. Today, we discussed the patient's workup and outlines the nature of prostate cancer in this setting. The patient's T stage, Gleason's score, and PSA put him into the high risk group. Accordingly, he is eligible for a variety of potential treatment options including prostatectomy or LT-ADT in combination with either 8 weeks of external radiation or 5 weeks of external radiation followed by a brachytherapy boost. We discussed the available radiation techniques, and focused on the details and logistics and delivery. He is not an ideal candidate for brachytherapy given his small gland size prior to ADT.  He is also not a good surgical candidate due to his advanced age.  Therefore, we discussed and outlined the risks, benefits, short and long-term effects associated with daily external beam radiotherapy and compared and contrasted these with prostatectomy. We discussed the role of SpaceOAR in reducing the rectal toxicity associated with radiotherapy. We also detailed the role of ADT in the treatment of high prostate cancer and outlined the associated side effects  that could be expected with this therapy.  At the end of the conversation the patient is interested in moving forward with 8 weeks of external beam radiation concurrent with LT-ADT. We will share our discussion with Dr. Lafonda Mosses so that he can get the ADT started, first available and will also make arrangements for fiducial markers +/- SpaceOAR gel placement in March/April 2025, prior to simulation, in anticipation of beginning the daily IMRT approximately 2 months after the start of ADT. We enjoyed meeting with him and his wife again today and look forward to continuing to participate in his care.  2. Left inguinal hernia. He reports that he has had a reducible, left inguinal hernia for several years but feels that it is gradually enlarging. He denies associated pain but is interested in getting a surgical opinion regarding laparoscopic hernia repair at his age. I will share this with Dr. Lafonda Mosses for further discussion.   We personally spent 60 minutes in this encounter including chart review, reviewing radiological studies, meeting face-to-face with the patient, entering orders and completing documentation.    Marguarite Arbour, PA-C    Margaretmary Dys, MD  [facility]  Radiation Oncology Direct Dial: [phone]  Fax: [phone] [email or web address]  Skype  [email or web address]   This document serves as a record of services personally performed by Margaretmary Dys, MD and Marcello Fennel, PA-C. It was created on their behalf by Mickie Bail, a trained medical scribe. The creation of this record is based on the scribe's personal observations and the provider's statements to them. This document has been checked and approved by the attending  provider.

## 2024-01-15 DIAGNOSIS — C61 Malignant neoplasm of prostate: Secondary | ICD-10-CM | POA: Diagnosis not present

## 2024-01-15 DIAGNOSIS — R972 Elevated prostate specific antigen [PSA]: Secondary | ICD-10-CM | POA: Diagnosis not present

## 2024-01-22 DIAGNOSIS — E1129 Type 2 diabetes mellitus with other diabetic kidney complication: Secondary | ICD-10-CM | POA: Diagnosis not present

## 2024-01-22 DIAGNOSIS — D6869 Other thrombophilia: Secondary | ICD-10-CM | POA: Diagnosis not present

## 2024-01-22 DIAGNOSIS — I129 Hypertensive chronic kidney disease with stage 1 through stage 4 chronic kidney disease, or unspecified chronic kidney disease: Secondary | ICD-10-CM | POA: Diagnosis not present

## 2024-01-22 DIAGNOSIS — G609 Hereditary and idiopathic neuropathy, unspecified: Secondary | ICD-10-CM | POA: Diagnosis not present

## 2024-01-22 DIAGNOSIS — N1832 Chronic kidney disease, stage 3b: Secondary | ICD-10-CM | POA: Diagnosis not present

## 2024-01-23 DIAGNOSIS — C61 Malignant neoplasm of prostate: Secondary | ICD-10-CM | POA: Diagnosis not present

## 2024-01-23 NOTE — Progress Notes (Signed)
Patient received Orgovyx in office dispense from [facility] today, and will start medication today.    Plan of care in progress.

## 2024-02-25 ENCOUNTER — Other Ambulatory Visit: Payer: Self-pay | Admitting: Urology

## 2024-02-28 ENCOUNTER — Ambulatory Visit: Payer: Medicare Other | Admitting: Podiatry

## 2024-02-28 ENCOUNTER — Encounter: Payer: Self-pay | Admitting: Podiatry

## 2024-02-28 DIAGNOSIS — M79674 Pain in right toe(s): Secondary | ICD-10-CM | POA: Diagnosis not present

## 2024-02-28 DIAGNOSIS — B351 Tinea unguium: Secondary | ICD-10-CM | POA: Diagnosis not present

## 2024-02-28 DIAGNOSIS — M79675 Pain in left toe(s): Secondary | ICD-10-CM

## 2024-03-02 ENCOUNTER — Telehealth: Payer: Self-pay

## 2024-03-02 NOTE — Telephone Encounter (Signed)
 RN returned call to [name] who had questions about appointment 04/21/2024 for husband [name].  She wanted to make sure appointment location and wanted to know what was going to happen and if there was anything they need to do to prepare for this up-coming appointment.  RN answered questions and [name] was appreciative of the response.  Nothing more needed at this time.

## 2024-03-03 NOTE — Progress Notes (Signed)
 Subjective:   Patient ID: Thomas Crandall., male   DOB: 89 y.o.   MRN: [id number]   HPI Patient presents with elongated nailbeds 1-5 both feet that are thick painful and he cannot cut   ROS      Objective:  Physical Exam  Neurovascular status intact thick yellow brittle nailbeds 1-5 both feet painful when pressed     Assessment:  Mycotic nail infection with pain 1-5 both feet     Plan:  Debride nailbeds 1-5 both feet Neutra genic bleeding reappoint routine care

## 2024-03-05 NOTE — Progress Notes (Signed)
 RN spoke with patient and wife to review next steps with fiducial's, spaceOAR, and CT Simulation.   All questions answered.  No additional needs at this time.

## 2024-04-07 DIAGNOSIS — C61 Malignant neoplasm of prostate: Secondary | ICD-10-CM | POA: Diagnosis not present

## 2024-04-07 DIAGNOSIS — R8271 Bacteriuria: Secondary | ICD-10-CM | POA: Diagnosis not present

## 2024-04-08 ENCOUNTER — Other Ambulatory Visit: Payer: Self-pay | Admitting: Urology

## 2024-04-08 ENCOUNTER — Encounter (HOSPITAL_COMMUNITY): Payer: Self-pay | Admitting: Urology

## 2024-04-08 DIAGNOSIS — C61 Malignant neoplasm of prostate: Secondary | ICD-10-CM

## 2024-04-08 NOTE — Progress Notes (Signed)
 Spoke w/ via phone for pre-op interview--- [place] and wife Dietrich Fragmin Lab needs dos----    EKG, BMP, A1C and CBG per anesthesia      Lab results------ COVID test -----patient states asymptomatic no test needed Arrive at -------1000 NPO after MN NO Solid Food.   Pre-Surgery Ensure or G2:  Med rec completed Medications to take morning of surgery ----- Norvasc Diabetic medication -----NONE AM of surgery.  GLP1 agonist last dose: GLP1 instructions:  Patient instructed no nail polish to be worn day of surgery Patient instructed to bring photo id and insurance card day of surgery Patient aware to have Driver (ride ) / caregiver    for 24 hours after surgery - Wife Edwinna Grammes Patient Special Instructions ----- Shower with antibacterial soap. Fleets enema per surgeon instructions, pt and wife verbalized understanding. Pre-Op special Instructions -----  Per Dr Delorise Few instructions pt to hold Xarelto for 3 days prior to surgery. Pt and wife verbalized understanding.  Patient verbalized understanding of instructions that were given at this phone interview. Patient denies chest pain, sob, fever, cough at the interview.

## 2024-04-13 NOTE — H&P (Signed)
 CC/HPI: [name] presents today for pre-operative history and physical exam in anticipation of fiducial markers and space oar placement by Dr. Judi Nottingham on 04/17/24. He is doing well and is without complaint.   Cardiology cleared to stop ASA 5 days and Xarelto  3 days pre-op.    [name] denies F/C, HA, CP, SOB, N/V, diarrhea/constipation, back pain, flank pain, hematuria, and dysuria.    HX:   1. elevated PSA - trending up over last several years. MRI with PIRADS 5 lesion last year. recent PSA 18.4. 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 PSMA PET shows no evidence of metastasis   Mr. Selders has no acute complaints today. He met with radiation oncology again last week and is interested in moving forward with radiation.     ALLERGIES: Aspirin TABS - heart burn Contrast Dye - Nausea    MEDICATIONS: Albuterol  Sulfate  amLODIPine  Besylate 10 MG Tablet 1 tablet PO Daily  Ferrous Sulfate  Meloxicam 15 MG Tablet 1 tablet PO Daily  metFORMIN HCl ER 500 MG Tablet Extended Release 24 Hour 1 tablet PO Daily  OneTouch Verio Strip 1 PO Daily  Orgovyx 120 MG Tablet 1 tablet PO Daily  Pantoprazole  Sodium 40 MG Tablet Delayed Release 1 tablet PO Daily  Polyethylene Glycol  Potassium Chloride ER 10 MEQ Tablet Extended Release 1 tablet PO Daily  Pravastatin  Sodium 20 MG Tablet  Trelegy Ellipta  Visine  Vitamin C  Xarelto  20 MG Tablet     GU PSH: Cysto Uretero Lithotripsy - 2012 Cystoscopy Insert Stent - 2012 ESWL - 2009 Prostate Needle Biopsy - 12/12/2023       PSH Notes: Cystoscopy With Ureteroscopy With Lithotripsy, Cystoscopy With Insertion Of Ureteral Stent Left, Lithotripsy, Prostate Surgery (TURP)   NON-GU PSH: Surgical Pathology, Gross And Microscopic Examination For Prostate Needle - 12/12/2023 Visit Complexity (formerly GPC1X) - 10/14/2023     GU  PMH: Prostate Cancer - 01/23/2024, - 01/15/2024, - 12/26/2023 Elevated PSA - 01/15/2024, - 12/26/2023, - 12/12/2023, - 10/14/2023, - 04/18/2023, - 10/18/2022, - 2023, - 2023, - 11/20/2021 History of urolithiasis - 11/20/2021, Nephrolithiasis, - 2014 BPH w/o LUTS, Benign prostatic hypertrophy without lower urinary tract symptoms - 2014 Hydronephrosis Unspec, Hydronephrosis On The Left - 2014 Obstructive and reflux uropathy, Unspec, Obstructive uropathy - 2014 Renal calculus, Kidney stone on left side - 2014 Renal cyst, Renal cysts, acquired, bilateral - 2014 Ureteral calculus, Proximal Ureteral Stone On The Left - 2014, Distal Ureteral Stone On The Left, - 2014      PMH Notes:  2011-06-07 09:23:40 - Note: Normal Routine History And Physical Senior Citizen (65-80)   NON-GU PMH: Personal history of other endocrine, nutritional and metabolic disease, History of diabetes mellitus - 2014 Arthritis GERD    FAMILY HISTORY: 3 Son's - Son Congestive Heart Failure - Father Family Health Status - Father alive at age 63 - Runs In Family, Runs In Family Family Health Status - Mother's Age - Runs In Family Family Health Status Number - Runs In Family Prostate Cancer - Brother   SOCIAL HISTORY: Marital Status: Married Current Smoking Status: Patient does not smoke anymore.   Tobacco Use Assessment Completed: Used Tobacco in last 30 days? Does not use smokeless tobacco. Has never drank.  Does not use drugs. Drinks 2 caffeinated drinks per day. Has not had a blood transfusion.  Notes: Previous History Of Smoking, Tobacco Use, Caffeine Use, Marital History - Currently Married, Alcohol  Use, Occupation:   REVIEW OF SYSTEMS:    GU Review Male:   Patient denies frequent urination, hard to postpone urination, burning/ pain with urination, get up at night to urinate, leakage of urine, stream starts and stops, trouble starting your stream, have to strain to urinate , erection problems, and penile pain.   Gastrointestinal (Upper):   Patient denies nausea, vomiting, and indigestion/ heartburn.  Gastrointestinal (Lower):   Patient denies diarrhea and constipation.  Constitutional:   Patient denies fever, night sweats, weight loss, and fatigue.  Skin:   Patient denies skin rash/ lesion and itching.  Eyes:   Patient denies blurred vision and double vision.  Ears/ Nose/ Throat:   Patient denies sore throat and sinus problems.  Hematologic/Lymphatic:   Patient denies swollen glands and easy bruising.  Cardiovascular:   Patient denies leg swelling and chest pains.  Respiratory:   Patient denies cough and shortness of breath.  Endocrine:   Patient denies excessive thirst.  Musculoskeletal:   Patient denies back pain and joint pain.  Neurological:   Patient denies headaches and dizziness.  Psychologic:   Patient denies depression and anxiety.   VITAL SIGNS:      04/07/2024 02:01 PM  Weight 170 lb / 77.11 kg  Height 71 in / 180.34 cm  BP 114/46 mmHg  Pulse 74 /min  Temperature 97.4 F / 36.3 C  BMI 23.7 kg/m   MULTI-SYSTEM PHYSICAL EXAMINATION:    Constitutional: Well-nourished. No physical deformities. Normally developed. Good grooming.  Neck: Neck symmetrical, not swollen. Normal tracheal position.  Respiratory: Normal breath sounds. No labored breathing, no use of accessory muscles.   Cardiovascular: Regular rate and rhythm. No murmur, no gallop.   Lymphatic: No enlargement of neck, axillae, groin.  Skin: No paleness, no jaundice, no cyanosis. No lesion, no ulcer, no rash.  Neurologic / Psychiatric: Oriented to time, oriented to place, oriented to person. No depression, no anxiety, no agitation.  Gastrointestinal: No mass, no tenderness, no rigidity, non obese abdomen.  Eyes: Normal conjunctivae. Normal eyelids.  Ears, Nose, Mouth, and Throat: Left ear no scars, no lesions, no masses. Right ear no scars, no lesions, no masses. Nose no scars, no lesions, no masses. Normal hearing. Normal  lips.  Musculoskeletal: Normal gait and station of head and neck.     Complexity of Data:  Records Review:   Previous Patient Records  Urine Test Review:   Urinalysis   04/07/24  Urinalysis  Urine Appearance Clear   Urine Color Yellow   Urine Glucose Neg mg/dL  Urine Bilirubin Neg mg/dL  Urine Ketones Neg mg/dL  Urine Specific Gravity 1.025   Urine Blood Neg ery/uL  Urine pH <=5.0   Urine Protein 2+ mg/dL  Urine Urobilinogen 0.2 mg/dL  Urine Nitrites Neg   Urine Leukocyte Esterase Neg leu/uL  Urine WBC/hpf NS (Not Seen)   Urine RBC/hpf NS (Not Seen)   Urine Epithelial Cells 0 - 5/hpf   Urine Bacteria Rare (0-9/hpf)   Urine Mucous Present   Urine Yeast NS (Not Seen)   Urine Trichomonas Not Present   Urine Cystals NS (Not Seen)   Urine Casts Hyaline   Urine Sperm Not Present    PROCEDURES:          Urinalysis w/Scope - 81001 Dipstick Dipstick Cont'd Micro  Color: Yellow Bilirubin: Neg mg/dL WBC/hpf: NS (Not Seen)  Appearance: Clear Ketones: Neg mg/dL RBC/hpf: NS (  Not Seen)  Specific Gravity: 1.025 Blood: Neg ery/uL Bacteria: Rare (0-9/hpf)  pH: <=5.0 Protein: 2+ mg/dL Cystals: NS (Not Seen)  Glucose: Neg mg/dL Urobilinogen: 0.2 mg/dL Casts: Hyaline    Nitrites: Neg Trichomonas: Not Present    Leukocyte Esterase: Neg leu/uL Mucous: Present      Epithelial Cells: 0 - 5/hpf      Yeast: NS (Not Seen)      Sperm: Not Present    ASSESSMENT:      ICD-10 Details  1 GU:   Prostate Cancer - C61    PLAN:           Orders Labs Urine Culture          Schedule Return Visit/Planned Activity: Keep Scheduled Appointment - Schedule Surgery          Document Letter(s):  Created for Patient: Clinical Summary         Notes:   There are no changes in the patients history or physical exam since last evaluation by Dr. Jarvis Mesa. [name] is scheduled to undergo fiducial marker and space oar placement on 04/17/24.   Urine for culture.   All [name]'s questions were answered to the best of  my ability.

## 2024-04-16 NOTE — Progress Notes (Signed)
 Spoke with wife, Dietrich Fragmin, to change arrival time to 9 AM, no liquids after 8 AM. Verbalized understanding.

## 2024-04-16 NOTE — Progress Notes (Signed)
 RN reached out to patient to review next steps and assess any new questions prior to upcoming fiducial's and spaceOAR gel.   Patient unavailable but I did speak with wife and reviewed next steps and provided education related to radiation.  All questions answered, no additional needs at this time.  They have my direct contact and I encouraged them to call with any questions or concerns.

## 2024-04-17 ENCOUNTER — Ambulatory Visit (HOSPITAL_COMMUNITY)

## 2024-04-17 ENCOUNTER — Encounter (HOSPITAL_COMMUNITY)
Admission: RE | Disposition: A | Discharge status: Home / Self Care | Payer: Self-pay | Source: Home / Self Care | Attending: Urology

## 2024-04-17 ENCOUNTER — Encounter (HOSPITAL_COMMUNITY): Payer: Self-pay | Admitting: Urology

## 2024-04-17 ENCOUNTER — Ambulatory Visit (HOSPITAL_COMMUNITY)
Admission: RE | Admit: 2024-04-17 | Discharge: 2024-04-17 | Disposition: A | Discharge status: Home / Self Care | Attending: Urology | Admitting: Urology

## 2024-04-17 DIAGNOSIS — Z7902 Long term (current) use of antithrombotics/antiplatelets: Secondary | ICD-10-CM | POA: Insufficient documentation

## 2024-04-17 DIAGNOSIS — I2510 Atherosclerotic heart disease of native coronary artery without angina pectoris: Secondary | ICD-10-CM | POA: Diagnosis not present

## 2024-04-17 DIAGNOSIS — Z87891 Personal history of nicotine dependence: Secondary | ICD-10-CM | POA: Diagnosis not present

## 2024-04-17 DIAGNOSIS — Z7984 Long term (current) use of oral hypoglycemic drugs: Secondary | ICD-10-CM | POA: Insufficient documentation

## 2024-04-17 DIAGNOSIS — C61 Malignant neoplasm of prostate: Secondary | ICD-10-CM

## 2024-04-17 DIAGNOSIS — G7000 Myasthenia gravis without (acute) exacerbation: Secondary | ICD-10-CM | POA: Insufficient documentation

## 2024-04-17 DIAGNOSIS — E119 Type 2 diabetes mellitus without complications: Secondary | ICD-10-CM | POA: Insufficient documentation

## 2024-04-17 DIAGNOSIS — Z79899 Other long term (current) drug therapy: Secondary | ICD-10-CM | POA: Insufficient documentation

## 2024-04-17 DIAGNOSIS — K219 Gastro-esophageal reflux disease without esophagitis: Secondary | ICD-10-CM | POA: Insufficient documentation

## 2024-04-17 DIAGNOSIS — I10 Essential (primary) hypertension: Secondary | ICD-10-CM | POA: Diagnosis not present

## 2024-04-17 HISTORY — PX: SPACE OAR INSTILLATION: SHX6769

## 2024-04-17 HISTORY — PX: GOLD SEED IMPLANT: SHX6343

## 2024-04-17 LAB — BASIC METABOLIC PANEL WITH GFR
Anion gap: 8 (ref 5–15)
BUN: 24 mg/dL — ABNORMAL HIGH (ref 8–23)
CO2: 23 mmol/L (ref 22–32)
Calcium: 9.8 mg/dL (ref 8.9–10.3)
Chloride: 106 mmol/L (ref 98–111)
Creatinine, Ser: 1.79 mg/dL — ABNORMAL HIGH (ref 0.61–1.24)
GFR, Estimated: 36 mL/min — ABNORMAL LOW (ref >60)
Glucose, Bld: 101 mg/dL — ABNORMAL HIGH (ref 70–99)
Potassium: 5.1 mmol/L (ref 3.5–5.1)
Sodium: 137 mmol/L (ref 135–145)

## 2024-04-17 LAB — GLUCOSE, CAPILLARY
Glucose-Capillary: 90 mg/dL (ref 70–99)
Glucose-Capillary: 87 mg/dL (ref 70–99)

## 2024-04-17 LAB — HEMOGLOBIN A1C
Hgb A1c MFr Bld: 6.1 % — ABNORMAL HIGH (ref 4.8–5.6)
Mean Plasma Glucose: 128.37 mg/dL

## 2024-04-17 SURGERY — INSERTION, GOLD SEEDS
Anesthesia: Monitor Anesthesia Care | Site: Prostate

## 2024-04-17 MED ORDER — LIDOCAINE HCL 1 % IJ SOLN
INTRAMUSCULAR | Status: AC
  Filled 2024-04-17: qty 40

## 2024-04-17 MED ORDER — LIDOCAINE 2% (20 MG/ML) 5 ML SYRINGE
INTRAMUSCULAR | Status: DC | PRN
  Administered 2024-04-17: 60 mg via INTRAVENOUS

## 2024-04-17 MED ORDER — SODIUM CHLORIDE 0.9 % IV SOLN: INTRAVENOUS | Status: DC

## 2024-04-17 MED ORDER — PROPOFOL 10 MG/ML IV BOLUS
INTRAVENOUS | Status: DC | PRN
  Administered 2024-04-17: 40 mg via INTRAVENOUS

## 2024-04-17 MED ORDER — ORAL CARE MOUTH RINSE: 15.0000 mL | Freq: Once | OROMUCOSAL | Status: AC

## 2024-04-17 MED ORDER — CEFAZOLIN SODIUM-DEXTROSE 2-4 GM/100ML-% IV SOLN
INTRAVENOUS | Status: AC
  Filled 2024-04-17: qty 100

## 2024-04-17 MED ORDER — PROPOFOL 500 MG/50ML IV EMUL
INTRAVENOUS | Status: DC | PRN
  Administered 2024-04-17: 100 ug/kg/min via INTRAVENOUS

## 2024-04-17 MED ORDER — SODIUM CHLORIDE (PF) 0.9 % IJ SOLN
INTRAMUSCULAR | Status: AC
  Filled 2024-04-17: qty 40

## 2024-04-17 MED ORDER — FENTANYL CITRATE (PF) 250 MCG/5ML IJ SOLN
INTRAMUSCULAR | Status: DC | PRN
  Administered 2024-04-17: 25 ug via INTRAVENOUS

## 2024-04-17 MED ORDER — CEFAZOLIN SODIUM-DEXTROSE 2-4 GM/100ML-% IV SOLN
2.0000 g | INTRAVENOUS | Status: AC
  Administered 2024-04-17: 2 g via INTRAVENOUS

## 2024-04-17 MED ORDER — LIDOCAINE HCL (PF) 1 % IJ SOLN
INTRAMUSCULAR | Status: DC | PRN
  Administered 2024-04-17: 10 mL

## 2024-04-17 MED ORDER — FENTANYL CITRATE (PF) 250 MCG/5ML IJ SOLN
INTRAMUSCULAR | Status: AC
  Filled 2024-04-17: qty 5

## 2024-04-17 MED ORDER — DEXAMETHASONE SODIUM PHOSPHATE 10 MG/ML IJ SOLN
INTRAMUSCULAR | Status: DC | PRN
  Administered 2024-04-17: 5 mg via INTRAVENOUS

## 2024-04-17 MED ORDER — SODIUM CHLORIDE (PF) 0.9 % IJ SOLN
INTRAMUSCULAR | Status: DC | PRN
  Administered 2024-04-17: 10 mL

## 2024-04-17 MED ORDER — ONDANSETRON HCL 4 MG/2ML IJ SOLN
INTRAMUSCULAR | Status: DC | PRN
  Administered 2024-04-17: 4 mg via INTRAVENOUS

## 2024-04-17 MED ORDER — CHLORHEXIDINE GLUCONATE 0.12 % MT SOLN
15.0000 mL | Freq: Once | OROMUCOSAL | Status: AC
  Administered 2024-04-17: 15 mL via OROMUCOSAL
  Filled 2024-04-17: qty 15

## 2024-04-17 SURGICAL SUPPLY — 20 items
BLADE CLIPPER SENSICLIP SURGIC (BLADE) ×1 IMPLANT
CNTNR URN SCR LID CUP LEK RST (MISCELLANEOUS) ×1 IMPLANT
COVER BACK TABLE 60X90IN (DRAPES) ×1 IMPLANT
DRSG TEGADERM 4X4.75 (GAUZE/BANDAGES/DRESSINGS) ×1 IMPLANT
DRSG TEGADERM 8X12 (GAUZE/BANDAGES/DRESSINGS) ×1 IMPLANT
GAUZE SPONGE 4X4 12PLY STRL (GAUZE/BANDAGES/DRESSINGS) ×1 IMPLANT
GLOVE BIO SURGEON STRL SZ 6.5 (GLOVE) ×1 IMPLANT
IMPL SPACEOAR SYSTEM 10ML (Spacer) ×1 IMPLANT
MARKER GOLD PRELOAD 1.2X3 (Urological Implant) ×1 IMPLANT
MARKER SKIN DUAL TIP RULER LAB (MISCELLANEOUS) ×1 IMPLANT
NDL SPNL 22GX3.5 QUINCKE BK (NEEDLE) ×1 IMPLANT
NEEDLE SPNL 22GX3.5 QUINCKE BK (NEEDLE) ×1 IMPLANT
SHEATH ULTRASOUND LF (SHEATH) IMPLANT
SHEATH ULTRASOUND LTX NONSTRL (SHEATH) IMPLANT
SLEEVE SCD COMPRESS KNEE MED (STOCKING) ×1 IMPLANT
SURGILUBE 2OZ TUBE FLIPTOP (MISCELLANEOUS) ×1 IMPLANT
SYR 10ML LL (SYRINGE) IMPLANT
SYR CONTROL 10ML LL (SYRINGE) ×1 IMPLANT
TOWEL OR 17X24 6PK STRL BLUE (TOWEL DISPOSABLE) ×1 IMPLANT
UNDERPAD 30X36 HEAVY ABSORB (UNDERPADS AND DIAPERS) ×1 IMPLANT

## 2024-04-17 NOTE — Discharge Instructions (Addendum)
You should avoid strenuous activities today but may resume all normal activities tomorrow.  2.   You can take Tylenol as needed for any pain or discomfort.  3.    Follow up with your radiation oncologist for your simulation appointment as scheduled.  If this is not currently scheduled or you do not know the date/time for that appointment, please contact the radiation oncology office to confirm.    Post Anesthesia Home Care Instructions  Activity: Get plenty of rest for the remainder of the day. A responsible individual must stay with you for 24 hours following the procedure.  For the next 24 hours, DO NOT: -Drive a car -Operate machinery -Drink alcoholic beverages -Take any medication unless instructed by your physician -Make any legal decisions or sign important papers.  Meals: Start with liquid foods such as gelatin or soup. Progress to regular foods as tolerated. Avoid greasy, spicy, heavy foods. If nausea and/or vomiting occur, drink only clear liquids until the nausea and/or vomiting subsides. Call your physician if vomiting continues.  Special Instructions/Symptoms: Your throat may feel dry or sore from the anesthesia or the breathing tube placed in your throat during surgery. If this causes discomfort, gargle with warm salt water. The discomfort should disappear within 24 hours.  

## 2024-04-17 NOTE — Transfer of Care (Signed)
 Immediate Anesthesia Transfer of Care Note  Patient: [name].  Procedure(s) Performed: INSERTION, GOLD SEEDS (Prostate) INJECTION, HYDROGEL SPACER (Prostate)  Patient Location: PACU  Anesthesia Type:MAC  Level of Consciousness: drowsy  Airway & Oxygen Therapy: Patient Spontanous Breathing and Patient connected to face mask oxygen  Post-op Assessment: Report given to RN and Post -op Vital signs reviewed and stable  Post vital signs: Reviewed and stable  Last Vitals:  Vitals Value Taken Time  BP    Temp    Pulse    Resp    SpO2      Last Pain:  Vitals:   04/17/24 0951  TempSrc: Oral  PainSc: 0-No pain      Patients Stated Pain Goal: 3 (04/17/24 0951)  Complications: No notable events documented.

## 2024-04-17 NOTE — Interval H&P Note (Signed)
 History and Physical Interval Note:  04/17/2024 11:21 AM  Thomas Maha.  has presented today for surgery, with the diagnosis of PROSTATE CANCER.  The various methods of treatment have been discussed with the patient and family. After consideration of risks, benefits and other options for treatment, the patient has consented to  Procedure(s): INSERTION, GOLD SEEDS (N/A) INJECTION, HYDROGEL SPACER (N/A) as a surgical intervention.  The patient's history has been reviewed, patient examined, no change in status, stable for surgery.  I have reviewed the patient's chart and labs.  Questions were answered to the patient's satisfaction.     [name]

## 2024-04-17 NOTE — Anesthesia Preprocedure Evaluation (Addendum)
 Anesthesia Evaluation  Patient identified by MRN, date of birth, ID band Patient awake    Reviewed: Allergy & Precautions, NPO status , Patient's Chart, lab work & pertinent test results  Airway Mallampati: II  TM Distance: >3 FB Neck ROM: Full    Dental  (+) Edentulous Upper, Edentulous Lower, Lower Dentures, Upper Dentures, Dental Advisory Given   Pulmonary former smoker, PE   Pulmonary exam normal breath sounds clear to auscultation       Cardiovascular hypertension, Pt. 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 negative neurological ROS  negative psych ROS   GI/Hepatic Neg liver ROS,GERD  ,,  Endo/Other  diabetes, Type 2, Oral Hypoglycemic Agents    Renal/GU Renal InsufficiencyRenal diseaseLab Results      Component                Value               Date                      NA                       137                 04/17/2024                CL                       106                 04/17/2024                K                        5.1                 04/17/2024                CO2                      23                  04/17/2024                BUN                      24 (H)              04/17/2024                CREATININE               1.79 (H)            04/17/2024                GFRNONAA                 36 (L)              04/17/2024                CALCIUM                  9.8  04/17/2024                ALBUMIN                  3.3 (L)             11/27/2021                GLUCOSE                  101 (H)             04/17/2024             negative genitourinary   Musculoskeletal  (+) Arthritis ,    Abdominal   Peds  Hematology  (+) Blood dyscrasia (xarelto  LD 04/11/24)   Anesthesia Other Findings   Reproductive/Obstetrics                              Anesthesia Physical Anesthesia Plan  ASA: 3  Anesthesia Plan: MAC   Post-op Pain Management: Tylenol  PO (pre-op)*   Induction: Intravenous  PONV Risk Score and Plan: 1 and Propofol  infusion, Treatment may vary due to age or medical condition and Ondansetron   Airway Management Planned: Natural Airway  Additional Equipment:   Intra-op Plan:   Post-operative Plan:   Informed Consent: I have reviewed the patients History and Physical, chart, labs and discussed the procedure including the risks, benefits and alternatives for the proposed anesthesia with the patient or authorized representative who has indicated his/her understanding and acceptance.     Dental advisory given  Plan Discussed with: CRNA  Anesthesia Plan Comments:        Anesthesia Quick Evaluation

## 2024-04-17 NOTE — Progress Notes (Signed)
 PACU NURSING NOTE Arrived into PACU post op of Dr. 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 HR will vary from NSR 64/min to sudden SB of 54/min.  Client denies SOB or chest discomfort, strong radial pulses noted, S1S2 easily noted LSB. 12 lead ECG appears more normal, Leads II, III and AVF appear normal, MDA notified to evaluate pt. MDA evaluated ECG, and patient. No new orders rec, per MDA states may proceed with MDs post op orders.

## 2024-04-17 NOTE — Op Note (Signed)
 Preoperative diagnosis: prostate cancer  Postoperative diagnosis: prostate cancer  Procedure: 1) Placement of fiducial markers into prostate                    2) Insertion of SpaceOAR hydrogel   Surgeon: Perley Bradley, MD   Anesthesia: General  EBL: Minimal  Complications: None  Indication: Thomas Gillespiel is a 89 y.o. gentleman with clinically localized prostate cancer. After discussing management options for treatment, he elected to proceed with radiotherapy. He presents today for the above procedures. The potential risks, complications, alternative options, and expected recovery course have been discussed in detail with the patient and he has provided informed consent to proceed.  Description of procedure: The patient was administered preoperative antibiotics, placed in the dorsal lithotomy position, and prepped and draped in the usual sterile fashion. Next, transrectal ultrasonography was utilized to visualize the prostate. Three gold fiducial markers were then placed into the prostate via transperineal needles under ultrasound guidance at the right apex, right base, and left mid gland under direct ultrasound guidance. A site in the midline was then selected on the perineum for placement of an 18 g needle with saline. The needle was advanced above the rectum and below Denonvillier's fascia to the mid gland and confirmed to be in the midline on transverse imaging. One cc of saline was injected confirming appropriate expansion of this space. A total of 5 cc of saline was then injected to open the space further bilaterally. The saline syringe was then removed and the SpaceOAR hydrogel was injected with good distribution bilaterally. He tolerated the procedure well and without complications. He was given a voiding trial prior to discharge from the PACU.

## 2024-04-20 ENCOUNTER — Telehealth: Payer: Self-pay | Admitting: *Deleted

## 2024-04-20 ENCOUNTER — Encounter (HOSPITAL_COMMUNITY): Payer: Self-pay | Admitting: Urology

## 2024-04-20 DIAGNOSIS — I2699 Other pulmonary embolism without acute cor pulmonale: Secondary | ICD-10-CM | POA: Diagnosis not present

## 2024-04-20 DIAGNOSIS — N1832 Chronic kidney disease, stage 3b: Secondary | ICD-10-CM | POA: Diagnosis not present

## 2024-04-20 DIAGNOSIS — D6869 Other thrombophilia: Secondary | ICD-10-CM | POA: Diagnosis not present

## 2024-04-20 DIAGNOSIS — E1129 Type 2 diabetes mellitus with other diabetic kidney complication: Secondary | ICD-10-CM | POA: Diagnosis not present

## 2024-04-20 DIAGNOSIS — R102 Pelvic and perineal pain: Secondary | ICD-10-CM | POA: Diagnosis not present

## 2024-04-20 DIAGNOSIS — E785 Hyperlipidemia, unspecified: Secondary | ICD-10-CM | POA: Diagnosis not present

## 2024-04-20 DIAGNOSIS — I700 Atherosclerosis of aorta: Secondary | ICD-10-CM | POA: Diagnosis not present

## 2024-04-20 DIAGNOSIS — I129 Hypertensive chronic kidney disease with stage 1 through stage 4 chronic kidney disease, or unspecified chronic kidney disease: Secondary | ICD-10-CM | POA: Diagnosis not present

## 2024-04-20 NOTE — Progress Notes (Signed)
 Patient was evaluated by urology this afternoon due to rectal discomfort.    Recommendations for tylenol , sitz bath, and donut seat cushion given by MD.  Patient is scheduled for CT Sim/Pelvis MRI tomorrow, 4/29.

## 2024-04-20 NOTE — Telephone Encounter (Signed)
 CALLED PATIENT TO REMIND OF SIM FOR 04-21-24- ARRIVAL TIME- 9:45 AM @ [facility], INFORMED PATIENT TO ARRIVE WITH A FULL BLADDER, REMINDED PATIENT OF MRI FOR 04-21-24- ARRIVAL TIME- 11:30 AM, NO RESTRICTIONS TO SCAN, SPOKE WITH PATIENT AND HE IS AWARE OF THESE APPTS. AND THE INSTRUCTIONS

## 2024-04-20 NOTE — Progress Notes (Signed)
 RN received call from patient's wife with concerns with rectal discomfort.  RN spoke with wife, patient reports discomfort with sitting and severe discomfort if he coughs.  Denies any bleeding.    Patient had fiducial marker's and spaceOAR gel placement on 4/25.   RN encouraged patient's wife to reach out to urology to have them triage due to recent procedure.  RN provided direct contact for triage.

## 2024-04-20 NOTE — Anesthesia Postprocedure Evaluation (Signed)
 Anesthesia Post Note  Patient: [name].  Procedure(s) Performed: INSERTION, GOLD SEEDS (Prostate) INJECTION, HYDROGEL SPACER (Prostate)     Patient location during evaluation: PACU Anesthesia Type: MAC Level of consciousness: awake Pain management: pain level controlled Vital Signs Assessment: post-procedure vital signs reviewed and stable Respiratory status: spontaneous breathing, nonlabored ventilation and respiratory function stable Cardiovascular status: blood pressure returned to baseline and stable Postop Assessment: no apparent nausea or vomiting Anesthetic complications: no   No notable events documented.  Last Vitals:  Vitals:   04/17/24 1233 04/17/24 1400  BP: 118/83   Pulse: (!) 58   Resp: 12   Temp: 36.6 C 36.7 C  SpO2: 100%     Last Pain:  Vitals:   04/17/24 1400  TempSrc:   PainSc: 0-No pain                 [name] P [name]

## 2024-04-21 ENCOUNTER — Ambulatory Visit (HOSPITAL_COMMUNITY)
Admission: RE | Admit: 2024-04-21 | Discharge: 2024-04-21 | Disposition: A | Discharge status: Home / Self Care | Source: Ambulatory Visit | Attending: Urology | Admitting: Urology

## 2024-04-21 ENCOUNTER — Ambulatory Visit
Admission: RE | Admit: 2024-04-21 | Discharge: 2024-04-21 | Disposition: A | Discharge status: Home / Self Care | Source: Ambulatory Visit | Attending: Urology | Admitting: Urology

## 2024-04-21 DIAGNOSIS — C61 Malignant neoplasm of prostate: Secondary | ICD-10-CM | POA: Insufficient documentation

## 2024-04-21 DIAGNOSIS — R972 Elevated prostate specific antigen [PSA]: Secondary | ICD-10-CM | POA: Diagnosis not present

## 2024-04-21 DIAGNOSIS — Z191 Hormone sensitive malignancy status: Secondary | ICD-10-CM | POA: Diagnosis not present

## 2024-04-21 NOTE — Progress Notes (Signed)
  Radiation Oncology         [phone]) [phone] ________________________________  Name: [name]. MRN: [id number]  Date: 04/21/2024  DOB: [birth year]  SIMULATION AND TREATMENT PLANNING NOTE    ICD-10-CM   1. Malignant neoplasm of prostate (HCC)  C61       DIAGNOSIS:  89 y.o. gentleman with Stage T1c adenocarcinoma of the prostate with Gleason score of 4+5, and PSA of 18.4.   NARRATIVE:  The patient was brought to the CT Simulation planning suite.  Identity was confirmed.  All relevant records and images related to the planned course of therapy were reviewed.  The patient freely provided informed written consent to proceed with treatment after reviewing the details related to the planned course of therapy. The consent form was witnessed and verified by the simulation staff.  Then, the patient was set-up in a stable reproducible supine position for radiation therapy.  A vacuum lock pillow device was custom fabricated to position his legs in a reproducible immobilized position.  Then, I performed a urethrogram under sterile conditions to identify the prostatic apex.  CT images were obtained.  Surface markings were placed.  The CT images were loaded into the planning software.  Then the prostate target and avoidance structures including the rectum, bladder, bowel and hips were contoured.  Treatment planning then occurred.  The radiation prescription was entered and confirmed.  A total of one complex treatment devices was fabricated. I have requested : Intensity Modulated Radiotherapy (IMRT) is medically necessary for this case for the following reason:  Rectal sparing.Aaron Aas  PLAN:   The prostate, seminal vesicles, and pelvic lymph nodes will initially be treated to 45 Gy in 25 fractions of 1.8 Gy followed by a boost to the prostate only, to 75 Gy with 15 additional fractions of 2.0 Gy; concurrent with LT-ADT.  ________________________________  Trilby Fujisawa Lorri Rota, M.D.

## 2024-04-27 ENCOUNTER — Ambulatory Visit
Admission: RE | Admit: 2024-04-27 | Discharge: 2024-04-27 | Disposition: A | Discharge status: Home / Self Care | Source: Ambulatory Visit | Attending: Radiation Oncology | Admitting: Radiation Oncology

## 2024-04-27 DIAGNOSIS — Z191 Hormone sensitive malignancy status: Secondary | ICD-10-CM | POA: Diagnosis not present

## 2024-04-27 DIAGNOSIS — R972 Elevated prostate specific antigen [PSA]: Secondary | ICD-10-CM | POA: Diagnosis not present

## 2024-04-27 DIAGNOSIS — Z510 Encounter for antineoplastic radiation therapy: Secondary | ICD-10-CM | POA: Insufficient documentation

## 2024-04-27 DIAGNOSIS — C61 Malignant neoplasm of prostate: Secondary | ICD-10-CM | POA: Diagnosis not present

## 2024-04-30 ENCOUNTER — Other Ambulatory Visit: Payer: Self-pay

## 2024-04-30 ENCOUNTER — Ambulatory Visit

## 2024-04-30 DIAGNOSIS — R972 Elevated prostate specific antigen [PSA]: Secondary | ICD-10-CM | POA: Diagnosis not present

## 2024-04-30 DIAGNOSIS — Z510 Encounter for antineoplastic radiation therapy: Secondary | ICD-10-CM | POA: Diagnosis not present

## 2024-04-30 DIAGNOSIS — Z191 Hormone sensitive malignancy status: Secondary | ICD-10-CM | POA: Diagnosis not present

## 2024-04-30 DIAGNOSIS — C61 Malignant neoplasm of prostate: Secondary | ICD-10-CM | POA: Diagnosis not present

## 2024-04-30 LAB — RAD ONC ARIA SESSION SUMMARY
Course Elapsed Days: 0
Plan Fractions Treated to Date: 1
Plan Prescribed Dose Per Fraction: 1.8 Gy
Plan Total Fractions Prescribed: 25
Plan Total Prescribed Dose: 45 Gy
Reference Point Dosage Given to Date: 1.8 Gy
Reference Point Session Dosage Given: 1.8 Gy
Session Number: 1

## 2024-05-01 ENCOUNTER — Ambulatory Visit
Admission: RE | Admit: 2024-05-01 | Discharge: 2024-05-01 | Disposition: A | Discharge status: Home / Self Care | Source: Ambulatory Visit | Attending: Radiation Oncology | Admitting: Radiation Oncology

## 2024-05-01 ENCOUNTER — Other Ambulatory Visit: Payer: Self-pay

## 2024-05-01 DIAGNOSIS — R972 Elevated prostate specific antigen [PSA]: Secondary | ICD-10-CM | POA: Diagnosis not present

## 2024-05-01 DIAGNOSIS — Z510 Encounter for antineoplastic radiation therapy: Secondary | ICD-10-CM | POA: Diagnosis not present

## 2024-05-01 DIAGNOSIS — Z191 Hormone sensitive malignancy status: Secondary | ICD-10-CM | POA: Diagnosis not present

## 2024-05-01 DIAGNOSIS — C61 Malignant neoplasm of prostate: Secondary | ICD-10-CM | POA: Diagnosis not present

## 2024-05-01 LAB — RAD ONC ARIA SESSION SUMMARY
Course Elapsed Days: 1
Plan Fractions Treated to Date: 2
Plan Prescribed Dose Per Fraction: 1.8 Gy
Plan Total Fractions Prescribed: 25
Plan Total Prescribed Dose: 45 Gy
Reference Point Dosage Given to Date: 3.6 Gy
Reference Point Session Dosage Given: 1.8 Gy
Session Number: 2

## 2024-05-04 ENCOUNTER — Other Ambulatory Visit: Payer: Self-pay

## 2024-05-04 ENCOUNTER — Ambulatory Visit
Admission: RE | Admit: 2024-05-04 | Discharge: 2024-05-04 | Disposition: A | Discharge status: Home / Self Care | Source: Ambulatory Visit | Attending: Radiation Oncology | Admitting: Radiation Oncology

## 2024-05-04 DIAGNOSIS — Z510 Encounter for antineoplastic radiation therapy: Secondary | ICD-10-CM | POA: Diagnosis not present

## 2024-05-04 DIAGNOSIS — C61 Malignant neoplasm of prostate: Secondary | ICD-10-CM | POA: Diagnosis not present

## 2024-05-04 DIAGNOSIS — R972 Elevated prostate specific antigen [PSA]: Secondary | ICD-10-CM | POA: Diagnosis not present

## 2024-05-04 DIAGNOSIS — Z191 Hormone sensitive malignancy status: Secondary | ICD-10-CM | POA: Diagnosis not present

## 2024-05-04 LAB — RAD ONC ARIA SESSION SUMMARY
Course Elapsed Days: 4
Plan Fractions Treated to Date: 3
Plan Prescribed Dose Per Fraction: 1.8 Gy
Plan Total Fractions Prescribed: 25
Plan Total Prescribed Dose: 45 Gy
Reference Point Dosage Given to Date: 5.4 Gy
Reference Point Session Dosage Given: 1.8 Gy
Session Number: 3

## 2024-05-05 ENCOUNTER — Ambulatory Visit
Admission: RE | Admit: 2024-05-05 | Discharge: 2024-05-05 | Disposition: A | Discharge status: Home / Self Care | Source: Ambulatory Visit | Attending: Radiation Oncology | Admitting: Radiation Oncology

## 2024-05-05 ENCOUNTER — Other Ambulatory Visit: Payer: Self-pay

## 2024-05-05 DIAGNOSIS — R972 Elevated prostate specific antigen [PSA]: Secondary | ICD-10-CM | POA: Diagnosis not present

## 2024-05-05 DIAGNOSIS — Z510 Encounter for antineoplastic radiation therapy: Secondary | ICD-10-CM | POA: Diagnosis not present

## 2024-05-05 DIAGNOSIS — Z191 Hormone sensitive malignancy status: Secondary | ICD-10-CM | POA: Diagnosis not present

## 2024-05-05 DIAGNOSIS — C61 Malignant neoplasm of prostate: Secondary | ICD-10-CM | POA: Diagnosis not present

## 2024-05-05 LAB — RAD ONC ARIA SESSION SUMMARY
Course Elapsed Days: 5
Plan Fractions Treated to Date: 4
Plan Prescribed Dose Per Fraction: 1.8 Gy
Plan Total Fractions Prescribed: 25
Plan Total Prescribed Dose: 45 Gy
Reference Point Dosage Given to Date: 7.2 Gy
Reference Point Session Dosage Given: 1.8 Gy
Session Number: 4

## 2024-05-06 ENCOUNTER — Other Ambulatory Visit: Payer: Self-pay

## 2024-05-06 ENCOUNTER — Ambulatory Visit
Admission: RE | Admit: 2024-05-06 | Discharge: 2024-05-06 | Disposition: A | Discharge status: Home / Self Care | Source: Ambulatory Visit | Attending: Radiation Oncology | Admitting: Radiation Oncology

## 2024-05-06 DIAGNOSIS — R972 Elevated prostate specific antigen [PSA]: Secondary | ICD-10-CM | POA: Diagnosis not present

## 2024-05-06 DIAGNOSIS — Z510 Encounter for antineoplastic radiation therapy: Secondary | ICD-10-CM | POA: Diagnosis not present

## 2024-05-06 DIAGNOSIS — Z191 Hormone sensitive malignancy status: Secondary | ICD-10-CM | POA: Diagnosis not present

## 2024-05-06 DIAGNOSIS — C61 Malignant neoplasm of prostate: Secondary | ICD-10-CM | POA: Diagnosis not present

## 2024-05-06 LAB — RAD ONC ARIA SESSION SUMMARY
Course Elapsed Days: 6
Plan Fractions Treated to Date: 5
Plan Prescribed Dose Per Fraction: 1.8 Gy
Plan Total Fractions Prescribed: 25
Plan Total Prescribed Dose: 45 Gy
Reference Point Dosage Given to Date: 9 Gy
Reference Point Session Dosage Given: 1.8 Gy
Session Number: 5

## 2024-05-07 ENCOUNTER — Other Ambulatory Visit: Payer: Self-pay

## 2024-05-07 ENCOUNTER — Ambulatory Visit
Admission: RE | Admit: 2024-05-07 | Discharge: 2024-05-07 | Disposition: A | Discharge status: Home / Self Care | Source: Ambulatory Visit | Attending: Radiation Oncology | Admitting: Radiation Oncology

## 2024-05-07 DIAGNOSIS — Z510 Encounter for antineoplastic radiation therapy: Secondary | ICD-10-CM | POA: Diagnosis not present

## 2024-05-07 DIAGNOSIS — R972 Elevated prostate specific antigen [PSA]: Secondary | ICD-10-CM | POA: Diagnosis not present

## 2024-05-07 DIAGNOSIS — Z191 Hormone sensitive malignancy status: Secondary | ICD-10-CM | POA: Diagnosis not present

## 2024-05-07 DIAGNOSIS — C61 Malignant neoplasm of prostate: Secondary | ICD-10-CM | POA: Diagnosis not present

## 2024-05-07 LAB — RAD ONC ARIA SESSION SUMMARY
Course Elapsed Days: 7
Plan Fractions Treated to Date: 6
Plan Prescribed Dose Per Fraction: 1.8 Gy
Plan Total Fractions Prescribed: 25
Plan Total Prescribed Dose: 45 Gy
Reference Point Dosage Given to Date: 10.8 Gy
Reference Point Session Dosage Given: 1.8 Gy
Session Number: 6

## 2024-05-08 ENCOUNTER — Ambulatory Visit
Admission: RE | Admit: 2024-05-08 | Discharge: 2024-05-08 | Disposition: A | Discharge status: Home / Self Care | Source: Ambulatory Visit | Attending: Radiation Oncology | Admitting: Radiation Oncology

## 2024-05-08 ENCOUNTER — Other Ambulatory Visit: Payer: Self-pay

## 2024-05-08 DIAGNOSIS — R972 Elevated prostate specific antigen [PSA]: Secondary | ICD-10-CM | POA: Diagnosis not present

## 2024-05-08 DIAGNOSIS — Z191 Hormone sensitive malignancy status: Secondary | ICD-10-CM | POA: Diagnosis not present

## 2024-05-08 DIAGNOSIS — Z510 Encounter for antineoplastic radiation therapy: Secondary | ICD-10-CM | POA: Diagnosis not present

## 2024-05-08 DIAGNOSIS — C61 Malignant neoplasm of prostate: Secondary | ICD-10-CM | POA: Diagnosis not present

## 2024-05-08 LAB — RAD ONC ARIA SESSION SUMMARY
Course Elapsed Days: 8
Plan Fractions Treated to Date: 7
Plan Prescribed Dose Per Fraction: 1.8 Gy
Plan Total Fractions Prescribed: 25
Plan Total Prescribed Dose: 45 Gy
Reference Point Dosage Given to Date: 12.6 Gy
Reference Point Session Dosage Given: 1.8 Gy
Session Number: 7

## 2024-05-11 ENCOUNTER — Ambulatory Visit
Admission: RE | Admit: 2024-05-11 | Discharge: 2024-05-11 | Disposition: A | Discharge status: Home / Self Care | Source: Ambulatory Visit | Attending: Radiation Oncology | Admitting: Radiation Oncology

## 2024-05-11 ENCOUNTER — Other Ambulatory Visit: Payer: Self-pay

## 2024-05-11 DIAGNOSIS — R972 Elevated prostate specific antigen [PSA]: Secondary | ICD-10-CM | POA: Diagnosis not present

## 2024-05-11 DIAGNOSIS — Z191 Hormone sensitive malignancy status: Secondary | ICD-10-CM | POA: Diagnosis not present

## 2024-05-11 DIAGNOSIS — C61 Malignant neoplasm of prostate: Secondary | ICD-10-CM | POA: Diagnosis not present

## 2024-05-11 DIAGNOSIS — Z510 Encounter for antineoplastic radiation therapy: Secondary | ICD-10-CM | POA: Diagnosis not present

## 2024-05-11 LAB — RAD ONC ARIA SESSION SUMMARY
Course Elapsed Days: 11
Plan Fractions Treated to Date: 8
Plan Prescribed Dose Per Fraction: 1.8 Gy
Plan Total Fractions Prescribed: 25
Plan Total Prescribed Dose: 45 Gy
Reference Point Dosage Given to Date: 14.4 Gy
Reference Point Session Dosage Given: 1.8 Gy
Session Number: 8

## 2024-05-12 ENCOUNTER — Other Ambulatory Visit: Payer: Self-pay

## 2024-05-12 ENCOUNTER — Ambulatory Visit
Admission: RE | Admit: 2024-05-12 | Discharge: 2024-05-12 | Disposition: A | Discharge status: Home / Self Care | Source: Ambulatory Visit | Attending: Radiation Oncology | Admitting: Radiation Oncology

## 2024-05-12 DIAGNOSIS — Z510 Encounter for antineoplastic radiation therapy: Secondary | ICD-10-CM | POA: Diagnosis not present

## 2024-05-12 DIAGNOSIS — C61 Malignant neoplasm of prostate: Secondary | ICD-10-CM | POA: Diagnosis not present

## 2024-05-12 DIAGNOSIS — R972 Elevated prostate specific antigen [PSA]: Secondary | ICD-10-CM | POA: Diagnosis not present

## 2024-05-12 DIAGNOSIS — Z191 Hormone sensitive malignancy status: Secondary | ICD-10-CM | POA: Diagnosis not present

## 2024-05-12 LAB — RAD ONC ARIA SESSION SUMMARY
Course Elapsed Days: 12
Plan Fractions Treated to Date: 9
Plan Prescribed Dose Per Fraction: 1.8 Gy
Plan Total Fractions Prescribed: 25
Plan Total Prescribed Dose: 45 Gy
Reference Point Dosage Given to Date: 16.2 Gy
Reference Point Session Dosage Given: 1.8 Gy
Session Number: 9

## 2024-05-13 ENCOUNTER — Other Ambulatory Visit: Payer: Self-pay

## 2024-05-13 ENCOUNTER — Ambulatory Visit
Admission: RE | Admit: 2024-05-13 | Discharge: 2024-05-13 | Disposition: A | Discharge status: Home / Self Care | Source: Ambulatory Visit | Attending: Radiation Oncology | Admitting: Radiation Oncology

## 2024-05-13 DIAGNOSIS — R972 Elevated prostate specific antigen [PSA]: Secondary | ICD-10-CM | POA: Diagnosis not present

## 2024-05-13 DIAGNOSIS — Z191 Hormone sensitive malignancy status: Secondary | ICD-10-CM | POA: Diagnosis not present

## 2024-05-13 DIAGNOSIS — C61 Malignant neoplasm of prostate: Secondary | ICD-10-CM | POA: Diagnosis not present

## 2024-05-13 DIAGNOSIS — Z510 Encounter for antineoplastic radiation therapy: Secondary | ICD-10-CM | POA: Diagnosis not present

## 2024-05-13 LAB — RAD ONC ARIA SESSION SUMMARY
Course Elapsed Days: 13
Plan Fractions Treated to Date: 10
Plan Prescribed Dose Per Fraction: 1.8 Gy
Plan Total Fractions Prescribed: 25
Plan Total Prescribed Dose: 45 Gy
Reference Point Dosage Given to Date: 18 Gy
Reference Point Session Dosage Given: 1.8 Gy
Session Number: 10

## 2024-05-14 ENCOUNTER — Ambulatory Visit
Admission: RE | Admit: 2024-05-14 | Discharge: 2024-05-14 | Disposition: A | Discharge status: Home / Self Care | Source: Ambulatory Visit | Attending: Radiation Oncology | Admitting: Radiation Oncology

## 2024-05-14 ENCOUNTER — Other Ambulatory Visit: Payer: Self-pay

## 2024-05-14 DIAGNOSIS — Z510 Encounter for antineoplastic radiation therapy: Secondary | ICD-10-CM | POA: Diagnosis not present

## 2024-05-14 DIAGNOSIS — R972 Elevated prostate specific antigen [PSA]: Secondary | ICD-10-CM | POA: Diagnosis not present

## 2024-05-14 DIAGNOSIS — C61 Malignant neoplasm of prostate: Secondary | ICD-10-CM | POA: Diagnosis not present

## 2024-05-14 DIAGNOSIS — Z191 Hormone sensitive malignancy status: Secondary | ICD-10-CM | POA: Diagnosis not present

## 2024-05-14 LAB — RAD ONC ARIA SESSION SUMMARY
Course Elapsed Days: 14
Plan Fractions Treated to Date: 11
Plan Prescribed Dose Per Fraction: 1.8 Gy
Plan Total Fractions Prescribed: 25
Plan Total Prescribed Dose: 45 Gy
Reference Point Dosage Given to Date: 19.8 Gy
Reference Point Session Dosage Given: 1.8 Gy
Session Number: 11

## 2024-05-15 ENCOUNTER — Other Ambulatory Visit: Payer: Self-pay

## 2024-05-15 ENCOUNTER — Ambulatory Visit
Admission: RE | Admit: 2024-05-15 | Discharge: 2024-05-15 | Disposition: A | Discharge status: Home / Self Care | Source: Ambulatory Visit | Attending: Radiation Oncology | Admitting: Radiation Oncology

## 2024-05-15 DIAGNOSIS — Z510 Encounter for antineoplastic radiation therapy: Secondary | ICD-10-CM | POA: Diagnosis not present

## 2024-05-15 DIAGNOSIS — R972 Elevated prostate specific antigen [PSA]: Secondary | ICD-10-CM | POA: Diagnosis not present

## 2024-05-15 DIAGNOSIS — C61 Malignant neoplasm of prostate: Secondary | ICD-10-CM | POA: Diagnosis not present

## 2024-05-15 DIAGNOSIS — Z191 Hormone sensitive malignancy status: Secondary | ICD-10-CM | POA: Diagnosis not present

## 2024-05-15 LAB — RAD ONC ARIA SESSION SUMMARY
Course Elapsed Days: 15
Plan Fractions Treated to Date: 12
Plan Prescribed Dose Per Fraction: 1.8 Gy
Plan Total Fractions Prescribed: 25
Plan Total Prescribed Dose: 45 Gy
Reference Point Dosage Given to Date: 21.6 Gy
Reference Point Session Dosage Given: 1.8 Gy
Session Number: 12

## 2024-05-19 ENCOUNTER — Other Ambulatory Visit: Payer: Self-pay

## 2024-05-19 ENCOUNTER — Ambulatory Visit
Admission: RE | Admit: 2024-05-19 | Discharge: 2024-05-19 | Discharge status: Home / Self Care | Source: Ambulatory Visit | Attending: Radiation Oncology

## 2024-05-19 DIAGNOSIS — R972 Elevated prostate specific antigen [PSA]: Secondary | ICD-10-CM | POA: Diagnosis not present

## 2024-05-19 DIAGNOSIS — Z191 Hormone sensitive malignancy status: Secondary | ICD-10-CM | POA: Diagnosis not present

## 2024-05-19 DIAGNOSIS — Z510 Encounter for antineoplastic radiation therapy: Secondary | ICD-10-CM | POA: Diagnosis not present

## 2024-05-19 DIAGNOSIS — C61 Malignant neoplasm of prostate: Secondary | ICD-10-CM | POA: Diagnosis not present

## 2024-05-19 LAB — RAD ONC ARIA SESSION SUMMARY
Course Elapsed Days: 19
Plan Fractions Treated to Date: 13
Plan Prescribed Dose Per Fraction: 1.8 Gy
Plan Total Fractions Prescribed: 25
Plan Total Prescribed Dose: 45 Gy
Reference Point Dosage Given to Date: 23.4 Gy
Reference Point Session Dosage Given: 1.8 Gy
Session Number: 13

## 2024-05-20 ENCOUNTER — Ambulatory Visit
Admission: RE | Admit: 2024-05-20 | Discharge: 2024-05-20 | Discharge status: Home / Self Care | Source: Ambulatory Visit | Attending: Radiation Oncology

## 2024-05-20 ENCOUNTER — Other Ambulatory Visit: Payer: Self-pay

## 2024-05-20 DIAGNOSIS — Z510 Encounter for antineoplastic radiation therapy: Secondary | ICD-10-CM | POA: Diagnosis not present

## 2024-05-20 DIAGNOSIS — R972 Elevated prostate specific antigen [PSA]: Secondary | ICD-10-CM | POA: Diagnosis not present

## 2024-05-20 DIAGNOSIS — Z191 Hormone sensitive malignancy status: Secondary | ICD-10-CM | POA: Diagnosis not present

## 2024-05-20 DIAGNOSIS — C61 Malignant neoplasm of prostate: Secondary | ICD-10-CM | POA: Diagnosis not present

## 2024-05-20 LAB — RAD ONC ARIA SESSION SUMMARY
Course Elapsed Days: 20
Plan Fractions Treated to Date: 14
Plan Prescribed Dose Per Fraction: 1.8 Gy
Plan Total Fractions Prescribed: 25
Plan Total Prescribed Dose: 45 Gy
Reference Point Dosage Given to Date: 25.2 Gy
Reference Point Session Dosage Given: 1.8 Gy
Session Number: 14

## 2024-05-21 ENCOUNTER — Ambulatory Visit
Admission: RE | Admit: 2024-05-21 | Discharge: 2024-05-21 | Disposition: A | Discharge status: Home / Self Care | Source: Ambulatory Visit | Attending: Radiation Oncology

## 2024-05-21 ENCOUNTER — Other Ambulatory Visit: Payer: Self-pay

## 2024-05-21 DIAGNOSIS — Z191 Hormone sensitive malignancy status: Secondary | ICD-10-CM | POA: Diagnosis not present

## 2024-05-21 DIAGNOSIS — R972 Elevated prostate specific antigen [PSA]: Secondary | ICD-10-CM | POA: Diagnosis not present

## 2024-05-21 DIAGNOSIS — C61 Malignant neoplasm of prostate: Secondary | ICD-10-CM | POA: Diagnosis not present

## 2024-05-21 DIAGNOSIS — Z510 Encounter for antineoplastic radiation therapy: Secondary | ICD-10-CM | POA: Diagnosis not present

## 2024-05-21 LAB — RAD ONC ARIA SESSION SUMMARY
Course Elapsed Days: 21
Plan Fractions Treated to Date: 15
Plan Prescribed Dose Per Fraction: 1.8 Gy
Plan Total Fractions Prescribed: 25
Plan Total Prescribed Dose: 45 Gy
Reference Point Dosage Given to Date: 27 Gy
Reference Point Session Dosage Given: 1.8 Gy
Session Number: 15

## 2024-05-22 ENCOUNTER — Other Ambulatory Visit: Payer: Self-pay

## 2024-05-22 ENCOUNTER — Ambulatory Visit
Admission: RE | Admit: 2024-05-22 | Discharge: 2024-05-22 | Disposition: A | Discharge status: Home / Self Care | Source: Ambulatory Visit | Attending: Radiation Oncology | Admitting: Radiation Oncology

## 2024-05-22 DIAGNOSIS — R972 Elevated prostate specific antigen [PSA]: Secondary | ICD-10-CM | POA: Diagnosis not present

## 2024-05-22 DIAGNOSIS — C61 Malignant neoplasm of prostate: Secondary | ICD-10-CM | POA: Diagnosis not present

## 2024-05-22 DIAGNOSIS — Z510 Encounter for antineoplastic radiation therapy: Secondary | ICD-10-CM | POA: Diagnosis not present

## 2024-05-22 DIAGNOSIS — Z191 Hormone sensitive malignancy status: Secondary | ICD-10-CM | POA: Diagnosis not present

## 2024-05-22 LAB — RAD ONC ARIA SESSION SUMMARY
Course Elapsed Days: 22
Plan Fractions Treated to Date: 16
Plan Prescribed Dose Per Fraction: 1.8 Gy
Plan Total Fractions Prescribed: 25
Plan Total Prescribed Dose: 45 Gy
Reference Point Dosage Given to Date: 28.8 Gy
Reference Point Session Dosage Given: 1.8 Gy
Session Number: 16

## 2024-05-25 ENCOUNTER — Ambulatory Visit
Admission: RE | Admit: 2024-05-25 | Discharge: 2024-05-25 | Disposition: A | Discharge status: Home / Self Care | Source: Ambulatory Visit | Attending: Radiation Oncology | Admitting: Radiation Oncology

## 2024-05-25 ENCOUNTER — Other Ambulatory Visit: Payer: Self-pay

## 2024-05-25 DIAGNOSIS — C61 Malignant neoplasm of prostate: Secondary | ICD-10-CM | POA: Insufficient documentation

## 2024-05-25 DIAGNOSIS — R972 Elevated prostate specific antigen [PSA]: Secondary | ICD-10-CM | POA: Diagnosis not present

## 2024-05-25 DIAGNOSIS — Z510 Encounter for antineoplastic radiation therapy: Secondary | ICD-10-CM | POA: Insufficient documentation

## 2024-05-25 DIAGNOSIS — Z191 Hormone sensitive malignancy status: Secondary | ICD-10-CM | POA: Diagnosis not present

## 2024-05-25 LAB — RAD ONC ARIA SESSION SUMMARY
Course Elapsed Days: 25
Plan Fractions Treated to Date: 17
Plan Prescribed Dose Per Fraction: 1.8 Gy
Plan Total Fractions Prescribed: 25
Plan Total Prescribed Dose: 45 Gy
Reference Point Dosage Given to Date: 30.6 Gy
Reference Point Session Dosage Given: 1.8 Gy
Session Number: 17

## 2024-05-26 ENCOUNTER — Other Ambulatory Visit: Payer: Self-pay

## 2024-05-26 ENCOUNTER — Ambulatory Visit
Admission: RE | Admit: 2024-05-26 | Discharge: 2024-05-26 | Disposition: A | Discharge status: Home / Self Care | Source: Ambulatory Visit | Attending: Radiation Oncology | Admitting: Radiation Oncology

## 2024-05-26 ENCOUNTER — Ambulatory Visit

## 2024-05-26 DIAGNOSIS — R972 Elevated prostate specific antigen [PSA]: Secondary | ICD-10-CM | POA: Diagnosis not present

## 2024-05-26 DIAGNOSIS — Z191 Hormone sensitive malignancy status: Secondary | ICD-10-CM | POA: Diagnosis not present

## 2024-05-26 DIAGNOSIS — C61 Malignant neoplasm of prostate: Secondary | ICD-10-CM | POA: Diagnosis not present

## 2024-05-26 DIAGNOSIS — Z510 Encounter for antineoplastic radiation therapy: Secondary | ICD-10-CM | POA: Diagnosis not present

## 2024-05-26 LAB — RAD ONC ARIA SESSION SUMMARY
Course Elapsed Days: 26
Plan Fractions Treated to Date: 18
Plan Prescribed Dose Per Fraction: 1.8 Gy
Plan Total Fractions Prescribed: 25
Plan Total Prescribed Dose: 45 Gy
Reference Point Dosage Given to Date: 32.4 Gy
Reference Point Session Dosage Given: 1.8 Gy
Session Number: 18

## 2024-05-27 ENCOUNTER — Ambulatory Visit
Admission: RE | Admit: 2024-05-27 | Discharge: 2024-05-27 | Disposition: A | Discharge status: Home / Self Care | Source: Ambulatory Visit | Attending: Radiation Oncology

## 2024-05-27 ENCOUNTER — Other Ambulatory Visit: Payer: Self-pay

## 2024-05-27 DIAGNOSIS — R972 Elevated prostate specific antigen [PSA]: Secondary | ICD-10-CM | POA: Diagnosis not present

## 2024-05-27 DIAGNOSIS — Z191 Hormone sensitive malignancy status: Secondary | ICD-10-CM | POA: Diagnosis not present

## 2024-05-27 DIAGNOSIS — Z510 Encounter for antineoplastic radiation therapy: Secondary | ICD-10-CM | POA: Diagnosis not present

## 2024-05-27 DIAGNOSIS — C61 Malignant neoplasm of prostate: Secondary | ICD-10-CM | POA: Diagnosis not present

## 2024-05-27 LAB — RAD ONC ARIA SESSION SUMMARY
Course Elapsed Days: 27
Plan Fractions Treated to Date: 19
Plan Prescribed Dose Per Fraction: 1.8 Gy
Plan Total Fractions Prescribed: 25
Plan Total Prescribed Dose: 45 Gy
Reference Point Dosage Given to Date: 34.2 Gy
Reference Point Session Dosage Given: 1.8 Gy
Session Number: 19

## 2024-05-28 ENCOUNTER — Ambulatory Visit
Admission: RE | Admit: 2024-05-28 | Discharge: 2024-05-28 | Disposition: A | Discharge status: Home / Self Care | Source: Ambulatory Visit | Attending: Radiation Oncology | Admitting: Radiation Oncology

## 2024-05-28 ENCOUNTER — Other Ambulatory Visit: Payer: Self-pay

## 2024-05-28 DIAGNOSIS — Z191 Hormone sensitive malignancy status: Secondary | ICD-10-CM | POA: Diagnosis not present

## 2024-05-28 DIAGNOSIS — C61 Malignant neoplasm of prostate: Secondary | ICD-10-CM | POA: Diagnosis not present

## 2024-05-28 DIAGNOSIS — Z510 Encounter for antineoplastic radiation therapy: Secondary | ICD-10-CM | POA: Diagnosis not present

## 2024-05-28 DIAGNOSIS — R972 Elevated prostate specific antigen [PSA]: Secondary | ICD-10-CM | POA: Diagnosis not present

## 2024-05-28 LAB — RAD ONC ARIA SESSION SUMMARY
Course Elapsed Days: 28
Plan Fractions Treated to Date: 20
Plan Prescribed Dose Per Fraction: 1.8 Gy
Plan Total Fractions Prescribed: 25
Plan Total Prescribed Dose: 45 Gy
Reference Point Dosage Given to Date: 36 Gy
Reference Point Session Dosage Given: 1.8 Gy
Session Number: 20

## 2024-05-29 ENCOUNTER — Ambulatory Visit
Admission: RE | Admit: 2024-05-29 | Discharge: 2024-05-29 | Disposition: A | Discharge status: Home / Self Care | Source: Ambulatory Visit | Attending: Radiation Oncology | Admitting: Radiation Oncology

## 2024-05-29 ENCOUNTER — Other Ambulatory Visit: Payer: Self-pay

## 2024-05-29 DIAGNOSIS — Z191 Hormone sensitive malignancy status: Secondary | ICD-10-CM | POA: Diagnosis not present

## 2024-05-29 DIAGNOSIS — C61 Malignant neoplasm of prostate: Secondary | ICD-10-CM | POA: Diagnosis not present

## 2024-05-29 DIAGNOSIS — Z510 Encounter for antineoplastic radiation therapy: Secondary | ICD-10-CM | POA: Diagnosis not present

## 2024-05-29 DIAGNOSIS — R972 Elevated prostate specific antigen [PSA]: Secondary | ICD-10-CM | POA: Diagnosis not present

## 2024-05-29 LAB — RAD ONC ARIA SESSION SUMMARY
Course Elapsed Days: 29
Plan Fractions Treated to Date: 21
Plan Prescribed Dose Per Fraction: 1.8 Gy
Plan Total Fractions Prescribed: 25
Plan Total Prescribed Dose: 45 Gy
Reference Point Dosage Given to Date: 37.8 Gy
Reference Point Session Dosage Given: 1.8 Gy
Session Number: 21

## 2024-06-01 ENCOUNTER — Other Ambulatory Visit: Payer: Self-pay

## 2024-06-01 ENCOUNTER — Ambulatory Visit
Admission: RE | Admit: 2024-06-01 | Discharge: 2024-06-01 | Disposition: A | Discharge status: Home / Self Care | Source: Ambulatory Visit | Attending: Radiation Oncology

## 2024-06-01 DIAGNOSIS — C61 Malignant neoplasm of prostate: Secondary | ICD-10-CM | POA: Diagnosis not present

## 2024-06-01 DIAGNOSIS — Z510 Encounter for antineoplastic radiation therapy: Secondary | ICD-10-CM | POA: Diagnosis not present

## 2024-06-01 DIAGNOSIS — Z191 Hormone sensitive malignancy status: Secondary | ICD-10-CM | POA: Diagnosis not present

## 2024-06-01 DIAGNOSIS — R972 Elevated prostate specific antigen [PSA]: Secondary | ICD-10-CM | POA: Diagnosis not present

## 2024-06-01 LAB — RAD ONC ARIA SESSION SUMMARY
Course Elapsed Days: 32
Plan Fractions Treated to Date: 22
Plan Prescribed Dose Per Fraction: 1.8 Gy
Plan Total Fractions Prescribed: 25
Plan Total Prescribed Dose: 45 Gy
Reference Point Dosage Given to Date: 39.6 Gy
Reference Point Session Dosage Given: 1.8 Gy
Session Number: 22

## 2024-06-02 ENCOUNTER — Ambulatory Visit
Admission: RE | Admit: 2024-06-02 | Discharge: 2024-06-02 | Disposition: A | Discharge status: Home / Self Care | Source: Ambulatory Visit | Attending: Radiation Oncology

## 2024-06-02 ENCOUNTER — Other Ambulatory Visit: Payer: Self-pay

## 2024-06-02 DIAGNOSIS — C61 Malignant neoplasm of prostate: Secondary | ICD-10-CM | POA: Diagnosis not present

## 2024-06-02 DIAGNOSIS — Z191 Hormone sensitive malignancy status: Secondary | ICD-10-CM | POA: Diagnosis not present

## 2024-06-02 DIAGNOSIS — R972 Elevated prostate specific antigen [PSA]: Secondary | ICD-10-CM | POA: Diagnosis not present

## 2024-06-02 DIAGNOSIS — Z510 Encounter for antineoplastic radiation therapy: Secondary | ICD-10-CM | POA: Diagnosis not present

## 2024-06-02 LAB — RAD ONC ARIA SESSION SUMMARY
Course Elapsed Days: 33
Plan Fractions Treated to Date: 23
Plan Prescribed Dose Per Fraction: 1.8 Gy
Plan Total Fractions Prescribed: 25
Plan Total Prescribed Dose: 45 Gy
Reference Point Dosage Given to Date: 41.4 Gy
Reference Point Session Dosage Given: 1.8 Gy
Session Number: 23

## 2024-06-03 ENCOUNTER — Other Ambulatory Visit: Payer: Self-pay

## 2024-06-03 ENCOUNTER — Ambulatory Visit
Admission: RE | Admit: 2024-06-03 | Discharge: 2024-06-03 | Disposition: A | Discharge status: Home / Self Care | Source: Ambulatory Visit | Attending: Radiation Oncology | Admitting: Radiation Oncology

## 2024-06-03 DIAGNOSIS — Z510 Encounter for antineoplastic radiation therapy: Secondary | ICD-10-CM | POA: Diagnosis not present

## 2024-06-03 DIAGNOSIS — C61 Malignant neoplasm of prostate: Secondary | ICD-10-CM | POA: Diagnosis not present

## 2024-06-03 DIAGNOSIS — Z191 Hormone sensitive malignancy status: Secondary | ICD-10-CM | POA: Diagnosis not present

## 2024-06-03 DIAGNOSIS — R972 Elevated prostate specific antigen [PSA]: Secondary | ICD-10-CM | POA: Diagnosis not present

## 2024-06-03 LAB — RAD ONC ARIA SESSION SUMMARY
Course Elapsed Days: 34
Plan Fractions Treated to Date: 24
Plan Prescribed Dose Per Fraction: 1.8 Gy
Plan Total Fractions Prescribed: 25
Plan Total Prescribed Dose: 45 Gy
Reference Point Dosage Given to Date: 43.2 Gy
Reference Point Session Dosage Given: 1.8 Gy
Session Number: 24

## 2024-06-04 ENCOUNTER — Ambulatory Visit
Admission: RE | Admit: 2024-06-04 | Discharge: 2024-06-04 | Disposition: A | Discharge status: Home / Self Care | Source: Ambulatory Visit | Attending: Radiation Oncology | Admitting: Radiation Oncology

## 2024-06-04 ENCOUNTER — Other Ambulatory Visit: Payer: Self-pay

## 2024-06-04 DIAGNOSIS — Z510 Encounter for antineoplastic radiation therapy: Secondary | ICD-10-CM | POA: Diagnosis not present

## 2024-06-04 DIAGNOSIS — C61 Malignant neoplasm of prostate: Secondary | ICD-10-CM | POA: Diagnosis not present

## 2024-06-04 DIAGNOSIS — R972 Elevated prostate specific antigen [PSA]: Secondary | ICD-10-CM | POA: Diagnosis not present

## 2024-06-04 DIAGNOSIS — Z191 Hormone sensitive malignancy status: Secondary | ICD-10-CM | POA: Diagnosis not present

## 2024-06-04 LAB — RAD ONC ARIA SESSION SUMMARY
Course Elapsed Days: 35
Plan Fractions Treated to Date: 25
Plan Prescribed Dose Per Fraction: 1.8 Gy
Plan Total Fractions Prescribed: 25
Plan Total Prescribed Dose: 45 Gy
Reference Point Dosage Given to Date: 45 Gy
Reference Point Session Dosage Given: 1.8 Gy
Session Number: 25

## 2024-06-05 ENCOUNTER — Ambulatory Visit

## 2024-06-05 ENCOUNTER — Ambulatory Visit
Admission: RE | Admit: 2024-06-05 | Discharge: 2024-06-05 | Disposition: A | Discharge status: Home / Self Care | Source: Ambulatory Visit | Attending: Radiation Oncology

## 2024-06-05 ENCOUNTER — Other Ambulatory Visit: Payer: Self-pay

## 2024-06-05 ENCOUNTER — Ambulatory Visit
Admission: RE | Admit: 2024-06-05 | Discharge: 2024-06-05 | Disposition: A | Discharge status: Home / Self Care | Source: Ambulatory Visit | Attending: Radiation Oncology | Admitting: Radiation Oncology

## 2024-06-05 DIAGNOSIS — C61 Malignant neoplasm of prostate: Secondary | ICD-10-CM | POA: Diagnosis not present

## 2024-06-05 DIAGNOSIS — Z191 Hormone sensitive malignancy status: Secondary | ICD-10-CM | POA: Diagnosis not present

## 2024-06-05 DIAGNOSIS — Z510 Encounter for antineoplastic radiation therapy: Secondary | ICD-10-CM | POA: Diagnosis not present

## 2024-06-05 DIAGNOSIS — R972 Elevated prostate specific antigen [PSA]: Secondary | ICD-10-CM | POA: Diagnosis not present

## 2024-06-05 LAB — RAD ONC ARIA SESSION SUMMARY
Course Elapsed Days: 36
Plan Fractions Treated to Date: 1
Plan Prescribed Dose Per Fraction: 2 Gy
Plan Total Fractions Prescribed: 15
Plan Total Prescribed Dose: 30 Gy
Reference Point Dosage Given to Date: 2 Gy
Reference Point Session Dosage Given: 2 Gy
Session Number: 26

## 2024-06-08 ENCOUNTER — Other Ambulatory Visit: Payer: Self-pay

## 2024-06-08 ENCOUNTER — Ambulatory Visit

## 2024-06-08 ENCOUNTER — Ambulatory Visit
Admission: RE | Admit: 2024-06-08 | Discharge: 2024-06-08 | Disposition: A | Discharge status: Home / Self Care | Source: Ambulatory Visit | Attending: Radiation Oncology

## 2024-06-08 DIAGNOSIS — R972 Elevated prostate specific antigen [PSA]: Secondary | ICD-10-CM | POA: Diagnosis not present

## 2024-06-08 DIAGNOSIS — C61 Malignant neoplasm of prostate: Secondary | ICD-10-CM | POA: Diagnosis not present

## 2024-06-08 DIAGNOSIS — Z510 Encounter for antineoplastic radiation therapy: Secondary | ICD-10-CM | POA: Diagnosis not present

## 2024-06-08 DIAGNOSIS — Z191 Hormone sensitive malignancy status: Secondary | ICD-10-CM | POA: Diagnosis not present

## 2024-06-08 LAB — RAD ONC ARIA SESSION SUMMARY
Course Elapsed Days: 39
Plan Fractions Treated to Date: 2
Plan Prescribed Dose Per Fraction: 2 Gy
Plan Total Fractions Prescribed: 15
Plan Total Prescribed Dose: 30 Gy
Reference Point Dosage Given to Date: 4 Gy
Reference Point Session Dosage Given: 2 Gy
Session Number: 27

## 2024-06-09 ENCOUNTER — Ambulatory Visit
Admission: RE | Admit: 2024-06-09 | Discharge: 2024-06-09 | Disposition: A | Discharge status: Home / Self Care | Source: Ambulatory Visit | Attending: Radiation Oncology | Admitting: Radiation Oncology

## 2024-06-09 ENCOUNTER — Other Ambulatory Visit: Payer: Self-pay

## 2024-06-09 DIAGNOSIS — C61 Malignant neoplasm of prostate: Secondary | ICD-10-CM | POA: Diagnosis not present

## 2024-06-09 DIAGNOSIS — R972 Elevated prostate specific antigen [PSA]: Secondary | ICD-10-CM | POA: Diagnosis not present

## 2024-06-09 DIAGNOSIS — Z191 Hormone sensitive malignancy status: Secondary | ICD-10-CM | POA: Diagnosis not present

## 2024-06-09 DIAGNOSIS — Z510 Encounter for antineoplastic radiation therapy: Secondary | ICD-10-CM | POA: Diagnosis not present

## 2024-06-09 LAB — RAD ONC ARIA SESSION SUMMARY
Course Elapsed Days: 40
Plan Fractions Treated to Date: 3
Plan Prescribed Dose Per Fraction: 2 Gy
Plan Total Fractions Prescribed: 15
Plan Total Prescribed Dose: 30 Gy
Reference Point Dosage Given to Date: 6 Gy
Reference Point Session Dosage Given: 2 Gy
Session Number: 28

## 2024-06-10 ENCOUNTER — Ambulatory Visit
Admission: RE | Admit: 2024-06-10 | Discharge: 2024-06-10 | Disposition: A | Discharge status: Home / Self Care | Source: Ambulatory Visit | Attending: Radiation Oncology | Admitting: Radiation Oncology

## 2024-06-10 ENCOUNTER — Other Ambulatory Visit: Payer: Self-pay

## 2024-06-10 DIAGNOSIS — Z191 Hormone sensitive malignancy status: Secondary | ICD-10-CM | POA: Diagnosis not present

## 2024-06-10 DIAGNOSIS — C61 Malignant neoplasm of prostate: Secondary | ICD-10-CM | POA: Diagnosis not present

## 2024-06-10 DIAGNOSIS — R972 Elevated prostate specific antigen [PSA]: Secondary | ICD-10-CM | POA: Diagnosis not present

## 2024-06-10 DIAGNOSIS — Z510 Encounter for antineoplastic radiation therapy: Secondary | ICD-10-CM | POA: Diagnosis not present

## 2024-06-10 LAB — RAD ONC ARIA SESSION SUMMARY
Course Elapsed Days: 41
Plan Fractions Treated to Date: 4
Plan Prescribed Dose Per Fraction: 2 Gy
Plan Total Fractions Prescribed: 15
Plan Total Prescribed Dose: 30 Gy
Reference Point Dosage Given to Date: 8 Gy
Reference Point Session Dosage Given: 2 Gy
Session Number: 29

## 2024-06-11 ENCOUNTER — Other Ambulatory Visit: Payer: Self-pay

## 2024-06-11 ENCOUNTER — Ambulatory Visit
Admission: RE | Admit: 2024-06-11 | Discharge: 2024-06-11 | Disposition: A | Discharge status: Home / Self Care | Source: Ambulatory Visit | Attending: Radiation Oncology

## 2024-06-11 DIAGNOSIS — Z191 Hormone sensitive malignancy status: Secondary | ICD-10-CM | POA: Diagnosis not present

## 2024-06-11 DIAGNOSIS — R972 Elevated prostate specific antigen [PSA]: Secondary | ICD-10-CM | POA: Diagnosis not present

## 2024-06-11 DIAGNOSIS — Z510 Encounter for antineoplastic radiation therapy: Secondary | ICD-10-CM | POA: Diagnosis not present

## 2024-06-11 DIAGNOSIS — C61 Malignant neoplasm of prostate: Secondary | ICD-10-CM | POA: Diagnosis not present

## 2024-06-11 LAB — RAD ONC ARIA SESSION SUMMARY
Course Elapsed Days: 42
Plan Fractions Treated to Date: 1
Plan Prescribed Dose Per Fraction: 2 Gy
Plan Total Fractions Prescribed: 11
Plan Total Prescribed Dose: 22 Gy
Reference Point Dosage Given to Date: 10 Gy
Reference Point Session Dosage Given: 2 Gy
Session Number: 30

## 2024-06-12 ENCOUNTER — Other Ambulatory Visit: Payer: Self-pay

## 2024-06-12 ENCOUNTER — Ambulatory Visit
Admission: RE | Admit: 2024-06-12 | Discharge: 2024-06-12 | Disposition: A | Discharge status: Home / Self Care | Source: Ambulatory Visit | Attending: Radiation Oncology | Admitting: Radiation Oncology

## 2024-06-12 DIAGNOSIS — Z191 Hormone sensitive malignancy status: Secondary | ICD-10-CM | POA: Diagnosis not present

## 2024-06-12 DIAGNOSIS — R972 Elevated prostate specific antigen [PSA]: Secondary | ICD-10-CM | POA: Diagnosis not present

## 2024-06-12 DIAGNOSIS — C61 Malignant neoplasm of prostate: Secondary | ICD-10-CM | POA: Diagnosis not present

## 2024-06-12 DIAGNOSIS — Z510 Encounter for antineoplastic radiation therapy: Secondary | ICD-10-CM | POA: Diagnosis not present

## 2024-06-12 LAB — RAD ONC ARIA SESSION SUMMARY
Course Elapsed Days: 43
Plan Fractions Treated to Date: 2
Plan Prescribed Dose Per Fraction: 2 Gy
Plan Total Fractions Prescribed: 11
Plan Total Prescribed Dose: 22 Gy
Reference Point Dosage Given to Date: 12 Gy
Reference Point Session Dosage Given: 2 Gy
Session Number: 31

## 2024-06-15 ENCOUNTER — Other Ambulatory Visit: Payer: Self-pay

## 2024-06-15 ENCOUNTER — Ambulatory Visit
Admission: RE | Admit: 2024-06-15 | Discharge: 2024-06-15 | Disposition: A | Discharge status: Home / Self Care | Source: Ambulatory Visit | Attending: Radiation Oncology

## 2024-06-15 DIAGNOSIS — C61 Malignant neoplasm of prostate: Secondary | ICD-10-CM | POA: Diagnosis not present

## 2024-06-15 DIAGNOSIS — Z510 Encounter for antineoplastic radiation therapy: Secondary | ICD-10-CM | POA: Diagnosis not present

## 2024-06-15 DIAGNOSIS — Z191 Hormone sensitive malignancy status: Secondary | ICD-10-CM | POA: Diagnosis not present

## 2024-06-15 DIAGNOSIS — R972 Elevated prostate specific antigen [PSA]: Secondary | ICD-10-CM | POA: Diagnosis not present

## 2024-06-15 LAB — RAD ONC ARIA SESSION SUMMARY
Course Elapsed Days: 46
Plan Fractions Treated to Date: 3
Plan Prescribed Dose Per Fraction: 2 Gy
Plan Total Fractions Prescribed: 11
Plan Total Prescribed Dose: 22 Gy
Reference Point Dosage Given to Date: 14 Gy
Reference Point Session Dosage Given: 2 Gy
Session Number: 32

## 2024-06-16 ENCOUNTER — Other Ambulatory Visit: Payer: Self-pay

## 2024-06-16 ENCOUNTER — Ambulatory Visit
Admission: RE | Admit: 2024-06-16 | Discharge: 2024-06-16 | Disposition: A | Discharge status: Home / Self Care | Source: Ambulatory Visit | Attending: Radiation Oncology

## 2024-06-16 DIAGNOSIS — Z510 Encounter for antineoplastic radiation therapy: Secondary | ICD-10-CM | POA: Diagnosis not present

## 2024-06-16 DIAGNOSIS — C61 Malignant neoplasm of prostate: Secondary | ICD-10-CM | POA: Diagnosis not present

## 2024-06-16 DIAGNOSIS — R972 Elevated prostate specific antigen [PSA]: Secondary | ICD-10-CM | POA: Diagnosis not present

## 2024-06-16 DIAGNOSIS — Z191 Hormone sensitive malignancy status: Secondary | ICD-10-CM | POA: Diagnosis not present

## 2024-06-16 LAB — RAD ONC ARIA SESSION SUMMARY
Course Elapsed Days: 47
Plan Fractions Treated to Date: 4
Plan Prescribed Dose Per Fraction: 2 Gy
Plan Total Fractions Prescribed: 11
Plan Total Prescribed Dose: 22 Gy
Reference Point Dosage Given to Date: 16 Gy
Reference Point Session Dosage Given: 2 Gy
Session Number: 33

## 2024-06-17 ENCOUNTER — Ambulatory Visit
Admission: RE | Admit: 2024-06-17 | Discharge: 2024-06-17 | Disposition: A | Discharge status: Home / Self Care | Source: Ambulatory Visit | Attending: Radiation Oncology | Admitting: Radiation Oncology

## 2024-06-17 ENCOUNTER — Other Ambulatory Visit: Payer: Self-pay

## 2024-06-17 DIAGNOSIS — Z191 Hormone sensitive malignancy status: Secondary | ICD-10-CM | POA: Diagnosis not present

## 2024-06-17 DIAGNOSIS — R972 Elevated prostate specific antigen [PSA]: Secondary | ICD-10-CM | POA: Diagnosis not present

## 2024-06-17 DIAGNOSIS — C61 Malignant neoplasm of prostate: Secondary | ICD-10-CM | POA: Diagnosis not present

## 2024-06-17 DIAGNOSIS — Z510 Encounter for antineoplastic radiation therapy: Secondary | ICD-10-CM | POA: Diagnosis not present

## 2024-06-17 LAB — RAD ONC ARIA SESSION SUMMARY
Course Elapsed Days: 48
Plan Fractions Treated to Date: 5
Plan Prescribed Dose Per Fraction: 2 Gy
Plan Total Fractions Prescribed: 11
Plan Total Prescribed Dose: 22 Gy
Reference Point Dosage Given to Date: 18 Gy
Reference Point Session Dosage Given: 2 Gy
Session Number: 34

## 2024-06-18 ENCOUNTER — Other Ambulatory Visit: Payer: Self-pay

## 2024-06-18 ENCOUNTER — Ambulatory Visit
Admission: RE | Admit: 2024-06-18 | Discharge: 2024-06-18 | Disposition: A | Discharge status: Home / Self Care | Source: Ambulatory Visit | Attending: Radiation Oncology | Admitting: Radiation Oncology

## 2024-06-18 DIAGNOSIS — C61 Malignant neoplasm of prostate: Secondary | ICD-10-CM | POA: Diagnosis not present

## 2024-06-18 DIAGNOSIS — Z191 Hormone sensitive malignancy status: Secondary | ICD-10-CM | POA: Diagnosis not present

## 2024-06-18 DIAGNOSIS — R972 Elevated prostate specific antigen [PSA]: Secondary | ICD-10-CM | POA: Diagnosis not present

## 2024-06-18 DIAGNOSIS — Z510 Encounter for antineoplastic radiation therapy: Secondary | ICD-10-CM | POA: Diagnosis not present

## 2024-06-18 LAB — RAD ONC ARIA SESSION SUMMARY
Course Elapsed Days: 49
Plan Fractions Treated to Date: 6
Plan Prescribed Dose Per Fraction: 2 Gy
Plan Total Fractions Prescribed: 11
Plan Total Prescribed Dose: 22 Gy
Reference Point Dosage Given to Date: 20 Gy
Reference Point Session Dosage Given: 2 Gy
Session Number: 35

## 2024-06-19 ENCOUNTER — Ambulatory Visit
Admission: RE | Admit: 2024-06-19 | Discharge: 2024-06-19 | Disposition: A | Discharge status: Home / Self Care | Source: Ambulatory Visit | Attending: Radiation Oncology

## 2024-06-19 ENCOUNTER — Other Ambulatory Visit: Payer: Self-pay

## 2024-06-19 DIAGNOSIS — C61 Malignant neoplasm of prostate: Secondary | ICD-10-CM | POA: Diagnosis not present

## 2024-06-19 DIAGNOSIS — Z191 Hormone sensitive malignancy status: Secondary | ICD-10-CM | POA: Diagnosis not present

## 2024-06-19 DIAGNOSIS — Z510 Encounter for antineoplastic radiation therapy: Secondary | ICD-10-CM | POA: Diagnosis not present

## 2024-06-19 DIAGNOSIS — R972 Elevated prostate specific antigen [PSA]: Secondary | ICD-10-CM | POA: Diagnosis not present

## 2024-06-19 LAB — RAD ONC ARIA SESSION SUMMARY
Course Elapsed Days: 50
Plan Fractions Treated to Date: 7
Plan Prescribed Dose Per Fraction: 2 Gy
Plan Total Fractions Prescribed: 11
Plan Total Prescribed Dose: 22 Gy
Reference Point Dosage Given to Date: 22 Gy
Reference Point Session Dosage Given: 2 Gy
Session Number: 36

## 2024-06-22 ENCOUNTER — Other Ambulatory Visit: Payer: Self-pay

## 2024-06-22 ENCOUNTER — Ambulatory Visit
Admission: RE | Admit: 2024-06-22 | Discharge: 2024-06-22 | Disposition: A | Discharge status: Home / Self Care | Source: Ambulatory Visit | Attending: Radiation Oncology

## 2024-06-22 DIAGNOSIS — R972 Elevated prostate specific antigen [PSA]: Secondary | ICD-10-CM | POA: Diagnosis not present

## 2024-06-22 DIAGNOSIS — Z191 Hormone sensitive malignancy status: Secondary | ICD-10-CM | POA: Diagnosis not present

## 2024-06-22 DIAGNOSIS — C61 Malignant neoplasm of prostate: Secondary | ICD-10-CM | POA: Diagnosis not present

## 2024-06-22 DIAGNOSIS — Z510 Encounter for antineoplastic radiation therapy: Secondary | ICD-10-CM | POA: Diagnosis not present

## 2024-06-22 LAB — RAD ONC ARIA SESSION SUMMARY
Course Elapsed Days: 53
Plan Fractions Treated to Date: 8
Plan Prescribed Dose Per Fraction: 2 Gy
Plan Total Fractions Prescribed: 11
Plan Total Prescribed Dose: 22 Gy
Reference Point Dosage Given to Date: 24 Gy
Reference Point Session Dosage Given: 2 Gy
Session Number: 37

## 2024-06-23 ENCOUNTER — Other Ambulatory Visit: Payer: Self-pay

## 2024-06-23 ENCOUNTER — Ambulatory Visit
Admission: RE | Admit: 2024-06-23 | Discharge: 2024-06-23 | Disposition: A | Discharge status: Home / Self Care | Source: Ambulatory Visit | Attending: Radiation Oncology | Admitting: Radiation Oncology

## 2024-06-23 DIAGNOSIS — C61 Malignant neoplasm of prostate: Secondary | ICD-10-CM | POA: Insufficient documentation

## 2024-06-23 DIAGNOSIS — Z510 Encounter for antineoplastic radiation therapy: Secondary | ICD-10-CM | POA: Diagnosis present

## 2024-06-23 DIAGNOSIS — Z191 Hormone sensitive malignancy status: Secondary | ICD-10-CM | POA: Diagnosis not present

## 2024-06-23 DIAGNOSIS — R972 Elevated prostate specific antigen [PSA]: Secondary | ICD-10-CM | POA: Diagnosis not present

## 2024-06-23 LAB — RAD ONC ARIA SESSION SUMMARY
Course Elapsed Days: 54
Plan Fractions Treated to Date: 9
Plan Prescribed Dose Per Fraction: 2 Gy
Plan Total Fractions Prescribed: 11
Plan Total Prescribed Dose: 22 Gy
Reference Point Dosage Given to Date: 26 Gy
Reference Point Session Dosage Given: 2 Gy
Session Number: 38

## 2024-06-24 ENCOUNTER — Other Ambulatory Visit: Payer: Self-pay

## 2024-06-24 ENCOUNTER — Ambulatory Visit
Admission: RE | Admit: 2024-06-24 | Discharge: 2024-06-24 | Disposition: A | Discharge status: Home / Self Care | Source: Ambulatory Visit | Attending: Radiation Oncology

## 2024-06-24 DIAGNOSIS — Z510 Encounter for antineoplastic radiation therapy: Secondary | ICD-10-CM | POA: Diagnosis not present

## 2024-06-24 DIAGNOSIS — Z191 Hormone sensitive malignancy status: Secondary | ICD-10-CM | POA: Diagnosis not present

## 2024-06-24 DIAGNOSIS — C61 Malignant neoplasm of prostate: Secondary | ICD-10-CM | POA: Diagnosis not present

## 2024-06-24 DIAGNOSIS — R972 Elevated prostate specific antigen [PSA]: Secondary | ICD-10-CM | POA: Diagnosis not present

## 2024-06-24 LAB — RAD ONC ARIA SESSION SUMMARY
Course Elapsed Days: 55
Plan Fractions Treated to Date: 10
Plan Prescribed Dose Per Fraction: 2 Gy
Plan Total Fractions Prescribed: 11
Plan Total Prescribed Dose: 22 Gy
Reference Point Dosage Given to Date: 28 Gy
Reference Point Session Dosage Given: 2 Gy
Session Number: 39

## 2024-06-25 ENCOUNTER — Other Ambulatory Visit: Payer: Self-pay

## 2024-06-25 ENCOUNTER — Ambulatory Visit

## 2024-06-25 ENCOUNTER — Ambulatory Visit
Admission: RE | Admit: 2024-06-25 | Discharge: 2024-06-25 | Disposition: A | Discharge status: Home / Self Care | Source: Ambulatory Visit | Attending: Radiation Oncology | Admitting: Radiation Oncology

## 2024-06-25 DIAGNOSIS — Z191 Hormone sensitive malignancy status: Secondary | ICD-10-CM | POA: Diagnosis not present

## 2024-06-25 DIAGNOSIS — R972 Elevated prostate specific antigen [PSA]: Secondary | ICD-10-CM | POA: Diagnosis not present

## 2024-06-25 DIAGNOSIS — C61 Malignant neoplasm of prostate: Secondary | ICD-10-CM | POA: Diagnosis not present

## 2024-06-25 DIAGNOSIS — Z510 Encounter for antineoplastic radiation therapy: Secondary | ICD-10-CM | POA: Diagnosis not present

## 2024-06-25 LAB — RAD ONC ARIA SESSION SUMMARY
Course Elapsed Days: 56
Plan Fractions Treated to Date: 11
Plan Prescribed Dose Per Fraction: 2 Gy
Plan Total Fractions Prescribed: 11
Plan Total Prescribed Dose: 22 Gy
Reference Point Dosage Given to Date: 30 Gy
Reference Point Session Dosage Given: 2 Gy
Session Number: 40

## 2024-06-26 ENCOUNTER — Ambulatory Visit

## 2024-06-29 ENCOUNTER — Ambulatory Visit

## 2024-06-29 NOTE — Radiation Completion Notes (Addendum)
  Radiation Oncology         [phone]) [phone] ________________________________  Name: [name]. MRN: [id number]  Date: 06/25/2024  DOB: August 08, 1932  Referring Physician: ARLYSS FOOT, M.D. Date of Service: 2024-06-29 Radiation Oncologist: Adina Barge, M.D. [facility]     RADIATION ONCOLOGY END OF TREATMENT NOTE     Diagnosis: 89 y.o. gentleman with Stage T1c adenocarcinoma of the prostate with Gleason score of 4+5, and PSA of 18.4   Intent: Curative     ==========DELIVERED PLANS==========  First Treatment Date: 2024-04-30 Last Treatment Date: 2024-06-25   Plan Name: Prostate_Pelv Site: Prostate Technique: IMRT Mode: Photon Dose Per Fraction: 1.8 Gy Prescribed Dose (Delivered / Prescribed): 45 Gy / 45 Gy Prescribed Fxs (Delivered / Prescribed): 25 / 25   Plan Name: Prostate_Bst Site: Prostate Technique: IMRT Mode: Photon Dose Per Fraction: 2 Gy Prescribed Dose (Delivered / Prescribed): 8 Gy / 8 Gy Prescribed Fxs (Delivered / Prescribed): 4 / 4   Plan Name: Prostate_Bst:1 Site: Prostate Technique: IMRT Mode: Photon Dose Per Fraction: 2 Gy Prescribed Dose (Delivered / Prescribed): 22 Gy / 22 Gy Prescribed Fxs (Delivered / Prescribed): 11 / 11     ==========ON TREATMENT VISIT DATES========== 2024-05-01, 2024-05-08, 2024-05-15, 2024-05-22, 2024-05-29, 2024-06-05, 2024-06-12, 2024-06-19   See weekly On Treatment Notes in Epic for details in the Media tab (listed as Progress notes on the On Treatment Visit Dates listed above).  He tolerated the radiation treatments relatively well with some increased LUTS and moderate fatigue.  The patient will receive a call in about one month from the radiation oncology department. He will continue follow up with his urologist, Dr. FOOT, as well.  ------------------------------------------------   Donnice Barge, MD [facility]  Radiation Oncology Direct Dial: [phone]  Fax:  [phone] [email or web address]  Skype  LinkedIn

## 2024-06-29 NOTE — Progress Notes (Signed)
 Patient was a RadOnc Consult on 01/14/24 for his stage T1c adenocarcinoma of the prostate with Gleason score of 4+5, and PSA of 18.4.  Patient proceed with treatment recommendations of 8 weeks of external beam radiation concurrent with LT-ADT and had his final radiation treatment on 06/25/24.   Patient is scheduled for a post treatment nurse call on 07/28/24 and has ongoing active urology appointments.

## 2024-07-13 DIAGNOSIS — I700 Atherosclerosis of aorta: Secondary | ICD-10-CM | POA: Diagnosis not present

## 2024-07-13 DIAGNOSIS — G609 Hereditary and idiopathic neuropathy, unspecified: Secondary | ICD-10-CM | POA: Diagnosis not present

## 2024-07-13 DIAGNOSIS — E1140 Type 2 diabetes mellitus with diabetic neuropathy, unspecified: Secondary | ICD-10-CM | POA: Diagnosis not present

## 2024-07-13 DIAGNOSIS — D6869 Other thrombophilia: Secondary | ICD-10-CM | POA: Diagnosis not present

## 2024-07-13 DIAGNOSIS — E1151 Type 2 diabetes mellitus with diabetic peripheral angiopathy without gangrene: Secondary | ICD-10-CM | POA: Diagnosis not present

## 2024-07-13 DIAGNOSIS — E1129 Type 2 diabetes mellitus with other diabetic kidney complication: Secondary | ICD-10-CM | POA: Diagnosis not present

## 2024-07-13 DIAGNOSIS — N1832 Chronic kidney disease, stage 3b: Secondary | ICD-10-CM | POA: Diagnosis not present

## 2024-07-13 DIAGNOSIS — I130 Hypertensive heart and chronic kidney disease with heart failure and stage 1 through stage 4 chronic kidney disease, or unspecified chronic kidney disease: Secondary | ICD-10-CM | POA: Diagnosis not present

## 2024-07-13 DIAGNOSIS — I771 Stricture of artery: Secondary | ICD-10-CM | POA: Diagnosis not present

## 2024-07-13 DIAGNOSIS — E785 Hyperlipidemia, unspecified: Secondary | ICD-10-CM | POA: Diagnosis not present

## 2024-07-17 NOTE — Progress Notes (Signed)
 RN coordinated for urology follow up at [facility].  Patient is scheduled for 10/16 at 8:30 am with Dr. Lovie.  RN spoke with patient's wife and she is aware.  No additional needs at this time.

## 2024-07-28 ENCOUNTER — Ambulatory Visit
Admission: RE | Admit: 2024-07-28 | Discharge: 2024-07-28 | Disposition: A | Discharge status: Home / Self Care | Source: Ambulatory Visit | Attending: Internal Medicine | Admitting: Internal Medicine

## 2024-07-28 DIAGNOSIS — C61 Malignant neoplasm of prostate: Secondary | ICD-10-CM | POA: Insufficient documentation

## 2024-07-28 NOTE — Progress Notes (Addendum)
 Post Treatment Telephone Note  Diagnosis:  Prostate Cancer, Stage IIIC (cT1c, cN0, cM0, PSA:  18.4, Grade Group:  5)  The patient was available for call today.   Symptoms of fatigue have improved since completing therapy.  [name] reports some fatigue but is still active and takes naps as needed.  Reports staying hydrated.  Reports hot flashes occasional  (Orgovyx).  Symptoms of bladder changes have improved since completing therapy. Reports some frequency and nocturia x 3.  Denies hematuria or dysuria at this time. Current symptoms include some frequency and nocturia x3. No medications for bladder symptoms.  Symptoms of bowel changes have improved since completing therapy.   Post Treatment IPSS Score:  8 IPSS Questionnaire (AUA-7): Over the past month.   1)  How often have you had a sensation of not emptying your bladder completely after you finish urinating?  0 - Not at all  2)  How often have you had to urinate again less than two hours after you finished urinating? 2 - Less than half the time  3)  How often have you found you stopped and started again several times when you urinated?  0 - Not at all  4) How difficult have you found it to postpone urination?  0 - Not at all  5) How often have you had a weak urinary stream?  3 - About half the time  6) How often have you had to push or strain to begin urination?  0 - Not at all  7) How many times did you most typically get up to urinate from the time you went to bed until the time you got up in the morning?  3 - 3 times  Total score:  8. Which indicates moderate symptoms  0-7 mildly symptomatic   8-19 moderately symptomatic   20-35 severely symptomatic   Patient (has a scheduled follow up visit with his urologist, Dr. Arlyss Foot, on 10/08/2024 at 8:30 am. He was counseled that PSA levels will be drawn in the urology office, and was reassured that additional time is expected to improve bowel and bladder symptoms. He was encouraged  to call back with concerns or questions regarding radiation.

## 2024-08-04 ENCOUNTER — Other Ambulatory Visit: Payer: Self-pay | Admitting: Urology

## 2024-08-04 DIAGNOSIS — C61 Malignant neoplasm of prostate: Secondary | ICD-10-CM

## 2024-10-08 DIAGNOSIS — C61 Malignant neoplasm of prostate: Secondary | ICD-10-CM | POA: Diagnosis not present

## 2024-10-08 DIAGNOSIS — R972 Elevated prostate specific antigen [PSA]: Secondary | ICD-10-CM | POA: Diagnosis not present

## 2024-10-19 DIAGNOSIS — E1151 Type 2 diabetes mellitus with diabetic peripheral angiopathy without gangrene: Secondary | ICD-10-CM | POA: Diagnosis not present

## 2024-10-19 DIAGNOSIS — I129 Hypertensive chronic kidney disease with stage 1 through stage 4 chronic kidney disease, or unspecified chronic kidney disease: Secondary | ICD-10-CM | POA: Diagnosis not present

## 2024-10-19 DIAGNOSIS — Z1389 Encounter for screening for other disorder: Secondary | ICD-10-CM | POA: Diagnosis not present

## 2024-10-19 DIAGNOSIS — N1832 Chronic kidney disease, stage 3b: Secondary | ICD-10-CM | POA: Diagnosis not present

## 2024-10-19 DIAGNOSIS — Z125 Encounter for screening for malignant neoplasm of prostate: Secondary | ICD-10-CM | POA: Diagnosis not present

## 2024-10-19 DIAGNOSIS — E785 Hyperlipidemia, unspecified: Secondary | ICD-10-CM | POA: Diagnosis not present

## 2024-10-27 DIAGNOSIS — I129 Hypertensive chronic kidney disease with stage 1 through stage 4 chronic kidney disease, or unspecified chronic kidney disease: Secondary | ICD-10-CM | POA: Diagnosis not present

## 2024-10-27 DIAGNOSIS — D6869 Other thrombophilia: Secondary | ICD-10-CM | POA: Diagnosis not present

## 2024-10-27 DIAGNOSIS — N1831 Chronic kidney disease, stage 3a: Secondary | ICD-10-CM | POA: Diagnosis not present

## 2024-10-27 DIAGNOSIS — N1832 Chronic kidney disease, stage 3b: Secondary | ICD-10-CM | POA: Diagnosis not present

## 2024-10-27 DIAGNOSIS — E1140 Type 2 diabetes mellitus with diabetic neuropathy, unspecified: Secondary | ICD-10-CM | POA: Diagnosis not present

## 2024-10-27 DIAGNOSIS — I480 Paroxysmal atrial fibrillation: Secondary | ICD-10-CM | POA: Diagnosis not present

## 2024-10-27 DIAGNOSIS — Z23 Encounter for immunization: Secondary | ICD-10-CM | POA: Diagnosis not present

## 2024-10-27 DIAGNOSIS — Z0000 Encounter for general adult medical examination without abnormal findings: Secondary | ICD-10-CM | POA: Diagnosis not present

## 2024-10-27 DIAGNOSIS — E785 Hyperlipidemia, unspecified: Secondary | ICD-10-CM | POA: Diagnosis not present

## 2024-10-27 DIAGNOSIS — Z1339 Encounter for screening examination for other mental health and behavioral disorders: Secondary | ICD-10-CM | POA: Diagnosis not present

## 2024-10-27 DIAGNOSIS — Z1331 Encounter for screening for depression: Secondary | ICD-10-CM | POA: Diagnosis not present

## 2024-10-27 DIAGNOSIS — E1129 Type 2 diabetes mellitus with other diabetic kidney complication: Secondary | ICD-10-CM | POA: Diagnosis not present

## 2024-10-29 DIAGNOSIS — R82998 Other abnormal findings in urine: Secondary | ICD-10-CM | POA: Diagnosis not present

## 2024-11-23 ENCOUNTER — Encounter: Payer: Self-pay | Admitting: *Deleted

## 2024-12-21 ENCOUNTER — Ambulatory Visit (INDEPENDENT_AMBULATORY_CARE_PROVIDER_SITE_OTHER)

## 2024-12-21 ENCOUNTER — Encounter: Payer: Self-pay | Admitting: Podiatry

## 2024-12-21 ENCOUNTER — Ambulatory Visit (INDEPENDENT_AMBULATORY_CARE_PROVIDER_SITE_OTHER): Admitting: Podiatry

## 2024-12-21 DIAGNOSIS — M7751 Other enthesopathy of right foot: Secondary | ICD-10-CM | POA: Diagnosis not present

## 2024-12-21 DIAGNOSIS — M7752 Other enthesopathy of left foot: Secondary | ICD-10-CM | POA: Diagnosis not present

## 2024-12-21 MED ORDER — TRIAMCINOLONE ACETONIDE 10 MG/ML IJ SUSP
10.0000 mg | Freq: Once | INTRAMUSCULAR | Status: AC
  Administered 2024-12-21: 10 mg via INTRA_ARTICULAR

## 2024-12-21 NOTE — Progress Notes (Signed)
 Subjective:   Patient ID: [name]., male   DOB: 89 y.o.   MRN: [id number]   HPI Patient presents with pain in the ankles of both feet and also has tingling of both his feet and mild swelling with patient 89 years old and with caregiver   ROS      Objective:  Physical Exam  Neurovascular status diminished but intact for his age with discomfort and pain of the sinus tarsi bilateral with fluid buildup and pain with pressure.  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

## 2025-01-14 ENCOUNTER — Encounter: Payer: Self-pay | Admitting: *Deleted

## 2025-01-14 NOTE — Progress Notes (Signed)
 SCP summary completed and mailed to pt.

## 2025-01-18 ENCOUNTER — Inpatient Hospital Stay: Admitting: *Deleted

## 2025-01-19 ENCOUNTER — Encounter: Payer: Self-pay | Admitting: *Deleted

## 2025-01-19 NOTE — Progress Notes (Signed)
 Pt's wife called saying pt was complaining of headache. After reviewing meds this nurse asked pt had he taken bp medication, he said no. I had his wife to take blood pressure and it was elevated both times 180/90 being the max. I then instructed wife to have him take amlodipine  10 mg and Tylenol  2- 325 mg. I also told wife to increase his water intake after she said he hardly ever drinks water. I explained to her dehydration can be a sign of headaches too. She will recheck his bp in an hour and call me back and then I will re-assess his symptoms. Pt called back in an hour after taking the blood pressure medication and Tylenol  for headache. BP was 172/100. I then instructed pt to call PCP. PCP scheduled a visit with pt on 1/28 and added a bedtime medication(telmisartan) for bp and added as need medication(hydralazine ) if bp is 170/100.

## 2025-01-21 ENCOUNTER — Inpatient Hospital Stay: Admitting: *Deleted

## 2025-01-21 ENCOUNTER — Encounter: Payer: Self-pay | Admitting: *Deleted

## 2025-01-21 DIAGNOSIS — C61 Malignant neoplasm of prostate: Secondary | ICD-10-CM

## 2025-01-21 NOTE — Progress Notes (Addendum)
 SCP reviewed and completed. Follow-up Bp is 154/70. A copy of summary sent to PCP.

## 2025-01-22 ENCOUNTER — Emergency Department (HOSPITAL_COMMUNITY)

## 2025-01-22 ENCOUNTER — Other Ambulatory Visit: Payer: Self-pay

## 2025-01-22 ENCOUNTER — Inpatient Hospital Stay (HOSPITAL_COMMUNITY)
Admission: EM | Admit: 2025-01-22 | Discharge: 2025-01-28 | DRG: 480 | Disposition: A | Discharge status: Rehabilitation Facility | Attending: Internal Medicine | Admitting: Internal Medicine

## 2025-01-22 ENCOUNTER — Encounter (HOSPITAL_COMMUNITY): Payer: Self-pay

## 2025-01-22 DIAGNOSIS — F32A Depression, unspecified: Secondary | ICD-10-CM | POA: Diagnosis present

## 2025-01-22 DIAGNOSIS — R7989 Other specified abnormal findings of blood chemistry: Secondary | ICD-10-CM | POA: Diagnosis present

## 2025-01-22 DIAGNOSIS — Z87891 Personal history of nicotine dependence: Secondary | ICD-10-CM

## 2025-01-22 DIAGNOSIS — Z7984 Long term (current) use of oral hypoglycemic drugs: Secondary | ICD-10-CM

## 2025-01-22 DIAGNOSIS — E559 Vitamin D deficiency, unspecified: Secondary | ICD-10-CM | POA: Diagnosis present

## 2025-01-22 DIAGNOSIS — N400 Enlarged prostate without lower urinary tract symptoms: Secondary | ICD-10-CM | POA: Diagnosis present

## 2025-01-22 DIAGNOSIS — Z87442 Personal history of urinary calculi: Secondary | ICD-10-CM

## 2025-01-22 DIAGNOSIS — Z8249 Family history of ischemic heart disease and other diseases of the circulatory system: Secondary | ICD-10-CM

## 2025-01-22 DIAGNOSIS — E1122 Type 2 diabetes mellitus with diabetic chronic kidney disease: Secondary | ICD-10-CM | POA: Diagnosis present

## 2025-01-22 DIAGNOSIS — I2510 Atherosclerotic heart disease of native coronary artery without angina pectoris: Secondary | ICD-10-CM | POA: Diagnosis present

## 2025-01-22 DIAGNOSIS — M25552 Pain in left hip: Secondary | ICD-10-CM

## 2025-01-22 DIAGNOSIS — H9190 Unspecified hearing loss, unspecified ear: Secondary | ICD-10-CM | POA: Diagnosis present

## 2025-01-22 DIAGNOSIS — J690 Pneumonitis due to inhalation of food and vomit: Secondary | ICD-10-CM | POA: Diagnosis present

## 2025-01-22 DIAGNOSIS — E875 Hyperkalemia: Secondary | ICD-10-CM | POA: Diagnosis present

## 2025-01-22 DIAGNOSIS — E7800 Pure hypercholesterolemia, unspecified: Secondary | ICD-10-CM | POA: Diagnosis present

## 2025-01-22 DIAGNOSIS — N1832 Chronic kidney disease, stage 3b: Secondary | ICD-10-CM | POA: Diagnosis present

## 2025-01-22 DIAGNOSIS — G7000 Myasthenia gravis without (acute) exacerbation: Secondary | ICD-10-CM | POA: Diagnosis present

## 2025-01-22 DIAGNOSIS — I129 Hypertensive chronic kidney disease with stage 1 through stage 4 chronic kidney disease, or unspecified chronic kidney disease: Secondary | ICD-10-CM | POA: Diagnosis present

## 2025-01-22 DIAGNOSIS — Z79899 Other long term (current) drug therapy: Secondary | ICD-10-CM

## 2025-01-22 DIAGNOSIS — C61 Malignant neoplasm of prostate: Secondary | ICD-10-CM | POA: Diagnosis present

## 2025-01-22 DIAGNOSIS — Z7901 Long term (current) use of anticoagulants: Secondary | ICD-10-CM

## 2025-01-22 DIAGNOSIS — K529 Noninfective gastroenteritis and colitis, unspecified: Secondary | ICD-10-CM | POA: Diagnosis present

## 2025-01-22 DIAGNOSIS — Y92009 Unspecified place in unspecified non-institutional (private) residence as the place of occurrence of the external cause: Secondary | ICD-10-CM

## 2025-01-22 DIAGNOSIS — Z1152 Encounter for screening for COVID-19: Secondary | ICD-10-CM

## 2025-01-22 DIAGNOSIS — R509 Fever, unspecified: Secondary | ICD-10-CM | POA: Diagnosis present

## 2025-01-22 DIAGNOSIS — W19XXXA Unspecified fall, initial encounter: Secondary | ICD-10-CM

## 2025-01-22 DIAGNOSIS — R5381 Other malaise: Secondary | ICD-10-CM | POA: Diagnosis present

## 2025-01-22 DIAGNOSIS — Z8669 Personal history of other diseases of the nervous system and sense organs: Secondary | ICD-10-CM

## 2025-01-22 DIAGNOSIS — Z86711 Personal history of pulmonary embolism: Secondary | ICD-10-CM | POA: Diagnosis present

## 2025-01-22 DIAGNOSIS — S72012A Unspecified intracapsular fracture of left femur, initial encounter for closed fracture: Principal | ICD-10-CM | POA: Diagnosis present

## 2025-01-22 DIAGNOSIS — W010XXA Fall on same level from slipping, tripping and stumbling without subsequent striking against object, initial encounter: Secondary | ICD-10-CM | POA: Diagnosis present

## 2025-01-22 DIAGNOSIS — H18419 Arcus senilis, unspecified eye: Secondary | ICD-10-CM | POA: Diagnosis present

## 2025-01-22 DIAGNOSIS — R55 Syncope and collapse: Principal | ICD-10-CM | POA: Diagnosis present

## 2025-01-22 DIAGNOSIS — Z886 Allergy status to analgesic agent status: Secondary | ICD-10-CM

## 2025-01-22 DIAGNOSIS — E119 Type 2 diabetes mellitus without complications: Secondary | ICD-10-CM

## 2025-01-22 DIAGNOSIS — Z8546 Personal history of malignant neoplasm of prostate: Secondary | ICD-10-CM

## 2025-01-22 DIAGNOSIS — Z86718 Personal history of other venous thrombosis and embolism: Secondary | ICD-10-CM

## 2025-01-22 DIAGNOSIS — I493 Ventricular premature depolarization: Secondary | ICD-10-CM | POA: Diagnosis not present

## 2025-01-22 DIAGNOSIS — K219 Gastro-esophageal reflux disease without esophagitis: Secondary | ICD-10-CM | POA: Diagnosis present

## 2025-01-22 DIAGNOSIS — S72002A Fracture of unspecified part of neck of left femur, initial encounter for closed fracture: Secondary | ICD-10-CM

## 2025-01-22 LAB — CBC
HCT: 31.4 % — ABNORMAL LOW (ref 39.0–52.0)
Hemoglobin: 10.4 g/dL — ABNORMAL LOW (ref 13.0–17.0)
MCH: 31.5 pg (ref 26.0–34.0)
MCHC: 33.1 g/dL (ref 30.0–36.0)
MCV: 95.2 fL (ref 80.0–100.0)
Platelets: 244 10*3/uL (ref 150–400)
RBC: 3.3 MIL/uL — ABNORMAL LOW (ref 4.22–5.81)
RDW: 15.7 % — ABNORMAL HIGH (ref 11.5–15.5)
WBC: 4.7 10*3/uL (ref 4.0–10.5)
nRBC: 0 % (ref 0.0–0.2)

## 2025-01-22 LAB — URINALYSIS, ROUTINE W REFLEX MICROSCOPIC
Bilirubin Urine: NEGATIVE
Glucose, UA: NEGATIVE mg/dL
Hgb urine dipstick: NEGATIVE
Ketones, ur: NEGATIVE mg/dL
Leukocytes,Ua: NEGATIVE
Nitrite: NEGATIVE
Protein, ur: >=300 mg/dL — AB
Specific Gravity, Urine: 1.015 (ref 1.005–1.030)
pH: 5 (ref 5.0–8.0)

## 2025-01-22 LAB — CBG MONITORING, ED: Glucose-Capillary: 107 mg/dL — ABNORMAL HIGH (ref 70–99)

## 2025-01-22 MED ORDER — OXYCODONE-ACETAMINOPHEN 5-325 MG PO TABS
1.0000 | ORAL_TABLET | Freq: Once | ORAL | Status: AC
  Administered 2025-01-22: 1 via ORAL
  Filled 2025-01-22: qty 1

## 2025-01-22 NOTE — ED Provider Notes (Signed)
 "  EMERGENCY DEPARTMENT AT [facility] Provider Note   CSN: [id number] Arrival date & time: 01/22/25  2024     Patient presents with: Altered Mental Status   [name]. is a 89 y.o. male with high blood pressure, prostate cancer, myasthenia gravis, PE on Xarelto , type 2 diabetes, presenting to the ED with complaint for near syncope and fall.  Patient's wife reports that he was doing fine this morning, that he did eat breakfast and dinner.  The patient said he felt okay this morning as well.  At some point after dinner, his wife heard a thud and found the patient was lying on the ground next to the table.  The patient reports he just got woozy headed when he tried to stand up and he fell over.  He denies striking his head.  He does report pain in his left hip and says he landed onto his left hip.  He was unable to get up afterwards because my muscles feel really weak.  His wife reports that the patient dragged himself over to the edge of the cast and then he vomited and he was sweating.  {Add pertinent medical, surgical, social history, OB history to HPI:32947} HPI     Prior to Admission medications  Medication Sig Start Date End Date Taking? Authorizing Provider  albuterol  (VENTOLIN  HFA) 108 (90 Base) MCG/ACT inhaler Inhale 2 puffs into the lungs every 6 (six) hours as needed for wheezing. 03/04/20   [provider]  amLODipine  (NORVASC ) 10 MG tablet Take 1 tablet (10 mg total) by mouth daily. 11/29/21   Regalado, Belkys A, MD  Ascorbic Acid (VITAMIN C PO) Take 1 tablet by mouth See admin instructions. Take one tablet by mouth with iron (ferrous sulfate) - occasionally    [provider]  Blood Glucose Monitoring Suppl (ONETOUCH VERIO FLEX SYSTEM) w/Device KIT Use to test BS twice daily 10/21/23   [provider]  FERROUS SULFATE PO Take 1 tablet by mouth See admin instructions. Take one tablet by mouth with vitamin c - occasionally     [provider]  glucose blood (ONETOUCH VERIO) test strip 1 strip In Vitro Twice a day 10/21/23   [provider]  hydrALAZINE  (APRESOLINE ) 10 MG tablet Take 10 mg by mouth as needed (only take as needed if bp is 170/100.). 01/19/25   [provider]  metFORMIN (GLUCOPHAGE-XR) 500 MG 24 hr tablet Take 500 mg by mouth 2 (two) times daily. 08/23/23   [provider]  ORGOVYX 120 MG tablet Take 120 mg by mouth daily. 04/06/24   [provider]  pantoprazole  (PROTONIX ) 40 MG tablet Take 1 tablet (40 mg total) by mouth 2 (two) times daily before a meal. 11/28/21   Regalado, Belkys A, MD  polyethylene glycol (MIRALAX  / GLYCOLAX ) 17 g packet Take 17 g by mouth 2 (two) times daily. 11/28/21   Regalado, Belkys A, MD  potassium chloride (KLOR-CON) 10 MEQ tablet Take 10 mEq by mouth daily. 09/11/23   [provider]  pravastatin  (PRAVACHOL ) 20 MG tablet Take 20 mg by mouth every morning.    [provider]  telmisartan (MICARDIS) 20 MG tablet Take 20 mg by mouth daily. Pt is taking at bedtime. 01/20/25   [provider]  Tetrahydrozoline HCl (VISINE OP) Place 1 drop into both eyes daily as needed (dry eyes). Patient not taking: Reported on 01/21/2025    [provider]  TRELEGY ELLIPTA 100-62.5-25 MCG/INH AEPB Inhale  1 puff into the lungs daily as needed (shortness of breath/wheezing). 04/12/21   [provider]  XARELTO  20 MG TABS tablet Take 20 mg by mouth every morning. 05/29/21   [provider]    Allergies: Aspirin and Nsaids    Review of Systems  Updated Vital Signs BP (!) 147/74 (BP Location: Left Arm)   Pulse 80   Temp 98 F (36.7 C) (Oral)   Resp 16   Ht 5' 11 (1.803 m)   Wt 79.4 kg   SpO2 91%   BMI 24.41 kg/m   Physical Exam Constitutional:      General: He is not in acute distress. HENT:     Head: Normocephalic and atraumatic.  Eyes:     Conjunctiva/sclera: Conjunctivae normal.      Pupils: Pupils are equal, round, and reactive to light.  Cardiovascular:     Rate and Rhythm: Normal rate and regular rhythm.  Pulmonary:     Effort: Pulmonary effort is normal. No respiratory distress.  Abdominal:     General: There is no distension.     Tenderness: There is no abdominal tenderness.  Musculoskeletal:     Comments: Patient does have some pain and discomfort with range of motion testing of the left hip, no pelvic instability  Skin:    General: Skin is warm and dry.  Neurological:     General: No focal deficit present.     Mental Status: He is alert. Mental status is at baseline.  Psychiatric:        Mood and Affect: Mood normal.        Behavior: Behavior normal.     (all labs ordered are listed, but only abnormal results are displayed) Labs Reviewed  CBC - Abnormal; Notable for the following components:      Result Value   RBC 3.30 (*)    Hemoglobin 10.4 (*)    HCT 31.4 (*)    RDW 15.7 (*)    All other components within normal limits  CBG MONITORING, ED - Abnormal; Notable for the following components:   Glucose-Capillary 107 (*)    All other components within normal limits  RESP PANEL BY RT-PCR (RSV, FLU A&B, COVID)  RVPGX2  URINALYSIS, ROUTINE W REFLEX MICROSCOPIC  COMPREHENSIVE METABOLIC PANEL WITH GFR  TROPONIN T, HIGH SENSITIVITY    EKG: None  Radiology: No results found.  {Document cardiac monitor, telemetry assessment procedure when appropriate:32947} Procedures   Medications Ordered in the ED  oxyCODONE -acetaminophen  (PERCOCET/ROXICET) 5-325 MG per tablet 1 tablet (1 tablet Oral Given 01/22/25 2240)      {Click here for ABCD2, HEART and other calculators REFRESH Note before signing:1}                              Medical Decision Making Amount and/or Complexity of Data Reviewed Labs: ordered. Radiology: ordered. ECG/medicine tests: ordered.  Risk Prescription drug management.   Patient is here with likely near syncope episode.   Vital signs unremarkable on arrival.  Differential would include vasovagal episode versus orthostatic hypotension versus dehydration versus arrhythmia versus metabolic derangement atypical ACS versus viral syndrome including COVID-19 or influenza versus  Patient is also having left hip pain after fall, will need imaging, will also consider imaging of the head it is not clear whether he truly did or did not hit his head.  He is on Xarelto .  Patient is slow to speak, able to answer questions  with some difficulty.  His wife says his memory is usually sharp.  There may be some questionable change in his cognition but not significantly off of baseline.  His spouse also respond slowly to questions.  Questionably poor historians, low threshold for full syncope evaluation and trauma evaluation.  Supplemental history is provided by the patient's family at bedside  Patient's risk factor includes advanced age and Xarelto  use, concern for osteoporosis, risk for fracture and bleeding  Patient is pending labs, imaging, viral testing and urine at the time of sign out to Dr Midge EDP at 11 pm  {Document critical care time when appropriate  Document review of labs and clinical decision tools ie [id number], etc  Document your independent review of radiology images and any outside records  Document your discussion with family members, caretakers and with consultants  Document social determinants of health affecting pt's care  Document your decision making why or why not admission, treatments were needed:32947:::1}   Final diagnoses:  Near syncope  Left hip pain    ED Discharge Orders     None        "

## 2025-01-22 NOTE — ED Notes (Signed)
 Lab called RN on Hemolyzed CMP and Troponin. This was the 2nd collection from this RN that has hemolyzed. RN has attempted to call lab tech to assist with collection.

## 2025-01-22 NOTE — ED Triage Notes (Signed)
 Pt BIBA from home s/p syncopal episode. Per pt spouse pt was eating, stood up and then had syncopal episode. When he woke up he was altered. Pt did not hit head per EMS. LKW 1915. Unsure of pt baseline mentation per EMS as pt spouse poor historian. Pt awake, alert, oriented to self, place and situation, disoriented to time

## 2025-01-22 NOTE — ED Notes (Signed)
 MD Wickline aware of hemolyzed labs and this RN just completed a straight stick to collect troponin and cmp

## 2025-01-23 ENCOUNTER — Other Ambulatory Visit: Payer: Self-pay

## 2025-01-23 ENCOUNTER — Emergency Department (HOSPITAL_COMMUNITY)

## 2025-01-23 DIAGNOSIS — C61 Malignant neoplasm of prostate: Secondary | ICD-10-CM

## 2025-01-23 DIAGNOSIS — Z86711 Personal history of pulmonary embolism: Secondary | ICD-10-CM

## 2025-01-23 DIAGNOSIS — R509 Fever, unspecified: Secondary | ICD-10-CM | POA: Diagnosis present

## 2025-01-23 DIAGNOSIS — E875 Hyperkalemia: Secondary | ICD-10-CM | POA: Diagnosis present

## 2025-01-23 DIAGNOSIS — W19XXXA Unspecified fall, initial encounter: Secondary | ICD-10-CM

## 2025-01-23 DIAGNOSIS — R7989 Other specified abnormal findings of blood chemistry: Secondary | ICD-10-CM | POA: Diagnosis present

## 2025-01-23 DIAGNOSIS — Y92009 Unspecified place in unspecified non-institutional (private) residence as the place of occurrence of the external cause: Secondary | ICD-10-CM

## 2025-01-23 DIAGNOSIS — E119 Type 2 diabetes mellitus without complications: Secondary | ICD-10-CM

## 2025-01-23 DIAGNOSIS — R55 Syncope and collapse: Principal | ICD-10-CM | POA: Diagnosis present

## 2025-01-23 DIAGNOSIS — Z8669 Personal history of other diseases of the nervous system and sense organs: Secondary | ICD-10-CM

## 2025-01-23 DIAGNOSIS — S72012A Unspecified intracapsular fracture of left femur, initial encounter for closed fracture: Secondary | ICD-10-CM | POA: Diagnosis present

## 2025-01-23 LAB — RESP PANEL BY RT-PCR (RSV, FLU A&B, COVID)  RVPGX2
Influenza A by PCR: NEGATIVE
Influenza B by PCR: NEGATIVE
Resp Syncytial Virus by PCR: NEGATIVE
SARS Coronavirus 2 by RT PCR: NEGATIVE

## 2025-01-23 LAB — TROPONIN T, HIGH SENSITIVITY: Troponin T High Sensitivity: 48 ng/L — ABNORMAL HIGH (ref 0–19)

## 2025-01-23 LAB — COMPREHENSIVE METABOLIC PANEL WITH GFR
ALT: 15 U/L (ref 0–44)
AST: 20 U/L (ref 15–41)
Albumin: 3.8 g/dL (ref 3.5–5.0)
Alkaline Phosphatase: 102 U/L (ref 38–126)
Anion gap: 14 (ref 5–15)
BUN: 29 mg/dL — ABNORMAL HIGH (ref 8–23)
CO2: 21 mmol/L — ABNORMAL LOW (ref 22–32)
Calcium: 9.6 mg/dL (ref 8.9–10.3)
Chloride: 104 mmol/L (ref 98–111)
Creatinine, Ser: 1.66 mg/dL — ABNORMAL HIGH (ref 0.61–1.24)
GFR, Estimated: 39 mL/min — ABNORMAL LOW
Glucose, Bld: 131 mg/dL — ABNORMAL HIGH (ref 70–99)
Potassium: 5.2 mmol/L — ABNORMAL HIGH (ref 3.5–5.1)
Sodium: 139 mmol/L (ref 135–145)
Total Bilirubin: 0.4 mg/dL (ref 0.0–1.2)
Total Protein: 6.7 g/dL (ref 6.5–8.1)

## 2025-01-23 LAB — RESPIRATORY PANEL BY PCR

## 2025-01-23 LAB — HEPARIN LEVEL (UNFRACTIONATED): Heparin Unfractionated: 0.48 [IU]/mL (ref 0.30–0.70)

## 2025-01-23 LAB — BASIC METABOLIC PANEL WITH GFR
Anion gap: 10 (ref 5–15)
BUN: 32 mg/dL — ABNORMAL HIGH (ref 8–23)
CO2: 23 mmol/L (ref 22–32)
Calcium: 9.3 mg/dL (ref 8.9–10.3)
Chloride: 105 mmol/L (ref 98–111)
Creatinine, Ser: 1.86 mg/dL — ABNORMAL HIGH (ref 0.61–1.24)
GFR, Estimated: 34 mL/min — ABNORMAL LOW
Glucose, Bld: 120 mg/dL — ABNORMAL HIGH (ref 70–99)
Potassium: 5.9 mmol/L — ABNORMAL HIGH (ref 3.5–5.1)
Sodium: 138 mmol/L (ref 135–145)

## 2025-01-23 LAB — I-STAT CG4 LACTIC ACID, ED: Lactic Acid, Venous: 1.3 mmol/L (ref 0.5–1.9)

## 2025-01-23 LAB — CK: Total CK: 136 U/L (ref 49–397)

## 2025-01-23 LAB — APTT: aPTT: 64 s — ABNORMAL HIGH (ref 24–36)

## 2025-01-23 LAB — LIPASE, BLOOD: Lipase: 44 U/L (ref 11–51)

## 2025-01-23 LAB — PROCALCITONIN: Procalcitonin: 0.65 ng/mL

## 2025-01-23 MED ORDER — METHOCARBAMOL 500 MG PO TABS
500.0000 mg | ORAL_TABLET | Freq: Four times a day (QID) | ORAL | Status: DC | PRN
  Administered 2025-01-26: 500 mg via ORAL
  Filled 2025-01-23: qty 1

## 2025-01-23 MED ORDER — ACETAMINOPHEN 325 MG PO TABS
650.0000 mg | ORAL_TABLET | Freq: Once | ORAL | Status: AC
  Administered 2025-01-23: 650 mg via ORAL
  Filled 2025-01-23: qty 2

## 2025-01-23 MED ORDER — DOCUSATE SODIUM 100 MG PO CAPS
100.0000 mg | ORAL_CAPSULE | Freq: Two times a day (BID) | ORAL | Status: DC
  Administered 2025-01-23 – 2025-01-28 (×10): 100 mg via ORAL
  Filled 2025-01-23 (×10): qty 1

## 2025-01-23 MED ORDER — HYDROCODONE-ACETAMINOPHEN 5-325 MG PO TABS
1.0000 | ORAL_TABLET | Freq: Four times a day (QID) | ORAL | Status: DC | PRN
  Administered 2025-01-26 – 2025-01-28 (×4): 1 via ORAL
  Filled 2025-01-23 (×4): qty 1

## 2025-01-23 MED ORDER — FENTANYL CITRATE (PF) 50 MCG/ML IJ SOSY
50.0000 ug | PREFILLED_SYRINGE | Freq: Once | INTRAMUSCULAR | Status: AC
  Administered 2025-01-23: 50 ug via INTRAVENOUS
  Filled 2025-01-23: qty 1

## 2025-01-23 MED ORDER — SODIUM CHLORIDE 0.9 % IV SOLN: 1.0000 g | Freq: Once | INTRAVENOUS | Status: DC

## 2025-01-23 MED ORDER — HEPARIN (PORCINE) 25000 UT/250ML-% IV SOLN
1350.0000 [IU]/h | INTRAVENOUS | Status: DC
  Administered 2025-01-23: 1250 [IU]/h via INTRAVENOUS
  Filled 2025-01-23: qty 250

## 2025-01-23 MED ORDER — METHOCARBAMOL 1000 MG/10ML IJ SOLN: 500.0000 mg | Freq: Four times a day (QID) | INTRAMUSCULAR | Status: DC | PRN

## 2025-01-23 MED ORDER — IOHEXOL 350 MG/ML SOLN
75.0000 mL | Freq: Once | INTRAVENOUS | Status: AC | PRN
  Administered 2025-01-23: 75 mL via INTRAVENOUS

## 2025-01-23 MED ORDER — SODIUM CHLORIDE 0.9 % IV SOLN: INTRAVENOUS | Status: DC

## 2025-01-23 MED ORDER — METRONIDAZOLE 500 MG/100ML IV SOLN
500.0000 mg | Freq: Two times a day (BID) | INTRAVENOUS | Status: DC
  Administered 2025-01-23 – 2025-01-28 (×10): 500 mg via INTRAVENOUS
  Filled 2025-01-23 (×11): qty 100

## 2025-01-23 MED ORDER — SODIUM ZIRCONIUM CYCLOSILICATE 10 G PO PACK
10.0000 g | PACK | Freq: Once | ORAL | Status: AC
  Administered 2025-01-23: 10 g via ORAL
  Filled 2025-01-23: qty 1

## 2025-01-23 MED ORDER — MORPHINE SULFATE (PF) 2 MG/ML IV SOLN: 0.5000 mg | INTRAVENOUS | Status: DC | PRN

## 2025-01-23 MED ORDER — SODIUM CHLORIDE 0.9 % IV BOLUS (SEPSIS)
1000.0000 mL | Freq: Once | INTRAVENOUS | Status: AC
  Administered 2025-01-23: 1000 mL via INTRAVENOUS

## 2025-01-23 MED ORDER — ONDANSETRON HCL 4 MG/2ML IJ SOLN
4.0000 mg | Freq: Once | INTRAMUSCULAR | Status: AC
  Administered 2025-01-23: 4 mg via INTRAVENOUS
  Filled 2025-01-23: qty 2

## 2025-01-23 MED ORDER — SODIUM CHLORIDE 0.9 % IV SOLN
2.0000 g | INTRAVENOUS | Status: DC
  Administered 2025-01-23 – 2025-01-28 (×6): 2 g via INTRAVENOUS
  Filled 2025-01-23 (×6): qty 20

## 2025-01-23 NOTE — Progress Notes (Signed)
 ANTICOAGULATION CONSULT NOTE  Pharmacy Consult for Heparin  Indication: Hx of PE and malignancy  Allergies[1]  Patient Measurements: Height: 5' 11 (180.3 cm) Weight: 79.4 kg (175 lb) IBW/kg (Calculated) : 75.3 Heparin  Dosing Weight: 79.4 kg  Vital Signs: Temp: 99.7 F (37.6 C) (01/31 2012) Temp Source: Oral (01/31 2012) BP: 171/81 (01/31 2012) Pulse Rate: 84 (01/31 2012)  Labs: Recent Labs    01/22/25 2142 01/22/25 2355 01/23/25 0838 01/23/25 2040  HGB 10.4*  --   --   --   HCT 31.4*  --   --   --   PLT 244  --   --   --   APTT  --   --   --  64*  CREATININE  --  1.66* 1.86*  --   CKTOTAL  --   --  136  --     Estimated Creatinine Clearance: 28.1 mL/min (A) (by C-G formula based on SCr of 1.86 mg/dL (H)).   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   Unknown   amLODipine  (NORVASC ) 10 MG tablet Take 1 tablet (10 mg total) by mouth daily. 30 tablet 3 01/22/2025   Ascorbic Acid (VITAMIN C PO) Take 1 tablet by mouth See admin instructions. Take one tablet by mouth with iron (ferrous sulfate) - occasionally   01/22/2025   FERROUS SULFATE PO Take 1 tablet by mouth See admin instructions. Take one tablet by mouth with vitamin c - occasionally    01/22/2025   hydrALAZINE  (APRESOLINE ) 10 MG tablet Take 10 mg by mouth as needed (only take as needed if bp is 170/100.).   Past Week   metFORMIN (GLUCOPHAGE-XR) 500 MG 24 hr tablet Take 500 mg by mouth in the morning and at bedtime.   Past Week   ORGOVYX 120 MG tablet Take 120 mg by mouth daily.   Past Week   pantoprazole  (PROTONIX ) 40 MG tablet Take 1 tablet (40 mg total) by mouth 2 (two) times daily before a meal. (Patient taking differently: Take 40 mg by mouth daily.) 60 tablet 0 01/22/2025   polyethylene glycol (MIRALAX  / GLYCOLAX ) 17 g packet Take 17 g by mouth 2 (two) times daily. 14 each 0 Unknown   potassium chloride (KLOR-CON) 10 MEQ tablet Take 10 mEq by mouth daily.   01/22/2025   pravastatin  (PRAVACHOL ) 20 MG tablet Take 20 mg by mouth every morning.   01/22/2025   telmisartan (MICARDIS) 20 MG tablet Take 20 mg by mouth at bedtime.   Past Week   TRELEGY ELLIPTA 100-62.5-25 MCG/INH AEPB Inhale 1 puff into the lungs daily as needed (shortness of breath/wheezing).   Past Week   XARELTO   20 MG TABS tablet Take 20 mg by mouth every morning.   01/22/2025 Morning   Scheduled:   docusate sodium   100 mg Oral BID   Infusions:   sodium chloride  75 mL/hr at 01/23/25 1052   cefTRIAXone  (ROCEPHIN )  IV Stopped (01/23/25 1104)   heparin  1,250 Units/hr (01/23/25 1604)   metronidazole  500 mg (01/23/25 2025)   PRN: HYDROcodone -acetaminophen , methocarbamol  **OR** methocarbamol  (ROBAXIN ) injection, morphine  injection  Assessment: 89 yom with a history of HTN, HLD, ocular myasthenia gravis, hx of PE on Xarelto , T2DM, CKD, prostate cancer, BPH, GERD. Patient is presenting with fall. Femur fracture noted in the ED. Heparin  per pharmacy consult placed for Hx of PE and malignancy.  Patient is on Xarelto  prior to arrival. Last dose 1/30 AM per med rec. Will require aPTT monitoring due to likely falsely high anti-Xa level secondary to DOAC use.  Hgb 10.4; plt 244  PM: aPTT 64 is slightly subtherapeutic on  1250 units/hr. No issues with the infusion or bleeding reported.  Goal of Therapy:  Heparin  level 0.3-0.7 units/ml aPTT 66-102 seconds Monitor platelets by anticoagulation protocol: Yes   Plan:  Increase heparin  infusion at 1350 units/hr Check aPTT & anti-Xa level in 8 hours and daily while on heparin  Continue to monitor via aPTT until levels are correlated Continue to monitor H&H and platelets  Rocky Slade, PharmD, BCPS 01/23/2025 9:10 PM  Please check AMION for all [facility] phone numbers After 10:00 PM, call Main Pharmacy [phone]         [1]  Allergies Allergen Reactions   Aspirin Other (See Comments)    REACTION: full strength; causes gi irritation   Nsaids     Other Reaction(s): Not available

## 2025-01-23 NOTE — H&P (Addendum)
 " History and Physical    Patient: [name]. FMW:[id number] DOB: 02/10/1932 DOA: 01/22/2025 DOS: the patient was seen and examined on 01/23/2025 PCP: Shepard Ade, MD  Patient coming from: Home via EMS  Chief Complaint:  Chief Complaint  Patient presents with   Altered Mental Status   HPI: [name]. is a 89 y.o. male with medical history significant of hypertension, hyperlipidemia, ocular myasthenia gravis, history of PE on Xarelto , diabetes mellitus type 2, chronic kidney disease stage IIIb, prostate cancer, BPH, and GERD presents with a fall resulting in left hip pain and blackout. He is accompanied by his daughter-in-law.  He experienced a fall after standing up and blacking out, landing on his left side, which resulted in significant pain in the left hip. The pain is persistent, especially when moving into certain positions.  On Monday, he experienced a severe headache accompanied by ear and lower neck pain. He took Tylenol , but it did not alleviate the headache. He also experienced nausea and vomiting after eating soup yesterday, which improved after vomiting.  He has a history of prostate cancer, for which he underwent forty treatments, and kidney stones. He is currently on Xarelto  for anticoagulation due to a past pulmonary embolism and takes a new blood pressure medication as needed.  In the emergency department patient was noted to be febrile up to 101.2 F with respirations 13-23, blood pressures elevated up to 168/92, and O2 saturations noted to be as low as 91% which he was placed on 2 L nasal cannula oxygen.  Labs noted  WBC 4.7, hemoglobin 10.4, potassium 5.2, BUN 26, creatinine 1.66, high-sensitivity troponin 48, and lactic acid 1.3.  CT scan of the head did not reveal any acute abnormality.  CT scan of the hip revealed acute transverse oblique subcapital proximal left femoral fracture with slight impaction, left inguinal hernia containing nonobstructed loop of  small bowel, and new fiduciary markers in the prostate.  CT angiogram of the chest, abdomen, pelvis was obtained noting no pulmonary embolism, mild bladder wall thickening versus underdistention, thickened wall versus underdistention of the ascending and proximal transverse colon possibly reflecting colitis, and bronchitis with scattered subsegmental lower lobe bronchial impactions without infiltrates.  Urinalysis showed no significant signs for infection.  Influenza, COVID-19, and RSV screening were negative.  Blood cultures were obtained.  Patient had been given 1 L normal saline IV fluids, Zofran , fentanyl  50 mcg IV, and oxycodone  5 mg.  Dr. Kendal of orthopedics was consulted and had initial plans for surgical repair.  Review of Systems: As mentioned in the history of present illness. All other systems reviewed and are negative. 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 His smoking use included cigarettes. He  has quit using smokeless tobacco.  His smokeless tobacco use included chew. He reports that he does not drink alcohol  and does not use drugs.  Allergies[1]  Family History  Problem Relation Age of Onset   Heart attack Father    Sarcoidosis Brother     Prior to Admission medications  Medication Sig Start Date End Date Taking? Authorizing Provider  albuterol  (VENTOLIN  HFA) 108 (90 Base) MCG/ACT inhaler Inhale 2 puffs into the lungs every 6 (six) hours as needed for wheezing. 03/04/20   [provider]  amLODipine  (NORVASC ) 10 MG tablet Take 1 tablet (10 mg total) by mouth daily. 11/29/21   Regalado, Belkys A, MD  Ascorbic Acid (VITAMIN C PO) Take 1 tablet by mouth See admin instructions. Take one tablet by mouth with iron (ferrous sulfate) - occasionally    [provider]  Blood Glucose Monitoring Suppl (ONETOUCH VERIO FLEX SYSTEM) w/Device KIT Use to test BS twice daily 10/21/23   [provider]  FERROUS SULFATE PO Take 1 tablet by mouth See admin instructions. Take one tablet by mouth with vitamin c - occasionally    [provider]  glucose blood (ONETOUCH VERIO) test strip 1 strip In Vitro Twice a day 10/21/23   [provider]  hydrALAZINE  (APRESOLINE ) 10 MG tablet Take 10 mg by mouth as needed (only take as needed if bp is 170/100.). 01/19/25   [provider]  metFORMIN (GLUCOPHAGE-XR) 500 MG 24 hr tablet Take 500 mg by mouth 2 (two) times daily. 08/23/23   [provider]  ORGOVYX 120 MG tablet Take 120 mg by mouth daily. 04/06/24   [provider]  pantoprazole  (PROTONIX ) 40 MG tablet Take 1 tablet (40 mg total) by mouth 2 (two) times daily before a meal. 11/28/21   Regalado, Belkys A, MD  polyethylene glycol (MIRALAX  / GLYCOLAX ) 17 g packet Take 17 g by mouth 2 (two) times daily. 11/28/21   Regalado, Belkys A, MD  potassium chloride (KLOR-CON) 10 MEQ tablet Take 10 mEq by mouth daily. 09/11/23   [provider]   pravastatin  (PRAVACHOL ) 20 MG tablet Take 20 mg by mouth every morning.    [provider]  telmisartan (MICARDIS) 20 MG tablet Take 20 mg by mouth daily. Pt is taking at bedtime. 01/20/25   [provider]  Tetrahydrozoline HCl (VISINE OP) Place 1 drop into both eyes daily as needed (dry eyes). Patient not taking: Reported on 01/21/2025    [provider]  TRELEGY ELLIPTA 100-62.5-25 MCG/INH AEPB Inhale 1 puff into the lungs daily as needed (shortness of breath/wheezing). 04/12/21   [provider]  XARELTO  20 MG TABS tablet Take 20 mg by mouth every morning. 05/29/21   [provider]    Physical Exam: Vitals:   01/23/25 0230 01/23/25 0358 01/23/25 0500 01/23/25 0600  BP: (!) 147/67 115/60 131/76 125/79  Pulse: 90 88 81 71  Resp: 15 18 13    Temp: (!) 100.4 F (38 C) 99.2 F (37.3 C) 99 F (37.2 C) 99.1 F (37.3 C)  TempSrc: Oral Oral Oral Oral  SpO2:  98% 95% 97% 98%  Weight:      Height:          Constitutional: Elderly male currently in no acute distress Eyes: PERRL, lids and conjunctivae normal ENMT: Mucous membranes are moist. Posterior pharynx clear of any exudate or lesions.Normal dentition.  Neck: normal, supple,  Respiratory: clear to auscultation bilaterally, no wheezing, no crackles. Normal respiratory effort. No accessory muscle use.  Cardiovascular: Regular rate and rhythm, no murmurs / rubs / gallops. No extremity edema. 2+ pedal pulses. No carotid bruits.  Abdomen: no tenderness, no masses palpated. No hepatosplenomegaly. Bowel sounds positive.  Musculoskeletal: no clubbing / cyanosis.  Left leg slightly externally rotated and straighten with tenderness to palpation of the left hip Skin: no rashes, lesions, ulcers. No induration Neurologic: CN 2-12 grossly intact.   Strength 5/5 in all 4.  Psychiatric: Mild decreased recent memory. Alert and oriented x 3. Normal mood.   Data Reviewed:  EKG reveals sinus rhythm at 90 bpm.   Reviewed labs, imaging, and pertinent records  Assessment and Plan:  Left subcapital femur fracture secondary to fall Patient presents after passing out and falling on his left hip.  CT of the hip revealed a acute transverse oblique subcapital proximal left femoral fracture with slight impaction. - Admit to a telemetry bed - Hip fracture order set utilized - N.p.o. for possible need of procedure in a.m. - Hydrocodone /morphine  IV as needed for moderate to severe pain - Colace - Appreciate orthopedic consultative services, will follow-up for any further recommendations  Fever  Patient noted to be febrile up to 101.2 F with some mild tachypnea.  CT imaging noted thickened wall versus underdistention of the ascending and proximal transverse colon possibly reflecting colitis, and bronchitis with scattered subsegmental lower lobe bronchial impactions without infiltrates.  Urinalysis did not show any significant signs for infection.  Influenza, COVID-19, and RSV screening were negative.  2 sets of blood cultures were obtained.  Question respiratory virus / bacterial infection versus colitis. - Check complete respiratory virus panel and procalcitonin - Give empiric antibiotics of Rocephin     Syncope and collapse He reportedly had stood up after eating and passed out.  Suspect orthostatic hypotension as a likely cause with possible recent illness. - Follow-up telemetry - May warrant further work up  Elevated troponin Acute on chronic.  High-sensitivity troponin elevated at 48. - Continue to trend cardiac troponin  Hyperkalemia Acute.  Potassium elevated at 5.2.  Patient has been given 1 L normal saline IV fluids. - Recheck potassium this morning (5.9) - Give additional Lokelma  10 g p.o. - Continue IV fluids at 75 mL/h for 1 day  Controlled diabetes mellitus type 2 On admission glucose noted to be 131.  Last available hemoglobin A1c 6.1. - Continue to monitor  Chronic kidney disease  stage IIIb Creatinine noted to be 1.66 with BUN 29.  Baseline creatinine appears to range from 1.4-1.8  History of pulmonary embolism Patient is on Xarelto  with last dose being yesterday. -  Held Xarelto  for need of procedure - Heparin  drip per pharmacy  History of ocular myasthenia gravis - Continue to monitor  Prostate cancer Patient just recently underwent some procedure. - Continue outpatient follow-up  DVT prophylaxis: Heparin  Advance Care Planning:   Code Status: Full Code    Consults: Orthopedic surgery  Family Communication: Family updated at bedside  Severity of Illness: The appropriate patient status for this patient is INPATIENT. Inpatient status is judged to be reasonable and necessary in order to  provide the required intensity of service to ensure the patient's safety. The patient's presenting symptoms, physical exam findings, and initial radiographic and laboratory data in the context of their chronic comorbidities is felt to place them at high risk for further clinical deterioration. Furthermore, it is not anticipated that the patient will be medically stable for discharge from the hospital within 2 midnights of admission.   * I certify that at the point of admission it is my clinical judgment that the patient will require inpatient hospital care spanning beyond 2 midnights from the point of admission due to high intensity of service, high risk for further deterioration and high frequency of surveillance required.*  Author: Maximino DELENA Sharps, MD 01/23/2025 7:40 AM  For on call review [email or web address].      [1]  Allergies Allergen Reactions   Aspirin Other (See Comments)    REACTION: full strength; causes gi irritation   Nsaids     Other Reaction(s): Not available   "

## 2025-01-23 NOTE — ED Provider Notes (Signed)
 D/w Dr Maximino Sharps Will admit He requests IV rocephin  for possible colitis  Pt stabilized/improved in the ED   Midge Golas, MD 01/23/25 [phone]

## 2025-01-23 NOTE — Consult Note (Signed)
 Orthopaedic Trauma Service (OTS) Consult   Patient ID: [name]. MRN: [id number] DOB/AGE: July 08, 1932 89 y.o.  Reason for Consult: Left nondisplaced femoral neck fracture Referring Physician:  Dr. Midge, MD [facility]  HPI: Earle Troiano. is an 89 y.o. male who is being seen in consultation at the request of Dr. Midge for evaluation of left nondisplaced femoral neck fracture.  Patient had a ground-level fall at the above injury.  Patient had a near syncopal episode.  He has been dealing with fever and falls.  He landed on his hip.  CT scan showed a nondisplaced femoral neck fracture.  Patient was consulted for evaluation.  Initially had him on the surgery schedule for today but unfortunately due to his fever it was felt to delay his surgery.  Patient was seen and evaluated at bedside with his wife.  Currently comfortable not having much pain in his hip unless he moves it.  Past Medical History:  Diagnosis Date   Aortic atherosclerosis    Chest pain    Chronic kidney disease (CKD), stage II (mild)    Coronary atherosclerosis    DM2 (diabetes mellitus, type 2) (HCC)    Elevated PSA    GERD (gastroesophageal reflux disease)    Hydronephrosis    on left   Hyperlipidemia    Hypertension    Iliac artery aneurysm    Kidney stone    Myasthenia gravis (HCC) 03/29/2017   Ocular myasthenia gravis (HCC)    Ophthalmoplegia 2009   Diplopia-intranuclear   Osteoarthritis    Pulmonary embolism (HCC)     Past Surgical History:  Procedure Laterality Date   GOLD SEED IMPLANT N/A 04/17/2024   Procedure: INSERTION, GOLD SEEDS;  Surgeon: Elisabeth Valli BIRCH, MD;  Location: [facility] OR;  Service: Urology;  Laterality: N/A;   KNEE SURGERY     LITHOTRIPSY     PROSTATE BIOPSY     PROSTATE SURGERY     SPACE OAR INSTILLATION N/A 04/17/2024   Procedure: INJECTION, HYDROGEL SPACER;  Surgeon: Elisabeth Valli BIRCH, MD;  Location: [facility] OR;  Service: Urology;  Laterality: N/A;    Family History  Problem  Relation Age of Onset   Heart attack Father    Sarcoidosis Brother     Social History:  reports that he has quit smoking. His smoking use included cigarettes. He has quit using smokeless tobacco.  His smokeless tobacco use included chew. He reports that he does not drink alcohol  and does not use drugs.  Allergies: Allergies[1]  Medications: Medications Ordered Prior to Encounter[2]   ROS: As above  Exam: Blood pressure (!) 164/71, pulse 79, temperature 99.6 F (37.6 C), temperature source Oral, resp. rate 16, height 5' 11 (1.803 m), weight 79.4 kg, SpO2 93%. General: No acute distress Orientation: Awake alert and oriented Mood and Affect: Cooperative and pleasant Gait: Unable to assess Coordination and balance: Within normal limits  Left lower extremity: Leg lengths are symmetric.  No obvious wounds or deformity.  Compartments are soft compressible.  Motor and sensory function is intact.  He is able to move his hip decently without significant discomfort or pain.  Right lower extremity: Skin without lesions. No tenderness to palpation. Full painless ROM, full strength in each muscle groups without evidence of instability.   Medical Decision Making: Data: Imaging: X-rays and CT scan show some pre-existing arthritic changes in his left hip with a nondisplaced left femoral neck fracture.  Labs:  Results for orders placed or performed during the hospital  encounter of 01/22/25 (from the past 24 hours)  CBC     Status: Abnormal   Collection Time: 01/22/25  9:42 PM  Result Value Ref Range   WBC 4.7 4.0 - 10.5 K/uL   RBC 3.30 (L) 4.22 - 5.81 MIL/uL   Hemoglobin 10.4 (L) 13.0 - 17.0 g/dL   HCT 68.5 (L) 60.9 - 47.9 %   MCV 95.2 80.0 - 100.0 fL   MCH 31.5 26.0 - 34.0 pg   MCHC 33.1 30.0 - 36.0 g/dL   RDW 84.2 (H) 88.4 - 84.4 %   Platelets 244 150 - 400 K/uL   nRBC 0.0 0.0 - 0.2 %  Urinalysis, Routine w reflex microscopic -Urine, Clean Catch     Status: Abnormal   Collection Time:  01/22/25  9:42 PM  Result Value Ref Range   Color, Urine YELLOW YELLOW   APPearance CLEAR CLEAR   Specific Gravity, Urine 1.015 1.005 - 1.030   pH 5.0 5.0 - 8.0   Glucose, UA NEGATIVE NEGATIVE mg/dL   Hgb urine dipstick NEGATIVE NEGATIVE   Bilirubin Urine NEGATIVE NEGATIVE   Ketones, ur NEGATIVE NEGATIVE mg/dL   Protein, ur >=699 (A) NEGATIVE mg/dL   Nitrite NEGATIVE NEGATIVE   Leukocytes,Ua NEGATIVE NEGATIVE   RBC / HPF 0-5 0 - 5 RBC/hpf   WBC, UA 0-5 0 - 5 WBC/hpf   Bacteria, UA RARE (A) NONE SEEN   Squamous Epithelial / HPF 0-5 0 - 5 /HPF   Mucus PRESENT    Hyaline Casts, UA PRESENT   CBG monitoring, ED     Status: Abnormal   Collection Time: 01/22/25  9:50 PM  Result Value Ref Range   Glucose-Capillary 107 (H) 70 - 99 mg/dL  Resp panel by RT-PCR (RSV, Flu A&B, Covid) Urine, Clean Catch     Status: None   Collection Time: 01/22/25 10:41 PM   Specimen: Urine, Clean Catch; Nasal Swab  Result Value Ref Range   SARS Coronavirus 2 by RT PCR NEGATIVE NEGATIVE   Influenza A by PCR NEGATIVE NEGATIVE   Influenza B by PCR NEGATIVE NEGATIVE   Resp Syncytial Virus by PCR NEGATIVE NEGATIVE  Comprehensive metabolic panel with GFR     Status: Abnormal   Collection Time: 01/22/25 11:55 PM  Result Value Ref Range   Sodium 139 135 - 145 mmol/L   Potassium 5.2 (H) 3.5 - 5.1 mmol/L   Chloride 104 98 - 111 mmol/L   CO2 21 (L) 22 - 32 mmol/L   Glucose, Bld 131 (H) 70 - 99 mg/dL   BUN 29 (H) 8 - 23 mg/dL   Creatinine, Ser 8.33 (H) 0.61 - 1.24 mg/dL   Calcium  9.6 8.9 - 10.3 mg/dL   Total Protein 6.7 6.5 - 8.1 g/dL   Albumin 3.8 3.5 - 5.0 g/dL   AST 20 15 - 41 U/L   ALT 15 0 - 44 U/L   Alkaline Phosphatase 102 38 - 126 U/L   Total Bilirubin 0.4 0.0 - 1.2 mg/dL   GFR, Estimated 39 (L) >60 mL/min   Anion gap 14 5 - 15  Troponin T, High Sensitivity     Status: Abnormal   Collection Time: 01/22/25 11:55 PM  Result Value Ref Range   Troponin T High Sensitivity 48 (H) 0 - 19 ng/L  Lipase,  blood     Status: None   Collection Time: 01/22/25 11:55 PM  Result Value Ref Range   Lipase 44 11 - 51 U/L  Blood Culture (routine  x 2)     Status: None (Preliminary result)   Collection Time: 01/23/25  1:25 AM   Specimen: BLOOD RIGHT HAND  Result Value Ref Range   Specimen Description BLOOD RIGHT HAND    Special Requests      BOTTLES DRAWN AEROBIC AND ANAEROBIC Blood Culture results may not be optimal due to an inadequate volume of blood received in culture bottles   Culture      NO GROWTH < 12 HOURS Performed at [facility], [address] [place], [place] [address]    Report Status PENDING   I-Stat Lactic Acid, ED     Status: None   Collection Time: 01/23/25  1:34 AM  Result Value Ref Range   Lactic Acid, Venous 1.3 0.5 - 1.9 mmol/L  Blood Culture (routine x 2)     Status: None (Preliminary result)   Collection Time: 01/23/25  2:27 AM   Specimen: BLOOD LEFT HAND  Result Value Ref Range   Specimen Description BLOOD LEFT HAND    Special Requests      BOTTLES DRAWN AEROBIC ONLY Blood Culture results may not be optimal due to an inadequate volume of blood received in culture bottles   Culture      NO GROWTH < 12 HOURS Performed at [facility], [address] [place], [place] [address]    Report Status PENDING   Procalcitonin     Status: None   Collection Time: 01/23/25  8:38 AM  Result Value Ref Range   Procalcitonin 0.65 ng/mL  CK     Status: None   Collection Time: 01/23/25  8:38 AM  Result Value Ref Range   Total CK 136 49 - 397 U/L  Basic metabolic panel     Status: Abnormal   Collection Time: 01/23/25  8:38 AM  Result Value Ref Range   Sodium 138 135 - 145 mmol/L   Potassium 5.9 (H) 3.5 - 5.1 mmol/L   Chloride 105 98 - 111 mmol/L   CO2 23 22 - 32 mmol/L   Glucose, Bld 120 (H) 70 - 99 mg/dL   BUN 32 (H) 8 - 23 mg/dL   Creatinine, Ser 8.13 (H) 0.61 - 1.24 mg/dL   Calcium  9.3 8.9 - 10.3 mg/dL   GFR, Estimated 34 (L) >60 mL/min   Anion gap 10 5 -  15  Respiratory (~20 pathogens) panel by PCR     Status: None   Collection Time: 01/23/25  8:58 AM   Specimen: Nasopharyngeal Swab; Respiratory  Result Value Ref Range   Adenovirus NOT DETECTED NOT DETECTED   Coronavirus 229E NOT DETECTED NOT DETECTED   Coronavirus HKU1 NOT DETECTED NOT DETECTED   Coronavirus NL63 NOT DETECTED NOT DETECTED   Coronavirus OC43 NOT DETECTED NOT DETECTED   Metapneumovirus NOT DETECTED NOT DETECTED   Rhinovirus / Enterovirus NOT DETECTED NOT DETECTED   Influenza A NOT DETECTED NOT DETECTED   Influenza B NOT DETECTED NOT DETECTED   Parainfluenza Virus 1 NOT DETECTED NOT DETECTED   Parainfluenza Virus 2 NOT DETECTED NOT DETECTED   Parainfluenza Virus 3 NOT DETECTED NOT DETECTED   Parainfluenza Virus 4 NOT DETECTED NOT DETECTED   Respiratory Syncytial Virus NOT DETECTED NOT DETECTED   Bordetella pertussis NOT DETECTED NOT DETECTED   Bordetella Parapertussis NOT DETECTED NOT DETECTED   Chlamydophila pneumoniae NOT DETECTED NOT DETECTED   Mycoplasma pneumoniae NOT DETECTED NOT DETECTED     Imaging or Labs ordered: None  Medical history and  chart was reviewed and case discussed with medical provider.  Assessment/Plan: 89 year old male with a left nondisplaced femoral neck fracture.  Will plan to proceed with surgery Monday morning.  We allow for clearance of his infection.  Risks and benefits were discussed with the patient and his wife.  Risks include but not limited to bleeding, infection, malunion, nonunion, hardware failure, hardware rotation, nerve and blood vessel injury, need for further surgery including total hip Arthur placement.  They agreed to proceed with surgery and consent was obtained.  Surgery w/ risks or Emergency surgery: High complexity Risk (Level 5)   Franky MYRTIS Light, MD [facility] [phone] (office) [email or web address]      [1]  Allergies Allergen Reactions   Aspirin Other (See Comments)    REACTION:  full strength; causes gi irritation   Nsaids     Other Reaction(s): Not available  [2]  No current facility-administered medications on file prior to encounter.   Current Outpatient Medications on File Prior to Encounter  Medication Sig Dispense Refill   albuterol  (VENTOLIN  HFA) 108 (90 Base) MCG/ACT inhaler Inhale 2 puffs into the lungs every 6 (six) hours as needed for wheezing.     amLODipine  (NORVASC ) 10 MG tablet Take 1 tablet (10 mg total) by mouth daily. 30 tablet 3   Ascorbic Acid (VITAMIN C PO) Take 1 tablet by mouth See admin instructions. Take one tablet by mouth with iron (ferrous sulfate) - occasionally     FERROUS SULFATE PO Take 1 tablet by mouth See admin instructions. Take one tablet by mouth with vitamin c - occasionally     hydrALAZINE  (APRESOLINE ) 10 MG tablet Take 10 mg by mouth as needed (only take as needed if bp is 170/100.).     metFORMIN (GLUCOPHAGE-XR) 500 MG 24 hr tablet Take 500 mg by mouth in the morning and at bedtime.     ORGOVYX 120 MG tablet Take 120 mg by mouth daily.     pantoprazole  (PROTONIX ) 40 MG tablet Take 1 tablet (40 mg total) by mouth 2 (two) times daily before a meal. (Patient taking differently: Take 40 mg by mouth daily.) 60 tablet 0   polyethylene glycol (MIRALAX  / GLYCOLAX ) 17 g packet Take 17 g by mouth 2 (two) times daily. 14 each 0   potassium chloride (KLOR-CON) 10 MEQ tablet Take 10 mEq by mouth daily.     pravastatin  (PRAVACHOL ) 20 MG tablet Take 20 mg by mouth every morning.     telmisartan (MICARDIS) 20 MG tablet Take 20 mg by mouth at bedtime.     TRELEGY ELLIPTA 100-62.5-25 MCG/INH AEPB Inhale 1 puff into the lungs daily as needed (shortness of breath/wheezing).     XARELTO  20 MG TABS tablet Take 20 mg by mouth every morning.

## 2025-01-23 NOTE — Plan of Care (Signed)
  Problem: Education: Goal: Knowledge of General Education information will improve Description: Including pain rating scale, medication(s)/side effects and non-pharmacologic comfort measures Outcome: Progressing   Problem: Activity: Goal: Risk for activity intolerance will decrease Outcome: Progressing   Problem: Pain Managment: Goal: General experience of comfort will improve and/or be controlled Outcome: Progressing

## 2025-01-23 NOTE — Progress Notes (Signed)
 ANTICOAGULATION CONSULT NOTE  Pharmacy Consult for Heparin  Indication: Hx of PE and malignancy  Allergies[1]  Patient Measurements: Height: 5' 11 (180.3 cm) Weight: 79.4 kg (175 lb) IBW/kg (Calculated) : 75.3 Heparin  Dosing Weight: 79.4 kg  Vital Signs: Temp: 98.2 F (36.8 C) (01/31 0831) Temp Source: Oral (01/31 0600) BP: 126/75 (01/31 1245) Pulse Rate: 81 (01/31 1245)  Labs: Recent Labs    01/22/25 2142 01/22/25 2355 01/23/25 0838  HGB 10.4*  --   --   HCT 31.4*  --   --   PLT 244  --   --   CREATININE  --  1.66* 1.86*  CKTOTAL  --   --  136    Estimated Creatinine Clearance: 28.1 mL/min (A) (by C-G formula based on SCr of 1.86 mg/dL (H)).   Medical History: Past Medical History:  Diagnosis Date   Aortic atherosclerosis    Chest pain    Chronic kidney disease (CKD), stage II (mild)    Coronary atherosclerosis    DM2 (diabetes mellitus, type 2) (HCC)    Elevated PSA    GERD (gastroesophageal reflux disease)    Hydronephrosis    on left   Hyperlipidemia    Hypertension    Iliac artery aneurysm    Kidney stone    Myasthenia gravis (HCC) 03/29/2017   Ocular myasthenia gravis (HCC)    Ophthalmoplegia 2009   Diplopia-intranuclear   Osteoarthritis    Pulmonary embolism (HCC)     Medications:  (Not in a hospital admission)  Scheduled:   docusate sodium   100 mg Oral BID   Infusions:   sodium chloride  75 mL/hr at 01/23/25 1052   cefTRIAXone  (ROCEPHIN )  IV Stopped (01/23/25 1104)   metronidazole  Stopped (01/23/25 1104)   PRN: HYDROcodone -acetaminophen , methocarbamol  **OR** methocarbamol  (ROBAXIN ) injection, morphine  injection  Assessment: 89 yom with a history of HTN, HLD, ocular myasthenia gravis, hx of PE on Xarelto , T2DM, CKD, prostate cancer, BPH, GERD. Patient is presenting with fall. Femur fracture noted in the ED. Heparin  per pharmacy consult placed for Hx of PE and malignancy.  Patient is on Xarelto  prior to arrival. Last dose 1/30 AM per  med rec. Will require aPTT monitoring due to likely falsely high anti-Xa level secondary to DOAC use.  Hgb 10.4; plt 244  Goal of Therapy:  Heparin  level 0.3-0.7 units/ml aPTT 66-102 seconds Monitor platelets by anticoagulation protocol: Yes   Plan:  No initial heparin  bolus Start heparin  infusion at 1250 units/hr Check aPTT & anti-Xa level in 8 hours and daily while on heparin  Continue to monitor via aPTT until levels are correlated Continue to monitor H&H and platelets  Dorn Buttner, PharmD, BCPS 01/23/2025 12:47 PM ED Clinical Pharmacist -  [phone]       [1]  Allergies Allergen Reactions   Aspirin Other (See Comments)    REACTION: full strength; causes gi irritation   Nsaids     Other Reaction(s): Not available

## 2025-01-23 NOTE — ED Provider Notes (Signed)
 When I went to evaluate the patient, he was actively vomiting and tachycardic. Oral temp is now 100.8 Repeat EKG reveals sinus tachycardia with a heart rate of 139 but no other acute findings Patient reports feeling improved after vomiting.  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  Will obtain CT chest to evaluate for PE, as well as CT abdomen pelvis will need to be admitted Viral panel negative.  Urinalysis negative   Midge Golas, MD 01/23/25 [phone]

## 2025-01-23 NOTE — ED Provider Notes (Signed)
 Discussed CT findings with Dr. Kendal Patient's last dose of Xarelto  was yesterday morning He will have the orthopedic team see patient for his hip fracture  Overall patient is improving, vitals are improved.  No obvious cause on CT imaging of the chest and abdomen though possible ?Colitis Patient will be admitted to the hospitalist service  .Critical Care  Performed by: Midge Golas, MD Authorized by: Midge Golas, MD   Critical care provider statement:    Critical care time (minutes):  60   Critical care start time:  01/23/2025 5:00 AM   Critical care end time:  01/23/2025 6:00 AM   Critical care time was exclusive of:  Separately billable procedures and treating other patients   Critical care was necessary to treat or prevent imminent or life-threatening deterioration of the following conditions:  Sepsis, shock and dehydration   Critical care was time spent personally by me on the following activities:  Obtaining history from patient or surrogate, examination of patient, evaluation of patient's response to treatment, ordering and review of laboratory studies, ordering and review of radiographic studies, pulse oximetry and re-evaluation of patient's condition   I assumed direction of critical care for this patient from another provider in my specialty: yes       Midge Golas, MD 01/23/25 [phone]

## 2025-01-23 NOTE — Progress Notes (Signed)
 Ortho Note  Patient with nondisplaced femoral neck fracture. Multiple medical issues currently being stabilized. I have posted him for percutaneous fixation of his hip later this AM. Hopefully will be able to proceed today. Formal consult to follow later this AM.  Franky MYRTIS Light, MD [facility] [phone] (office) [email or web address]

## 2025-01-24 LAB — HEPARIN LEVEL (UNFRACTIONATED)
Heparin Unfractionated: 1.02 [IU]/mL — ABNORMAL HIGH (ref 0.30–0.70)
Heparin Unfractionated: >1.1 [IU]/mL — ABNORMAL HIGH (ref 0.30–0.70)
Heparin Unfractionated: 0.89 [IU]/mL — ABNORMAL HIGH (ref 0.30–0.70)

## 2025-01-24 LAB — CBC
HCT: 28.3 % — ABNORMAL LOW (ref 39.0–52.0)
Hemoglobin: 9.4 g/dL — ABNORMAL LOW (ref 13.0–17.0)
MCH: 31 pg (ref 26.0–34.0)
MCHC: 33.2 g/dL (ref 30.0–36.0)
MCV: 93.4 fL (ref 80.0–100.0)
Platelets: 198 10*3/uL (ref 150–400)
RBC: 3.03 MIL/uL — ABNORMAL LOW (ref 4.22–5.81)
RDW: 16.1 % — ABNORMAL HIGH (ref 11.5–15.5)
WBC: 5.8 10*3/uL (ref 4.0–10.5)
nRBC: 0 % (ref 0.0–0.2)

## 2025-01-24 LAB — SURGICAL PCR SCREEN
MRSA, PCR: NEGATIVE
Staphylococcus aureus: NEGATIVE

## 2025-01-24 LAB — BASIC METABOLIC PANEL WITH GFR
Anion gap: 9 (ref 5–15)
BUN: 26 mg/dL — ABNORMAL HIGH (ref 8–23)
CO2: 24 mmol/L (ref 22–32)
Calcium: 9.1 mg/dL (ref 8.9–10.3)
Chloride: 107 mmol/L (ref 98–111)
Creatinine, Ser: 1.6 mg/dL — ABNORMAL HIGH (ref 0.61–1.24)
GFR, Estimated: 41 mL/min — ABNORMAL LOW
Glucose, Bld: 90 mg/dL (ref 70–99)
Potassium: 5 mmol/L (ref 3.5–5.1)
Sodium: 139 mmol/L (ref 135–145)

## 2025-01-24 LAB — APTT
aPTT: 194 s (ref 24–36)
aPTT: 180 s (ref 24–36)
aPTT: 127 s — ABNORMAL HIGH (ref 24–36)

## 2025-01-24 MED ORDER — HYDRALAZINE HCL 10 MG PO TABS: 10.0000 mg | ORAL_TABLET | ORAL | Status: DC | PRN

## 2025-01-24 MED ORDER — PRAVASTATIN SODIUM 40 MG PO TABS
20.0000 mg | ORAL_TABLET | Freq: Every morning | ORAL | Status: DC
  Administered 2025-01-24 – 2025-01-27 (×4): 20 mg via ORAL
  Filled 2025-01-24 (×5): qty 1

## 2025-01-24 MED ORDER — HEPARIN (PORCINE) 25000 UT/250ML-% IV SOLN
1000.0000 [IU]/h | INTRAVENOUS | Status: DC
  Administered 2025-01-24: 1150 [IU]/h via INTRAVENOUS
  Filled 2025-01-24: qty 250

## 2025-01-24 MED ORDER — HYDRALAZINE HCL 10 MG PO TABS
10.0000 mg | ORAL_TABLET | ORAL | Status: DC | PRN
  Administered 2025-01-24 – 2025-01-25 (×2): 10 mg via ORAL
  Filled 2025-01-24 (×4): qty 1

## 2025-01-24 MED ORDER — IRBESARTAN 150 MG PO TABS
75.0000 mg | ORAL_TABLET | Freq: Every day | ORAL | Status: DC
  Administered 2025-01-24 – 2025-01-28 (×5): 75 mg via ORAL
  Filled 2025-01-24 (×5): qty 1

## 2025-01-24 NOTE — Progress Notes (Signed)
 ANTICOAGULATION CONSULT NOTE  Pharmacy Consult for Heparin  Indication: Hx of PE and malignancy  Allergies[1]  Patient Measurements: Height: 5' 11 (180.3 cm) Weight: 79.4 kg (175 lb) IBW/kg (Calculated) : 75.3 Heparin  Dosing Weight: 79.4 kg  Vital Signs: Temp: 98.9 F (37.2 C) (02/01 0854) Temp Source: Oral (02/01 0854) BP: 146/86 (02/01 0854) Pulse Rate: 80 (02/01 0854)  Labs: Recent Labs    01/22/25 2142 01/22/25 2355 01/23/25 0838 01/23/25 2040 01/24/25 0557 01/24/25 1055  HGB 10.4*  --   --   --  9.4*  --   HCT 31.4*  --   --   --  28.3*  --   PLT 244  --   --   --  198  --   APTT  --   --   --  64* 194* 180*  HEPARINUNFRC  --   --   --  0.48 1.02* >1.10*  CREATININE  --  1.66* 1.86*  --  1.60*  --   CKTOTAL  --   --  136  --   --   --     Estimated Creatinine Clearance: 32.7 mL/min (A) (by C-G formula based on SCr of 1.6 mg/dL (H)).   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   Unknown   amLODipine  (NORVASC ) 10 MG tablet Take 1 tablet (10 mg total) by mouth daily. 30 tablet 3 01/22/2025   Ascorbic Acid (VITAMIN C PO) Take 1 tablet by mouth See admin instructions. Take one tablet by mouth with iron (ferrous sulfate) - occasionally    01/22/2025   FERROUS SULFATE PO Take 1 tablet by mouth See admin instructions. Take one tablet by mouth with vitamin c - occasionally   01/22/2025   hydrALAZINE  (APRESOLINE ) 10 MG tablet Take 10 mg by mouth as needed (only take as needed if bp is 170/100.).   Past Week   metFORMIN (GLUCOPHAGE-XR) 500 MG 24 hr tablet Take 500 mg by mouth in the morning and at bedtime.   Past Week   ORGOVYX 120 MG tablet Take 120 mg by mouth daily.   Past Week   pantoprazole  (PROTONIX ) 40 MG tablet Take 1 tablet (40 mg total) by mouth 2 (two) times daily before a meal. (Patient taking differently: Take 40 mg by mouth daily.) 60 tablet 0 01/22/2025   polyethylene glycol (MIRALAX  / GLYCOLAX ) 17 g packet Take 17 g by mouth 2 (two) times daily. 14 each 0 Unknown   potassium chloride (KLOR-CON) 10 MEQ tablet Take 10 mEq by mouth daily.   01/22/2025   pravastatin  (PRAVACHOL ) 20 MG tablet Take 20 mg by mouth every morning.   01/22/2025  telmisartan (MICARDIS) 20 MG tablet Take 20 mg by mouth at bedtime.   Past Week   TRELEGY ELLIPTA 100-62.5-25 MCG/INH AEPB Inhale 1 puff into the lungs daily as needed (shortness of breath/wheezing).   Past Week   XARELTO  20 MG TABS tablet Take 20 mg by mouth every morning.   01/22/2025 Morning   Scheduled:   docusate sodium   100 mg Oral BID   irbesartan   75 mg Oral Daily   pravastatin   20 mg Oral q morning   Infusions:   sodium chloride  75 mL/hr at 01/23/25 1052   cefTRIAXone  (ROCEPHIN )  IV 2 g (01/24/25 0851)   heparin  1,350 Units/hr (01/23/25 2229)   metronidazole  500 mg (01/24/25 0648)   PRN: hydrALAZINE , HYDROcodone -acetaminophen , methocarbamol  **OR** methocarbamol  (ROBAXIN ) injection, morphine  injection  Assessment: 89 yom with a history of HTN, HLD, ocular myasthenia gravis, hx of PE on Xarelto , T2DM, CKD, prostate cancer, BPH, GERD. Patient is presenting with fall. Femur fracture noted in the ED. Heparin  per pharmacy consult placed for Hx of PE and malignancy.  Patient is on  Xarelto  prior to arrival. Last dose 1/30 AM per med rec. Will require aPTT monitoring due to likely falsely high anti-Xa level secondary to DOAC use.  Hgb 10.4; plt 244  Heparin  and PTT came back supratherapeutic x2 this AM. No bleeding issue per Rn. Collected appropriately.   Goal of Therapy:  Heparin  level 0.3-0.7 units/ml aPTT 66-102 seconds Monitor platelets by anticoagulation protocol: Yes   Plan:  Hold heparin  x2 hrs then decrease rate to 1150 units/hr Check aPTT & anti-Xa level in 8 hours and daily while on heparin  Continue to monitor via aPTT until levels are correlated Continue to monitor H&H and platelets  Sergio Batch, PharmD, BCIDP, AAHIVP, CPP Infectious Disease Pharmacist 01/24/2025 12:26 PM           [1]  Allergies Allergen Reactions   Aspirin Other (See Comments)    REACTION: full strength; causes gi irritation   Nsaids     Other Reaction(s): Not available

## 2025-01-24 NOTE — Anesthesia Preprocedure Evaluation (Signed)
 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 on medications (-) angina + CAD  (-) Past MI  Rhythm:Regular Rate:Normal     Neuro/Psych  Neuromuscular disease (hx of occular myastenia gravis)    GI/Hepatic ,GERD  Medicated and Controlled,,  Endo/Other  diabetes, Type 2, Oral Hypoglycemic Agents    Renal/GU Renal InsufficiencyRenal diseaseStage 3b  Lab Results      Component                Value               Date                             K                        5.0                 01/24/2025                BUN                      26 (H)              01/24/2025                CREATININE               1.60 (H)            01/24/2025                GFRNONAA                 41 (L)              01/24/2025                      GLUCOSE                  90                  01/24/2025              Prostate ca    Musculoskeletal  (+) Arthritis ,    Abdominal   Peds  Hematology  (+) Blood dyscrasia, anemia Lab Results      Component                Value               Date                      WBC  5.8                 01/24/2025                HGB                      9.4 (L)             01/24/2025                HCT                      28.3 (L)            01/24/2025                MCV                      93.4                01/24/2025                PLT                      198                 01/24/2025              Anesthesia Other Findings All: ASA,  NSAIDs  Reproductive/Obstetrics                              Anesthesia Physical Anesthesia Plan  ASA: 3  Anesthesia Plan: General   Post-op Pain Management: Precedex  and Ofirmev  IV (intra-op)*   Induction: Intravenous  PONV Risk Score and Plan: 2 and Propofol  infusion, TIVA, Treatment may vary due to age or medical condition and Ondansetron   Airway Management Planned: Oral ETT  Additional Equipment: None  Intra-op Plan:   Post-operative Plan: Extubation in OR  Informed Consent: I have reviewed the patients History and Physical, chart, labs and discussed the procedure including the risks, benefits and alternatives for the proposed anesthesia with the patient or authorized representative who has indicated his/her understanding and acceptance.     Dental advisory given  Plan Discussed with:   Anesthesia Plan Comments: (TIVA GA)         Anesthesia Quick Evaluation  "

## 2025-01-24 NOTE — Progress Notes (Signed)
 PHARMACY - ANTICOAGULATION CONSULT NOTE  Pharmacy Consult for heparin  Indication: h/o VTE  Labs: Recent Labs    01/22/25 2142 01/22/25 2355 01/23/25 0838 01/23/25 2040 01/24/25 0557 01/24/25 1055 01/24/25 2209  HGB 10.4*  --   --   --  9.4*  --   --   HCT 31.4*  --   --   --  28.3*  --   --   PLT 244  --   --   --  198  --   --   APTT  --   --   --    < > 194* 180* 127*  HEPARINUNFRC  --   --   --    < > 1.02* >1.10* 0.89*  CREATININE  --  1.66* 1.86*  --  1.60*  --   --   CKTOTAL  --   --  136  --   --   --   --    < > = values in this interval not displayed.   Assessment: 89yo male remains supratherapeutic on heparin  after rate change but closer to goal; no infusion issues or signs of bleeding per RN.  Goal of Therapy:  aPTT 66-102 seconds   Plan:  Decrease heparin  infusion by 2 units/kg/hr to 1000 units/hr. Check PTT in 8 hours.   Marvetta Dauphin, PharmD, BCPS 01/24/2025 11:02 PM

## 2025-01-24 NOTE — Plan of Care (Signed)
   Problem: Activity: Goal: Risk for activity intolerance will decrease Outcome: Progressing

## 2025-01-25 ENCOUNTER — Inpatient Hospital Stay (HOSPITAL_COMMUNITY)

## 2025-01-25 ENCOUNTER — Encounter (HOSPITAL_COMMUNITY): Payer: Self-pay | Admitting: Internal Medicine

## 2025-01-25 ENCOUNTER — Encounter (HOSPITAL_COMMUNITY)
Admission: EM | Disposition: A | Discharge status: Rehabilitation Facility | Payer: Self-pay | Source: Home / Self Care | Attending: Internal Medicine

## 2025-01-25 ENCOUNTER — Encounter (HOSPITAL_COMMUNITY): Payer: Self-pay

## 2025-01-25 ENCOUNTER — Encounter (HOSPITAL_COMMUNITY): Payer: Self-pay | Admitting: Anesthesiology

## 2025-01-25 DIAGNOSIS — R55 Syncope and collapse: Secondary | ICD-10-CM

## 2025-01-25 DIAGNOSIS — S72012A Unspecified intracapsular fracture of left femur, initial encounter for closed fracture: Secondary | ICD-10-CM

## 2025-01-25 LAB — BASIC METABOLIC PANEL WITH GFR
Anion gap: 11 (ref 5–15)
BUN: 27 mg/dL — ABNORMAL HIGH (ref 8–23)
CO2: 23 mmol/L (ref 22–32)
Calcium: 9 mg/dL (ref 8.9–10.3)
Chloride: 103 mmol/L (ref 98–111)
Creatinine, Ser: 1.67 mg/dL — ABNORMAL HIGH (ref 0.61–1.24)
GFR, Estimated: 39 mL/min — ABNORMAL LOW
Glucose, Bld: 93 mg/dL (ref 70–99)
Potassium: 4.7 mmol/L (ref 3.5–5.1)
Sodium: 138 mmol/L (ref 135–145)

## 2025-01-25 LAB — HEPARIN LEVEL (UNFRACTIONATED)
Heparin Unfractionated: 0.28 [IU]/mL — ABNORMAL LOW (ref 0.30–0.70)
Heparin Unfractionated: 0.35 [IU]/mL (ref 0.30–0.70)

## 2025-01-25 LAB — CBC
HCT: 30.2 % — ABNORMAL LOW (ref 39.0–52.0)
Hemoglobin: 9.9 g/dL — ABNORMAL LOW (ref 13.0–17.0)
MCH: 30.8 pg (ref 26.0–34.0)
MCHC: 32.8 g/dL (ref 30.0–36.0)
MCV: 94.1 fL (ref 80.0–100.0)
Platelets: 202 10*3/uL (ref 150–400)
RBC: 3.21 MIL/uL — ABNORMAL LOW (ref 4.22–5.81)
RDW: 15.9 % — ABNORMAL HIGH (ref 11.5–15.5)
WBC: 4.3 10*3/uL (ref 4.0–10.5)
nRBC: 0 % (ref 0.0–0.2)

## 2025-01-25 LAB — ECHOCARDIOGRAM COMPLETE
AR max vel: 2.29 cm2
AV Area VTI: 2.52 cm2
AV Area mean vel: 2.39 cm2
AV Mean grad: 8 mmHg
AV Peak grad: 13.8 mmHg
Ao pk vel: 1.86 m/s
Area-P 1/2: 2.16 cm2
Calc EF: 47.7 %
Height: 71 in
MV VTI: 2.94 cm2
S' Lateral: 3.6 cm
Single Plane A2C EF: 49.8 %
Single Plane A4C EF: 49.2 %
Weight: 2799.98 [oz_av]

## 2025-01-25 LAB — APTT
aPTT: 44 s — ABNORMAL HIGH (ref 24–36)
aPTT: 58 s — ABNORMAL HIGH (ref 24–36)

## 2025-01-25 LAB — GLUCOSE, CAPILLARY
Glucose-Capillary: 93 mg/dL (ref 70–99)
Glucose-Capillary: 103 mg/dL — ABNORMAL HIGH (ref 70–99)

## 2025-01-25 MED ORDER — ONDANSETRON HCL 4 MG/2ML IJ SOLN: 4.0000 mg | Freq: Once | INTRAMUSCULAR | Status: DC | PRN

## 2025-01-25 MED ORDER — ACETAMINOPHEN 10 MG/ML IV SOLN
1000.0000 mg | Freq: Once | INTRAVENOUS | Status: DC | PRN
  Administered 2025-01-25: 1000 mg via INTRAVENOUS

## 2025-01-25 MED ORDER — PROPOFOL 10 MG/ML IV BOLUS
INTRAVENOUS | Status: DC | PRN
  Administered 2025-01-25: 90 mg via INTRAVENOUS

## 2025-01-25 MED ORDER — DEXAMETHASONE SOD PHOSPHATE PF 10 MG/ML IJ SOLN
INTRAMUSCULAR | Status: AC
  Filled 2025-01-25: qty 1

## 2025-01-25 MED ORDER — 0.9 % SODIUM CHLORIDE (POUR BTL) OPTIME
TOPICAL | Status: DC | PRN
  Administered 2025-01-25: 1000 mL

## 2025-01-25 MED ORDER — ONDANSETRON HCL 4 MG PO TABS: 4.0000 mg | ORAL_TABLET | Freq: Four times a day (QID) | ORAL | Status: DC | PRN

## 2025-01-25 MED ORDER — PHENOL 1.4 % MT LIQD: 1.0000 | OROMUCOSAL | Status: DC | PRN

## 2025-01-25 MED ORDER — INSULIN ASPART 100 UNIT/ML IJ SOLN: 0.0000 [IU] | INTRAMUSCULAR | Status: DC | PRN

## 2025-01-25 MED ORDER — ROCURONIUM BROMIDE 10 MG/ML (PF) SYRINGE
PREFILLED_SYRINGE | INTRAVENOUS | Status: DC | PRN
  Administered 2025-01-25: 30 mg via INTRAVENOUS

## 2025-01-25 MED ORDER — ONDANSETRON HCL 4 MG/2ML IJ SOLN
INTRAMUSCULAR | Status: AC
  Filled 2025-01-25: qty 2

## 2025-01-25 MED ORDER — METOCLOPRAMIDE HCL 5 MG PO TABS: 5.0000 mg | ORAL_TABLET | Freq: Three times a day (TID) | ORAL | Status: DC | PRN

## 2025-01-25 MED ORDER — SUGAMMADEX SODIUM 200 MG/2ML IV SOLN
INTRAVENOUS | Status: DC | PRN
  Administered 2025-01-25: 200 mg via INTRAVENOUS

## 2025-01-25 MED ORDER — PROPOFOL 500 MG/50ML IV EMUL
INTRAVENOUS | Status: DC | PRN
  Administered 2025-01-25: 150 ug/kg/min via INTRAVENOUS

## 2025-01-25 MED ORDER — LIDOCAINE 2% (20 MG/ML) 5 ML SYRINGE
INTRAMUSCULAR | Status: DC | PRN
  Administered 2025-01-25: 80 mg via INTRAVENOUS

## 2025-01-25 MED ORDER — FENTANYL CITRATE (PF) 100 MCG/2ML IJ SOLN: 25.0000 ug | INTRAMUSCULAR | Status: DC | PRN

## 2025-01-25 MED ORDER — ONDANSETRON HCL 4 MG/2ML IJ SOLN
4.0000 mg | Freq: Four times a day (QID) | INTRAMUSCULAR | Status: DC | PRN
  Administered 2025-01-26: 4 mg via INTRAVENOUS
  Filled 2025-01-25: qty 2

## 2025-01-25 MED ORDER — DEXMEDETOMIDINE HCL IN NACL 80 MCG/20ML IV SOLN
INTRAVENOUS | Status: DC | PRN
  Administered 2025-01-25: 8 ug via INTRAVENOUS

## 2025-01-25 MED ORDER — MENTHOL 3 MG MT LOZG: 1.0000 | LOZENGE | OROMUCOSAL | Status: DC | PRN

## 2025-01-25 MED ORDER — ONDANSETRON HCL 4 MG/2ML IJ SOLN
INTRAMUSCULAR | Status: DC | PRN
  Administered 2025-01-25: 4 mg via INTRAVENOUS

## 2025-01-25 MED ORDER — TRANEXAMIC ACID-NACL 1000-0.7 MG/100ML-% IV SOLN
1000.0000 mg | Freq: Once | INTRAVENOUS | Status: AC
  Administered 2025-01-25: 1000 mg via INTRAVENOUS
  Filled 2025-01-25: qty 100

## 2025-01-25 MED ORDER — HEPARIN (PORCINE) 25000 UT/250ML-% IV SOLN
1100.0000 [IU]/h | INTRAVENOUS | Status: DC
  Administered 2025-01-25: 1000 [IU]/h via INTRAVENOUS
  Administered 2025-01-26: 1100 [IU]/h via INTRAVENOUS
  Filled 2025-01-25: qty 250

## 2025-01-25 MED ORDER — LACTATED RINGERS IV SOLN: INTRAVENOUS | Status: DC

## 2025-01-25 MED ORDER — PHENYLEPHRINE 80 MCG/ML (10ML) SYRINGE FOR IV PUSH (FOR BLOOD PRESSURE SUPPORT)
PREFILLED_SYRINGE | INTRAVENOUS | Status: DC | PRN
  Administered 2025-01-25: 160 ug via INTRAVENOUS

## 2025-01-25 MED ORDER — CHLORHEXIDINE GLUCONATE 0.12 % MT SOLN
15.0000 mL | Freq: Once | OROMUCOSAL | Status: AC
  Administered 2025-01-25: 15 mL via OROMUCOSAL
  Filled 2025-01-25: qty 15

## 2025-01-25 MED ORDER — ACETAMINOPHEN 10 MG/ML IV SOLN
INTRAVENOUS | Status: AC
  Filled 2025-01-25: qty 100

## 2025-01-25 MED ORDER — PROPOFOL 10 MG/ML IV BOLUS
INTRAVENOUS | Status: AC
  Filled 2025-01-25: qty 20

## 2025-01-25 MED ORDER — METOCLOPRAMIDE HCL 5 MG/ML IJ SOLN: 5.0000 mg | Freq: Three times a day (TID) | INTRAMUSCULAR | Status: DC | PRN

## 2025-01-25 MED ORDER — FENTANYL CITRATE (PF) 100 MCG/2ML IJ SOLN
INTRAMUSCULAR | Status: AC
  Filled 2025-01-25: qty 2

## 2025-01-25 MED ORDER — PHENYLEPHRINE HCL-NACL 20-0.9 MG/250ML-% IV SOLN
INTRAVENOUS | Status: DC | PRN
  Administered 2025-01-25: 20 ug/min via INTRAVENOUS

## 2025-01-25 MED ORDER — FENTANYL CITRATE (PF) 250 MCG/5ML IJ SOLN
INTRAMUSCULAR | Status: DC | PRN
  Administered 2025-01-25: 50 ug via INTRAVENOUS
  Administered 2025-01-25 (×2): 25 ug via INTRAVENOUS

## 2025-01-25 MED ORDER — ORAL CARE MOUTH RINSE: 15.0000 mL | Freq: Once | OROMUCOSAL | Status: AC

## 2025-01-25 NOTE — TOC CAGE-AID Note (Signed)
 Transition of Care [facility]) - CAGE-AID Screening   Patient Details  Name: [name]. MRN: [id number] Date of Birth: 06-28-1932  Transition of Care [facility]) CM/SW Contact:    Kelissa Merlin E Damari Suastegui, LCSW Phone Number: 01/25/2025, 2:31 PM   Clinical Narrative:    CAGE-AID Screening:    Have You Ever Felt You Ought to Cut Down on Your Drinking or Drug Use?: No Have People Annoyed You By [employer] Your Drinking Or Drug Use?: No Have You Felt Bad Or Guilty About Your Drinking Or Drug Use?: No Have You Ever Had a Drink or Used Drugs First Thing In The Morning to Steady Your Nerves or to Get Rid of a Hangover?: No CAGE-AID Score: 0  Substance Abuse Education Offered: No

## 2025-01-25 NOTE — Progress Notes (Addendum)
 ANTICOAGULATION CONSULT NOTE  Pharmacy Consult for Heparin  Indication: Hx of PE and malignancy  Allergies[1]  Patient Measurements: Height: 5' 11 (180.3 cm) Weight: 79.4 kg (175 lb) IBW/kg (Calculated) : 75.3 Heparin  Dosing Weight: 79.4 kg  Vital Signs: Temp: 98.2 F (36.8 C) (02/02 1100) Temp Source: Oral (02/02 0648) BP: 181/84 (02/02 1100) Pulse Rate: 66 (02/02 1100)  Labs: Recent Labs    01/22/25 2142 01/22/25 2355 01/23/25 0838 01/23/25 2040 01/24/25 0557 01/24/25 1055 01/24/25 2209 01/25/25 0521 01/25/25 0630  HGB 10.4*  --   --   --  9.4*  --   --  9.9*  --   HCT 31.4*  --   --   --  28.3*  --   --  30.2*  --   PLT 244  --   --   --  198  --   --  202  --   APTT  --   --   --    < > 194* 180* 127*  --  44*  HEPARINUNFRC  --   --   --    < > 1.02* >1.10* 0.89*  --  0.28*  CREATININE  --    < > 1.86*  --  1.60*  --   --  1.67*  --   CKTOTAL  --   --  136  --   --   --   --   --   --    < > = values in this interval not displayed.    Estimated Creatinine Clearance: 31.3 mL/min (A) (by C-G formula based on SCr of 1.67 mg/dL (H)).   Assessment: 89 yom with a history of HTN, HLD, ocular myasthenia gravis, hx of PE on Xarelto , T2DM, CKD, prostate cancer, BPH, GERD. Patient is presenting with fall. Femur fracture noted in the ED. Heparin  per pharmacy consult placed for Hx of PE and malignancy.  Patient is on Xarelto  prior to arrival. Last dose 1/30 AM per med rec. Will require aPTT monitoring due to likely falsely high anti-Xa level secondary to DOAC use.  Heparin  gtt stopped pre-operatively on 2/2 @ 0258 - was running at 1000 units/hr CBC this AM stable Hgb 9.9, Plt 202   Confirmed w provider - ok to restart heparin  gtt today at 1300 - will restart PTA DOAC tomorrow AM if AM HGB is >8.5  Goal of Therapy:  Heparin  level 0.3-0.7 units/ml aPTT 66-102 seconds Monitor platelets by anticoagulation protocol: Yes   Plan:  Restart heparin  at 1000 units/hr at 13:00  today , ok per ortho.  Plan to restart xarelto  as PTA per consult parameters tomorrow AM  Check aPTT, HL in 8 hours  Daily HL, aPTT, CBC  F/u s/sx bleeding   Sharyne Glatter, PharmD, BCCCP Critical Care Clinical Pharmacist 01/25/2025 11:15 AM     [1]  Allergies Allergen Reactions   Aspirin Other (See Comments)    REACTION: full strength; causes gi irritation   Nsaids     Other Reaction(s): Not available

## 2025-01-25 NOTE — Progress Notes (Signed)
 ANTICOAGULATION CONSULT NOTE  Pharmacy Consult for Heparin  Indication: Hx of PE and malignancy  Allergies[1]  Patient Measurements: Height: 5' 11 (180.3 cm) Weight: 79.4 kg (175 lb) IBW/kg (Calculated) : 75.3 Heparin  Dosing Weight: 79.4 kg  Vital Signs: Temp: 98.8 F (37.1 C) (02/02 2100) Temp Source: Oral (02/02 2100) BP: 157/84 (02/02 2140) Pulse Rate: 74 (02/02 2100)  Labs: Recent Labs    01/23/25 0838 01/23/25 2040 01/24/25 0557 01/24/25 1055 01/24/25 2209 01/25/25 0521 01/25/25 0630 01/25/25 2149  HGB  --   --  9.4*  --   --  9.9*  --   --   HCT  --   --  28.3*  --   --  30.2*  --   --   PLT  --   --  198  --   --  202  --   --   APTT  --    < > 194*   < > 127*  --  44* 58*  HEPARINUNFRC  --    < > 1.02*   < > 0.89*  --  0.28* 0.35  CREATININE 1.86*  --  1.60*  --   --  1.67*  --   --   CKTOTAL 136  --   --   --   --   --   --   --    < > = values in this interval not displayed.    Estimated Creatinine Clearance: 31.3 mL/min (A) (by C-G formula based on SCr of 1.67 mg/dL (H)).   Assessment: 89 yom with a history of HTN, HLD, ocular myasthenia gravis, hx of PE on Xarelto , T2DM, CKD, prostate cancer, BPH, GERD. Patient is presenting with fall. Femur fracture noted in the ED. Heparin  per pharmacy consult placed for Hx of PE and malignancy.  Patient is on Xarelto  prior to arrival. Last dose 1/30 AM per med rec. Will require aPTT monitoring due to likely falsely high anti-Xa level secondary to DOAC use.Confirmed w provider - ok to restart heparin  gtt today at 1300 - will restart PTA DOAC tomorrow AM if AM HGB is >8.5  -heparin  level therapeutic, aPTT slightly below goal (not correlating) on 1000 units/hr s/p restart after surgery. Per RN, no signs/symptoms of bleeding or issues with the gtt running continuously.  Goal of Therapy:  Heparin  level 0.3-0.7 units/ml aPTT 66-102 seconds Monitor platelets by anticoagulation protocol: Yes   Plan:  Increase heparin  at  1100 units/hr Plan to restart xarelto  as PTA per consult parameters tomorrow AM (Hgb >/= 8.5) Check aPTT, heparin  level in 8 hours  Daily HL, aPTT, CBC  F/u s/sx bleeding   Rutha Poplar, PharmD, BCPS Clinical Pharmacist 01/25/2025 10:50 PM        [1]  Allergies Allergen Reactions   Aspirin Other (See Comments)    REACTION: full strength; causes gi irritation   Nsaids     Other Reaction(s): Not available

## 2025-01-25 NOTE — Anesthesia Procedure Notes (Signed)
 Procedure Name: Intubation Date/Time: 01/25/2025 8:34 AM  Performed by: Jefm Garnette LABOR, MDPre-anesthesia Checklist: Patient identified, Emergency Drugs available, Suction available and Patient being monitored Patient Re-evaluated:Patient Re-evaluated prior to induction Oxygen Delivery Method: Circle system utilized Preoxygenation: Pre-oxygenation with 100% oxygen Induction Type: IV induction Ventilation: Mask ventilation without difficulty Laryngoscope Size: Mac and 3 Grade View: Grade II Tube type: Oral Tube size: 7.0 mm Number of attempts: 1 Airway Equipment and Method: Stylet and Oral airway Placement Confirmation: ETT inserted through vocal cords under direct vision, positive ETCO2 and breath sounds checked- equal and bilateral Secured at: 21 cm Tube secured with: Tape Dental Injury: Teeth and Oropharynx as per pre-operative assessment

## 2025-01-25 NOTE — Plan of Care (Signed)
   Problem: Coping: Goal: Level of anxiety will decrease Outcome: Progressing   Problem: Safety: Goal: Ability to remain free from injury will improve Outcome: Progressing   Problem: Skin Integrity: Goal: Risk for impaired skin integrity will decrease Outcome: Progressing

## 2025-01-25 NOTE — Anesthesia Postprocedure Evaluation (Signed)
"   Anesthesia Post Note  Patient: [name].  Procedure(s) Performed: PERCUTANEOUS FIXATION OF LEFT FEMORAL NECK FRACTURE (Left: Hip)     Patient location during evaluation: PACU Anesthesia Type: General Level of consciousness: awake and alert Pain management: pain level controlled Vital Signs Assessment: post-procedure vital signs reviewed and stable Respiratory status: spontaneous breathing, nonlabored ventilation, respiratory function stable and patient connected to nasal cannula oxygen Cardiovascular status: blood pressure returned to baseline and stable Postop Assessment: no apparent nausea or vomiting Anesthetic complications: no   There were no known notable events for this encounter.  Last Vitals:  Vitals:   01/25/25 1100 01/25/25 1121  BP: (!) 181/84 (!) 154/80  Pulse: 66 98  Resp: 17 17  Temp: 36.8 C 36.4 C  SpO2: 97% 100%    Last Pain:  Vitals:   01/25/25 1121  TempSrc: Oral  PainSc:    Pain Goal: Patients Stated Pain Goal: 3 (01/25/25 0725)  LLE Motor Response: Purposeful movement (01/25/25 1045) LLE Sensation: Full sensation (01/25/25 1045)            Kurt Azimi      "

## 2025-01-26 DIAGNOSIS — S72012A Unspecified intracapsular fracture of left femur, initial encounter for closed fracture: Secondary | ICD-10-CM | POA: Diagnosis not present

## 2025-01-26 LAB — BASIC METABOLIC PANEL WITH GFR
Anion gap: 7 (ref 5–15)
BUN: 25 mg/dL — ABNORMAL HIGH (ref 8–23)
CO2: 27 mmol/L (ref 22–32)
Calcium: 9.2 mg/dL (ref 8.9–10.3)
Chloride: 105 mmol/L (ref 98–111)
Creatinine, Ser: 1.58 mg/dL — ABNORMAL HIGH (ref 0.61–1.24)
GFR, Estimated: 41 mL/min — ABNORMAL LOW
Glucose, Bld: 112 mg/dL — ABNORMAL HIGH (ref 70–99)
Potassium: 5 mmol/L (ref 3.5–5.1)
Sodium: 139 mmol/L (ref 135–145)

## 2025-01-26 LAB — VITAMIN D 25 HYDROXY (VIT D DEFICIENCY, FRACTURES): Vit D, 25-Hydroxy: 7.9 ng/mL — ABNORMAL LOW (ref 30–100)

## 2025-01-26 LAB — CBC
HCT: 30.2 % — ABNORMAL LOW (ref 39.0–52.0)
Hemoglobin: 10.2 g/dL — ABNORMAL LOW (ref 13.0–17.0)
MCH: 31.4 pg (ref 26.0–34.0)
MCHC: 33.8 g/dL (ref 30.0–36.0)
MCV: 92.9 fL (ref 80.0–100.0)
Platelets: 211 10*3/uL (ref 150–400)
RBC: 3.25 MIL/uL — ABNORMAL LOW (ref 4.22–5.81)
RDW: 15.5 % (ref 11.5–15.5)
WBC: 4.9 10*3/uL (ref 4.0–10.5)
nRBC: 0 % (ref 0.0–0.2)

## 2025-01-26 LAB — HEPARIN LEVEL (UNFRACTIONATED): Heparin Unfractionated: 0.58 [IU]/mL (ref 0.30–0.70)

## 2025-01-26 LAB — APTT: aPTT: 89 s — ABNORMAL HIGH (ref 24–36)

## 2025-01-26 MED ORDER — PANTOPRAZOLE SODIUM 40 MG PO TBEC
40.0000 mg | DELAYED_RELEASE_TABLET | Freq: Every day | ORAL | Status: DC
  Administered 2025-01-26 – 2025-01-28 (×3): 40 mg via ORAL
  Filled 2025-01-26 (×3): qty 1

## 2025-01-26 MED ORDER — POLYETHYLENE GLYCOL 3350 17 G PO PACK
17.0000 g | PACK | Freq: Two times a day (BID) | ORAL | Status: DC
  Administered 2025-01-26 – 2025-01-28 (×3): 17 g via ORAL
  Filled 2025-01-26 (×4): qty 1

## 2025-01-26 MED ORDER — VITAMIN D (ERGOCALCIFEROL) 1.25 MG (50000 UNIT) PO CAPS
50000.0000 [IU] | ORAL_CAPSULE | ORAL | Status: DC
  Administered 2025-01-27: 50000 [IU] via ORAL
  Filled 2025-01-26: qty 1

## 2025-01-26 MED ORDER — AMLODIPINE BESYLATE 10 MG PO TABS
10.0000 mg | ORAL_TABLET | Freq: Every day | ORAL | Status: DC
  Administered 2025-01-26 – 2025-01-28 (×3): 10 mg via ORAL
  Filled 2025-01-26 (×3): qty 1

## 2025-01-26 MED ORDER — RIVAROXABAN 20 MG PO TABS
20.0000 mg | ORAL_TABLET | Freq: Every day | ORAL | Status: DC
  Administered 2025-01-26 – 2025-01-28 (×3): 20 mg via ORAL
  Filled 2025-01-26 (×3): qty 1

## 2025-01-26 NOTE — Progress Notes (Signed)
 Tele called and notified me that patient had a 10beat run of SVT. RN notified MD patient and family in the room Patient just states that he is nauseous but other than that he is okay Zofran  given.

## 2025-01-26 NOTE — Evaluation (Signed)
 Occupational Therapy Evaluation Patient Details Name: [name]. MRN: [id number] DOB: 08-22-1932 Today's Date: 01/26/2025   History of Present Illness   Pt is a 89 y.o. M who presents 01/22/2025 with a fall after standing up and presyncopal episode. Pt found to have closed subcapital fracture of left femur now s/p cannulated screw fixation of left femoral neck fracture 01/25/2025. PMH includes PE, myasthenia gravis (ocular), DM type II, CKD stage II, aortic artherosclerosis, HTN, HLD, osteoarthritis, iliac artery aneurysm, prostate CA.     Clinical Impressions Patient admitted for the diagnosis above.  PTA he lives at home with his spouse, who can provide supportive assist.  Patient continues to be very independent at home, and is motivated to regain his function.  Currently he is needing up to Mod A for lower body ADL from a sit to stand level given pain and TDWB status.  Min A for basic in room mobility/toileting with Max cues for RW management and sequencing to maintain TDWB.  OT will continue efforts in the acute setting to address deficits, and Patient will benefit from intensive inpatient follow-up therapy, >3 hours/day.  Patient needs to be, and has the capacity to reach a Mod I level with post acute rehab prior to returning home.       If plan is discharge home, recommend the following:   A lot of help with bathing/dressing/bathroom;A little help with walking and/or transfers;Assist for transportation     Functional Status Assessment   Patient has had a recent decline in their functional status and demonstrates the ability to make significant improvements in function in a reasonable and predictable amount of time.     Equipment Recommendations   Tub/shower seat     Recommendations for Other Services         Precautions/Restrictions   Precautions Precautions: Fall Restrictions Weight Bearing Restrictions Per Provider Order: Yes LLE Weight Bearing Per Provider  Order: Touchdown weight bearing     Mobility Bed Mobility Overal bed mobility: Needs Assistance Bed Mobility: Supine to Sit     Supine to sit: Min assist          Transfers Overall transfer level: Needs assistance Equipment used: Rolling walker (2 wheels) Transfers: Sit to/from Stand Sit to Stand: Min assist, +2 safety/equipment                  Balance Overall balance assessment: Needs assistance Sitting-balance support: Feet supported Sitting balance-Leahy Scale: Fair     Standing balance support: Bilateral upper extremity supported Standing balance-Leahy Scale: Poor                             ADL either performed or assessed with clinical judgement   ADL       Grooming: Wash/dry hands;Wash/dry face;Set up;Sitting               Lower Body Dressing: Moderate assistance;Sit to/from stand   Toilet Transfer: Minimal assistance;Rolling walker (2 wheels);Regular Toilet;Ambulation                   Vision Patient Visual Report: No change from baseline       Perception Perception: Not tested       Praxis Praxis: Not tested       Pertinent Vitals/Pain Pain Assessment Pain Assessment: Faces Faces Pain Scale: Hurts little more Pain Location: L hip Pain Descriptors / Indicators: Operative site guarding Pain Intervention(s): Monitored during session  Extremity/Trunk Assessment Upper Extremity Assessment Upper Extremity Assessment: Overall WFL for tasks assessed   Lower Extremity Assessment Lower Extremity Assessment: Defer to PT evaluation LLE Deficits / Details: L fem neck fx s/p screw fixation. Able to perform heel slide, ankle DF WFL   Cervical / Trunk Assessment Cervical / Trunk Assessment: Other exceptions (forward head posture)   Communication Communication Communication: No apparent difficulties Factors Affecting Communication: Hearing impaired   Cognition Arousal: Alert Behavior During Therapy: WFL for  tasks assessed/performed Cognition: No apparent impairments                               Following commands: Intact       Cueing  General Comments   Cueing Techniques: Verbal cues;Tactile cues;Visual cues      Exercises     Shoulder Instructions      Home Living Family/patient expects to be discharged to:: Private residence Living Arrangements: Spouse/significant other Available Help at Discharge: Family Type of Home: House Home Access: Stairs to enter Secretary/administrator of Steps: 3 Entrance Stairs-Rails: Right Home Layout: One level     Bathroom Shower/Tub: Estate Manager/land Agent Accessibility: Yes   Home Equipment: Grab bars - tub/shower;Shower seat - built in;BSC/3in1          Prior Functioning/Environment Prior Level of Function : Independent/Modified Independent             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, semi retired ADLs Comments: Ind with ADL,iADL, drives and continues to work part time    OT Problem List: Decreased strength;Decreased range of motion;Decreased activity tolerance;Impaired balance (sitting and/or standing);Pain   OT Treatment/Interventions: Self-care/ADL training;Balance training;Therapeutic activities;DME and/or AE instruction;Patient/family education      OT Goals(Current goals can be found in the care plan section)   Acute Rehab OT Goals Patient Stated Goal: Return home OT Goal Formulation: With patient Time For Goal Achievement: 02/09/25 Potential to Achieve Goals: Good ADL Goals Pt Will Perform Grooming: with supervision;standing Pt Will Perform Lower Body Dressing: with min assist;sit to/from stand Pt Will Transfer to Toilet: with modified independence;ambulating;regular height toilet   OT Frequency:  Min 2X/week    Co-evaluation PT/OT/SLP Co-Evaluation/Treatment: Yes Reason for Co-Treatment: For patient/therapist safety;To address functional/ADL transfers PT goals  addressed during session: Mobility/safety with mobility OT goals addressed during session: ADL's and self-care      AM-PAC OT 6 Clicks Daily Activity     Outcome Measure Help from another person eating meals?: None Help from another person taking care of personal grooming?: A Little Help from another person toileting, which includes using toliet, bedpan, or urinal?: A Little Help from another person bathing (including washing, rinsing, drying)?: A Lot Help from another person to put on and taking off regular upper body clothing?: None Help from another person to put on and taking off regular lower body clothing?: A Lot 6 Click Score: 18   End of Session Equipment Utilized During Treatment: Rolling walker (2 wheels) Nurse Communication: Mobility status  Activity Tolerance: Patient tolerated treatment well Patient left: in chair;with call bell/phone within reach;with chair alarm set  OT Visit Diagnosis: Unsteadiness on feet (R26.81);Muscle weakness (generalized) (M62.81);Pain Pain - Right/Left: Left Pain - part of body: Hip;Leg                Time: 9093-9070 OT Time Calculation (min): 23 min Charges:  OT General Charges $OT Visit: 1 Visit OT Evaluation $  OT Eval Moderate Complexity: 1 Mod  01/26/2025  RP, OTR/L  Acute Rehabilitation Services  Office:  [phone]   Charlie JONETTA Halsted 01/26/2025, 1:16 PM

## 2025-01-26 NOTE — Plan of Care (Signed)
   Problem: Elimination: Goal: Will not experience complications related to bowel motility Outcome: Progressing   Problem: Pain Managment: Goal: General experience of comfort will improve and/or be controlled Outcome: Progressing   Problem: Safety: Goal: Ability to remain free from injury will improve Outcome: Progressing

## 2025-01-26 NOTE — Evaluation (Signed)
 Physical Therapy Evaluation Patient Details Name: [name]. MRN: [id number] DOB: 1932/10/23 Today's Date: 01/26/2025  History of Present Illness  Pt is a 89 y.o. M who presents 01/22/2025 with a fall after standing up and presyncopal episode. Pt found to have closed subcapital fracture of left femur now s/p cannulated screw fixation of left femoral neck fracture 01/25/2025. PMH includes PE, myasthenia gravis (ocular), DM type II, CKD stage II, aortic artherosclerosis, HTN, HLD, osteoarthritis, iliac artery aneurysm, prostate CA.  Clinical Impression  Pt presents s/p procedure listed above. Pt is pleasant and motivated to participate in physical therapy evaluation. PTA, pt lives with his spouse in a home with 3 steps to enter and is the owner of an auto interior shop. He presents with decreased functional mobility secondary to LLE weakness, pain, decreased knowledge of weightbearing precautions, and impaired standing balance. Pt requiring min assist (+2 safety) for transfers and bed room ambulation using RW with multimodal cueing for appropriate DME use and maintenance of precautions. Orthostatics negative upon assessment (see vitals flowsheet), however, pt with nystagmus with smooth pursuits to the left. Saccades were normal and cover test was negative. He denies dizziness today, but would benefit from further vestibular assessment next session; he reports history of vertigo. Patient will benefit from intensive inpatient follow-up therapy, >3 hours/day to address deficits, maximize functional mobility and decrease caregiver burden.      If plan is discharge home, recommend the following: A little help with walking and/or transfers;A little help with bathing/dressing/bathroom;Assistance with cooking/housework;Assist for transportation;Help with stairs or ramp for entrance   Can travel by private vehicle        Equipment Recommendations Wheelchair (measurements PT)  Recommendations for Other  Services       Functional Status Assessment Patient has had a recent decline in their functional status and demonstrates the ability to make significant improvements in function in a reasonable and predictable amount of time.     Precautions / Restrictions Precautions Precautions: Fall Restrictions Weight Bearing Restrictions Per Provider Order: Yes LLE Weight Bearing Per Provider Order: Touchdown weight bearing      Mobility  Bed Mobility Overal bed mobility: Needs Assistance Bed Mobility: Supine to Sit     Supine to sit: Min assist     General bed mobility comments: Step by step cueing, instruction for utilization of bed rail, increased time to scoot forward to edge of bed    Transfers Overall transfer level: Needs assistance Equipment used: Rolling walker (2 wheels) Transfers: Sit to/from Stand Sit to Stand: Min assist, +2 safety/equipment           General transfer comment: Facilitation to bring LLE forwards to prevent weightbearing with transition to stand or sit.    Ambulation/Gait Ambulation/Gait assistance: Min assist, +2 safety/equipment Gait Distance (Feet): 15 Feet Assistive device: Rolling walker (2 wheels) Gait Pattern/deviations: Step-to pattern Gait velocity: decreased Gait velocity interpretation: <1.31 ft/sec, indicative of household ambulator   General Gait Details: Max multimodal cueing for walker use, sequencing/technique, weightbearing precautions  Stairs            Wheelchair Mobility     Tilt Bed    Modified Rankin (Stroke Patients Only)       Balance Overall balance assessment: Needs assistance Sitting-balance support: Feet supported Sitting balance-Leahy Scale: Fair     Standing balance support: Bilateral upper extremity supported Standing balance-Leahy Scale: Poor  Pertinent Vitals/Pain Pain Assessment Pain Assessment: Faces Faces Pain Scale: Hurts little more Pain  Location: L hip Pain Descriptors / Indicators: Operative site guarding Pain Intervention(s): Monitored during session, Premedicated before session    Home Living Family/patient expects to be discharged to:: Private residence Living Arrangements: Spouse/significant other Available Help at Discharge: Family Type of Home: House Home Access: Stairs to enter Entrance Stairs-Rails: Right Entrance Stairs-Number of Steps: 3   Home Layout: One level Home Equipment: Grab bars - tub/shower;Shower seat - built in;BSC/3in1      Prior Function Prior Level of Function : Independent/Modified Independent             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, semi retired       Radio Producer Extremity Assessment Upper Extremity Assessment: Overall WFL for tasks assessed    Lower Extremity Assessment Lower Extremity Assessment: LLE deficits/detail LLE Deficits / Details: L fem neck fx s/p screw fixation. Able to perform heel slide, ankle DF WFL    Cervical / Trunk Assessment Cervical / Trunk Assessment: Other exceptions (forward head posture)  Communication   Communication Communication: Impaired Factors Affecting Communication: Hearing impaired    Cognition Arousal: Alert Behavior During Therapy: WFL for tasks assessed/performed   PT - Cognitive impairments: No apparent impairments                         Following commands: Intact       Cueing Cueing Techniques: Verbal cues, Tactile cues, Visual cues     General Comments      Exercises     Assessment/Plan    PT Assessment Patient needs continued PT services  PT Problem List Decreased strength;Decreased activity tolerance;Decreased balance;Decreased mobility;Pain       PT Treatment Interventions DME instruction;Gait training;Stair training;Functional mobility training;Therapeutic activities;Therapeutic exercise;Balance training;Patient/family education    PT Goals (Current  goals can be found in the Care Plan section)  Acute Rehab PT Goals Patient Stated Goal: go home PT Goal Formulation: With patient/family Time For Goal Achievement: 02/09/25 Potential to Achieve Goals: Good    Frequency Min 3X/week     Co-evaluation PT/OT/SLP Co-Evaluation/Treatment: Yes Reason for Co-Treatment: For patient/therapist safety;To address functional/ADL transfers PT goals addressed during session: Mobility/safety with mobility         AM-PAC PT 6 Clicks Mobility  Outcome Measure Help needed turning from your back to your side while in a flat bed without using bedrails?: A Little Help needed moving from lying on your back to sitting on the side of a flat bed without using bedrails?: A Little Help needed moving to and from a bed to a chair (including a wheelchair)?: A Little Help needed standing up from a chair using your arms (e.g., wheelchair or bedside chair)?: A Little Help needed to walk in hospital room?: A Little Help needed climbing 3-5 steps with a railing? : A Lot 6 Click Score: 17    End of Session Equipment Utilized During Treatment: Gait belt Activity Tolerance: Patient tolerated treatment well Patient left: in chair;with call bell/phone within reach;with chair alarm set Nurse Communication: Mobility status PT Visit Diagnosis: Unsteadiness on feet (R26.81);History of falling (Z91.81);Difficulty in walking, not elsewhere classified (R26.2);Pain Pain - Right/Left: Left Pain - part of body: Hip    Time: 9144-9070 PT Time Calculation (min) (ACUTE ONLY): 34 min   Charges:   PT Evaluation $PT Eval Low Complexity: 1 Low   PT General Charges $$ ACUTE  PT VISIT: 1 Visit         Aleck Daring, PT, DPT Acute Rehabilitation Services Office [phone]   Aleck ONEIDA Daring 01/26/2025, 11:15 AM

## 2025-01-26 NOTE — Progress Notes (Signed)
  Inpatient Rehab Admissions Coordinator :  Per therapy recommendations, patient was screened for CIR candidacy by Ottie Glazier RN MSN.  At this time patient appears to be a potential candidate for CIR. I will place a rehab consult per protocol for full assessment. Please call me with any questions.  Ottie Glazier RN MSN Admissions Coordinator [phone]

## 2025-01-27 DIAGNOSIS — I2510 Atherosclerotic heart disease of native coronary artery without angina pectoris: Secondary | ICD-10-CM

## 2025-01-27 DIAGNOSIS — E7800 Pure hypercholesterolemia, unspecified: Secondary | ICD-10-CM

## 2025-01-27 LAB — CBC WITH DIFFERENTIAL/PLATELET
Abs Immature Granulocytes: 0.02 10*3/uL (ref 0.00–0.07)
Basophils Absolute: 0 10*3/uL (ref 0.0–0.1)
Basophils Relative: 1 %
Eosinophils Absolute: 0.4 10*3/uL (ref 0.0–0.5)
Eosinophils Relative: 10 %
HCT: 32.3 % — ABNORMAL LOW (ref 39.0–52.0)
Hemoglobin: 10.5 g/dL — ABNORMAL LOW (ref 13.0–17.0)
Immature Granulocytes: 1 %
Lymphocytes Relative: 16 %
Lymphs Abs: 0.6 10*3/uL — ABNORMAL LOW (ref 0.7–4.0)
MCH: 30.8 pg (ref 26.0–34.0)
MCHC: 32.5 g/dL (ref 30.0–36.0)
MCV: 94.7 fL (ref 80.0–100.0)
Monocytes Absolute: 0.5 10*3/uL (ref 0.1–1.0)
Monocytes Relative: 14 %
Neutro Abs: 2 10*3/uL (ref 1.7–7.7)
Neutrophils Relative %: 58 %
Platelets: 224 10*3/uL (ref 150–400)
RBC: 3.41 MIL/uL — ABNORMAL LOW (ref 4.22–5.81)
RDW: 15.6 % — ABNORMAL HIGH (ref 11.5–15.5)
WBC: 3.4 10*3/uL — ABNORMAL LOW (ref 4.0–10.5)
nRBC: 0 % (ref 0.0–0.2)

## 2025-01-27 LAB — BASIC METABOLIC PANEL WITH GFR
Anion gap: 8 (ref 5–15)
BUN: 29 mg/dL — ABNORMAL HIGH (ref 8–23)
CO2: 25 mmol/L (ref 22–32)
Calcium: 9.4 mg/dL (ref 8.9–10.3)
Chloride: 104 mmol/L (ref 98–111)
Creatinine, Ser: 1.49 mg/dL — ABNORMAL HIGH (ref 0.61–1.24)
GFR, Estimated: 44 mL/min — ABNORMAL LOW
Glucose, Bld: 96 mg/dL (ref 70–99)
Potassium: 4.7 mmol/L (ref 3.5–5.1)
Sodium: 138 mmol/L (ref 135–145)

## 2025-01-27 NOTE — Progress Notes (Cosign Needed)
 "                                                                         Orthopaedic Trauma Service Progress Note  Patient ID: [name]. MRN: [id number] DOB/AGE: 02/04/1932 89 y.o.  Subjective:  Doing well  No complaints Ortho issues stable   ROS As above  Today's  total administered Morphine  Milligram Equivalents: 0 Yesterday's total administered Morphine  Milligram Equivalents: 10  Objective:   VITALS:   Vitals:   01/26/25 1556 01/26/25 1942 01/27/25 0500 01/27/25 0727  BP: (!) 152/92 (!) 149/77 130/73 129/82  Pulse: 66 68 63 (!) 57  Resp: 18 16 16 18   Temp: 98.1 F (36.7 C) 98.2 F (36.8 C) 97.7 F (36.5 C) 98.4 F (36.9 C)  TempSrc: Oral     SpO2: 100% 97% 94% 98%  Weight:      Height:        Estimated body mass index is 24.41 kg/m as calculated from the following:   Height as of this encounter: 5' 11 (1.803 m).   Weight as of this encounter: 79.4 kg.   Intake/Output      02/03 0701 02/04 0700 02/04 0701 02/05 0700   P.O.     I.V. (mL/kg)     IV Piggyback     Total Intake(mL/kg)     Urine (mL/kg/hr) 500 (0.3)    Blood     Total Output 500    Net -500           LABS  Results for orders placed or performed during the hospital encounter of 01/22/25 (from the past 24 hours)  CBC with Differential/Platelet     Status: Abnormal   Collection Time: 01/27/25  5:40 AM  Result Value Ref Range   WBC 3.4 (L) 4.0 - 10.5 K/uL   RBC 3.41 (L) 4.22 - 5.81 MIL/uL   Hemoglobin 10.5 (L) 13.0 - 17.0 g/dL   HCT 67.6 (L) 60.9 - 47.9 %   MCV 94.7 80.0 - 100.0 fL   MCH 30.8 26.0 - 34.0 pg   MCHC 32.5 30.0 - 36.0 g/dL   RDW 84.3 (H) 88.4 - 84.4 %   Platelets 224 150 - 400 K/uL   nRBC 0.0 0.0 - 0.2 %   Neutrophils Relative % 58 %   Neutro Abs 2.0 1.7 - 7.7 K/uL   Lymphocytes Relative 16 %   Lymphs Abs 0.6 (L) 0.7 - 4.0 K/uL   Monocytes Relative 14 %   Monocytes Absolute 0.5 0.1 - 1.0 K/uL   Eosinophils Relative 10 %   Eosinophils Absolute 0.4 0.0 - 0.5  K/uL   Basophils Relative 1 %   Basophils Absolute 0.0 0.0 - 0.1 K/uL   Immature Granulocytes 1 %   Abs Immature Granulocytes 0.02 0.00 - 0.07 K/uL  Basic metabolic panel with GFR     Status: Abnormal   Collection Time: 01/27/25  5:40 AM  Result Value Ref Range   Sodium 138 135 - 145 mmol/L   Potassium 4.7 3.5 - 5.1 mmol/L   Chloride 104 98 - 111 mmol/L   CO2 25 22 - 32 mmol/L   Glucose, Bld 96 70 - 99 mg/dL   BUN 29 (  H) 8 - 23 mg/dL   Creatinine, Ser 8.50 (H) 0.61 - 1.24 mg/dL   Calcium  9.4 8.9 - 10.3 mg/dL   GFR, Estimated 44 (L) >60 mL/min   Anion gap 8 5 - 15     PHYSICAL EXAM:   Gen: NAD, resting comfortably in bed, looks good  Lungs: unlabored Ext:       Left Lower Extremity Dressing with scant strikethrough but stable             Extremity is warm              No DCT             Compartments are soft             No pain out of proportion with passive stretching of toes or ankle             DPN, SPN, TN sensory functions are intact             EHL, FHL, lesser toe motor functions intact             Ankle flexion, extension, inversion eversion intact             + DP pulse  Assessment/Plan: 2 Days Post-Op   Principal Problem:   Closed subcapital fracture of left femur (HCC) Active Problems:   Type 2 diabetes mellitus without complication, without long-term current use of insulin  (HCC)   History of pulmonary embolism   Malignant neoplasm of prostate (HCC)   Fall at home, initial encounter   Fever   Syncope and collapse   Elevated troponin   Hyperkalemia   History of myasthenia gravis   Anti-infectives (From admission, onward)    Start     Dose/Rate Route Frequency Ordered Stop   01/23/25 0745  cefTRIAXone  (ROCEPHIN ) 2 g in sodium chloride  0.9 % 100 mL IVPB        2 g 200 mL/hr over 30 Minutes Intravenous Every 24 hours 01/23/25 0740     01/23/25 0745  metroNIDAZOLE  (FLAGYL ) IVPB 500 mg        500 mg 100 mL/hr over 60 Minutes Intravenous Every 12 hours  01/23/25 0740     01/23/25 0730  cefTRIAXone  (ROCEPHIN ) 1 g in sodium chloride  0.9 % 100 mL IVPB  Status:  Discontinued        1 g 200 mL/hr over 30 Minutes Intravenous  Once 01/23/25 0723 01/23/25 0740     .  POD/HD#: 89  89 year old male ground-level fall with impacted left femoral neck fracture   - fall   - impacted left femoral neck fracture s/p percutaneous fixation with cannulated screws             PWB L leg with assistance (50 % BW)             No ROM restrictions L hip or knee             Dressing changes as needed starting on 01/27/2025                         Mepilex or 4 x 4 gauze and tape             Therapy evaluations                         Agree with CIR consult  Ice as needed for swelling and pain control locally     - Pain management:             Multimodal, minimize narcotics   - ABL anemia/Hemodynamics             H&H stable.  Minimal blood loss with this particular surgery   - Medical issues              Per primary   - DVT/PE prophylaxis:             Treatment dose Xarelto  restarted today   - Metabolic Bone Disease:             Fractures of fragility fracture.  Also with vitamin D  deficiency                         Supplementation restarted   - Impediments to fracture healing:             Fragility fracture                       Vitamin D  deficiency             DM    - Dispo:             Stable for dc from ortho standpoint  Follow up with ortho in 2 weeks for suture removal and follow up landy Francis MICAEL Deward, PA-C [phone] (C) 01/27/2025, 10:36 AM  [facility] [address] [place] [address] [phone] (F)    After 5pm and on the weekends please log on to Amion, go to orthopaedics and the look under the [facility] Call for the provider(s) on call. You can also call our office at [phone] and then follow the prompts to be connected to the call team.   Patient ID: [name]., male   DOB: 1932/10/11, 89 y.o.   MRN: [id number]  "

## 2025-01-27 NOTE — Progress Notes (Signed)
 " Progress Note   Patient: [name]. FMW:[id number] DOB: 09/02/1932 DOA: 01/22/2025     4 DOS: the patient was seen and examined on 01/27/2025   Brief hospital course: 89 y.o. male with medical history significant of HTN, HLD, ocular myasthenia gravis, history of PE on Xarelto , DM 2, CKD stage IIIb, prostate cancer, BPH, and GERD presents with a fall resulting in left hip pain and blackout/presyncope. Pt experienced a fall after standing up, had a presyncopal episode, landing on his left side, which resulted in significant L hip pain. He was also noted to have an episode of vomiting after the fall. In the ED, patient was noted to be febrile up to 101.2 F with respirations 13-23, blood pressures elevated up to 168/92. Labs noted WBC 4.7, hemoglobin 10.4, potassium 5.2, BUN 26, creatinine 1.66, high-sensitivity troponin 48, and lactic acid 1.3.  CT scan of the head did not reveal any acute abnormality.  CT scan of the hip revealed acute transverse oblique subcapital proximal left femoral fracture with slight impaction. CT angiogram of the chest, abdomen, pelvis was obtained noting no pulmonary embolism, mild bladder wall thickening versus underdistention, thickened wall versus underdistention of the ascending and proximal transverse colon possibly reflecting colitis, and bronchitis with scattered subsegmental lower lobe bronchial impactions without infiltrates.  Urinalysis showed no significant signs for infection.  Influenza, COVID-19, and RSV screening were negative.  Blood cultures were obtained. Dr. Kendal of orthopedics was consulted.  Patient admitted for further management.   Assessment and Plan: Left subcapital femur fracture secondary to fall CT of the hip revealed acute transverse oblique subcapital proximal left femoral fracture with slight impaction. Orthopedics consulted, s/p cannulated screw fixation of left femoral neck fracture on 01/25/2025 Continue pain management, DVT ppx as per  orthopedics PT/OT- CIR   Fever  Likely 2/2 colitis vs Aspiration pneumonia (vomited after presyncopal event) Last temp spike 101.2 on 01/23/2025, with no leukocytosis Influenza, COVID-19, RSV, respiratory viral panel screening were negative UA negative for infection Procalcitonin 0.65 BC x 2 NGTD CT imaging noted thickened wall versus underdistention of the ascending and proximal transverse colon possibly reflecting colitis, and bronchitis with scattered subsegmental lower lobe bronchial impactions without infiltrates Continue ceftriaxone , Flagyl  X 5 days Clinically improving   Presyncope He reportedly had stood up after eating, and had a presyncopal episode  Orthostatic hypotension negative Reports remote hx of vertigo CT head unremarkable Echo showed EF of 50 to 55%, noted regional wall motion abnormality, grade 1 diastolic dysfunction, no previous to compare RWMA noted on echo, Cardiology was consulted PT/OT- [facility]   Elevated troponin Chest pain-free High-sensitivity troponin elevated at 48 Echo noted as above Telemetry   HTN BP uncontrolled Likely worsened due to pain Continue amlodipine , Avapro , hydralazine  as needed   Diabetes mellitus type 2 Last available hemoglobin A1c 6.1. Continue to monitor   Chronic kidney disease stage IIIb Baseline creatinine appears to range from 1.4-1.8 BMP in a.m.   History of pulmonary embolism Patient is on Xarelto    History of ocular myasthenia gravis Outpatient follow-up   Vit D def Vit d level 7.9 Started Vit D supplement   Prostate cancer Outpatient follow-up       Subjective: Eager to participate in rehab  Physical Exam: Vitals:   01/26/25 1942 01/27/25 0500 01/27/25 0727 01/27/25 1104  BP: (!) 149/77 130/73 129/82 136/73  Pulse: 68 63 (!) 57 64  Resp: 16 16 18 18   Temp: 98.2 F (36.8 C) 97.7 F (36.5 C) 98.4  F (36.9 C) 97.8 F (36.6 C)  TempSrc:      SpO2: 97% 94% 98% 94%  Weight:      Height:        General exam: Awake, laying in bed, in nad Respiratory system: Normal respiratory effort, no wheezing Cardiovascular system: regular rate, s1, s2 Gastrointestinal system: Soft, nondistended, positive BS Central nervous system: CN2-12 grossly intact, strength intact Extremities: Perfused, no clubbing Skin: Normal skin turgor, no notable skin lesions seen Psychiatry: Mood normal // no visual hallucinations   Data Reviewed:  Labs reviewed: Na 138, K 4.7, Cr 1.49, WBC 3.4, Hgb 10.5, Plts 224  Family Communication: Pt in room, family at bedside  Disposition: Status is: Inpatient Remains inpatient appropriate because: severity of illness  Planned Discharge Destination: Rehab    Author: Garnette Pelt, MD 01/27/2025 3:55 PM  For on call review [email or web address].  "

## 2025-01-27 NOTE — Plan of Care (Signed)

## 2025-01-27 NOTE — Discharge Instructions (Addendum)
 "  Orthopaedic Trauma Service Discharge Instructions   General Discharge Instructions  Orthopaedic Injuries:  Left femoral neck fracture treated with percutaneous fixation using screws  WEIGHT BEARING STATUS: Partial weightbearing left leg with assistance.  Approximately 50% of the body weight.  Will need to use a walker to mobilize  RANGE OF MOTION/ACTIVITY: Unrestricted range of motion of left hip and left knee  Bone health: Labs show vitamin D  deficiency.  Please take vitamin D3 5000 IUs daily  Review the following resource for additional information regarding bone health  [email or web address]  Wound Care: Daily wound care as needed starting upon discharge.  Please see below  Discharge Wound Care Instructions  Do NOT apply any ointments, solutions or lotions to pin sites or surgical wounds.  These prevent needed drainage and even though solutions like hydrogen peroxide kill bacteria, they also damage cells lining the pin sites that help fight infection.  Applying lotions or ointments can keep the wounds moist and can cause them to breakdown and open up as well. This can increase the risk for infection. When in doubt call the office.  Surgical incisions should be dressed daily.  If any drainage is noted, use one layer of adaptic or Mepitel, then gauze, and tape.  Alternatively you can use a silicone foam dressing such as a Mepilex which is what you currently have on  [email or web address]  [email or web address]  These dressing supplies should be available at local medical supply stores ([facility] [employer], [facility] [employer], etc). They are not usually carried at places like [facility], [facility], [facility], etc  Once the incision is completely  dry and without drainage, it may be left open to air out.  Showering may begin 36-48 hours later.  Cleaning gently with soap and water.   Diet: as you were eating previously.  Can use over the counter stool softeners and bowel preparations, such as Miralax , to help with bowel movements.  Narcotics can be constipating.  Be sure to drink plenty of fluids  PAIN MEDICATION USE AND EXPECTATIONS  You have likely been given narcotic medications to help control your pain.  After a traumatic event that results in an fracture (broken bone) with or without surgery, it is ok to use narcotic pain medications to help control one's pain.  We understand that everyone responds to pain differently and each individual patient will be evaluated on a regular basis for the continued need for narcotic medications. Ideally, narcotic medication use should last no more than 6-8 weeks (coinciding with fracture healing).   As a patient it is your responsibility as well to monitor narcotic medication use and report the amount and frequency you use these medications when you come to your office visit.   We would also advise that if you are using narcotic medications, you should take a dose prior to therapy to maximize you participation.  IF YOU ARE ON NARCOTIC MEDICATIONS IT IS NOT PERMISSIBLE TO OPERATE A MOTOR VEHICLE (MOTORCYCLE/CAR/TRUCK/MOPED) OR HEAVY MACHINERY DO NOT MIX NARCOTICS WITH OTHER CNS (CENTRAL NERVOUS SYSTEM) DEPRESSANTS SUCH AS ALCOHOL    POST-OPERATIVE OPIOID TAPER INSTRUCTIONS: It is important to wean off of your opioid medication as soon as possible. If you do not need pain medication after your surgery it is ok to stop day one. Opioids include: Codeine, Hydrocodone (Norco, Vicodin), Oxycodone (Percocet, oxycontin ) and hydromorphone  amongst others.  Long term and even short term use of opiods can cause: Increased pain response Dependence Constipation Depression Respiratory depression And more.   Withdrawal symptoms can include Flu  like symptoms Nausea, vomiting And more Techniques to manage these symptoms Hydrate well Eat regular healthy meals Stay active Use relaxation techniques(deep breathing, meditating, yoga) Do Not substitute Alcohol  to help with tapering If you have been on opioids for less than two weeks and do not have pain than it is ok to stop all together.  Plan to wean off of opioids This plan should start within one week post op of your fracture surgery  Maintain the same interval or time between taking each dose and first decrease the dose.  Cut the total daily intake of opioids by one tablet each day Next start to increase the time between doses. The last dose that should be eliminated is the evening dose.    STOP SMOKING OR USING NICOTINE PRODUCTS!!!!  As discussed nicotine severely impairs your body's ability to heal surgical and traumatic wounds but also impairs bone healing.  Wounds and bone heal by forming microscopic blood vessels (angiogenesis) and nicotine is a vasoconstrictor (essentially, shrinks blood vessels).  Therefore, if vasoconstriction occurs to these microscopic blood vessels they essentially disappear and are unable to deliver necessary nutrients to the healing tissue.  This is one modifiable factor that you can do to dramatically increase your chances of healing your injury.    (This means no smoking, no nicotine gum, patches, etc)  DO NOT USE NONSTEROIDAL ANTI-INFLAMMATORY DRUGS (NSAID'S)  Using products such as Advil (ibuprofen), Aleve (naproxen), Motrin (ibuprofen) for additional pain control during fracture healing can delay and/or prevent the healing response.  If you would like to take over the counter (OTC) medication, Tylenol  (acetaminophen ) is ok.  However, some narcotic medications that are given for pain control contain acetaminophen  as well. Therefore, you should not exceed more than 4000 mg of tylenol  in a day if you do not have  liver disease.  Also note that there are may OTC medicines, such as cold medicines and allergy medicines that my contain tylenol  as well.  If you have any questions about medications and/or interactions please ask your doctor/PA or your pharmacist.      ICE AND ELEVATE INJURED/OPERATIVE EXTREMITY  Using ice and elevating the injured extremity above your heart can help with swelling and pain control.  Icing in a pulsatile fashion, such as 20 minutes on and 20 minutes off, can be followed.    Do not place ice directly on skin. Make sure there is a barrier between to skin and the ice pack.    Using frozen items such as frozen peas works well as the conform nicely to the are that needs to be iced.  USE AN ACE WRAP OR TED HOSE FOR SWELLING CONTROL  In addition to icing and elevation, Ace wraps or TED hose are used to help limit and resolve swelling.  It is recommended to use Ace wraps or TED hose until you are informed to stop.    When using Ace Wraps start the wrapping distally (farthest away from the body) and wrap proximally (closer to the body)   Example: If you had surgery on your leg and you do not have a splint on, start the ace wrap at the toes and work your way up to the thigh        If you had surgery on your upper extremity and do not have a splint on, start the ace wrap at your fingers and work your way up to the upper arm  IF YOU ARE IN A SPLINT OR CAST DO NOT  REMOVE IT FOR ANY REASON   If your splint gets wet for any reason please contact the office immediately. You may shower in your splint or cast as long as you keep it dry.  This can be done by wrapping in a cast cover or garbage back (or similar)  Do Not stick any thing down your splint or cast such as pencils, money, or hangers to try and scratch yourself with.  If you feel itchy take benadryl  as prescribed on the bottle for itching  IF YOU ARE IN A CAM BOOT (BLACK BOOT)  You may remove boot periodically. Perform daily dressing  changes as noted below.  Wash the liner of the boot regularly and wear a sock when wearing the boot. It is recommended that you sleep in the boot until told otherwise    Call office for the following: Temperature greater than 101F Persistent nausea and vomiting Severe uncontrolled pain Redness, tenderness, or signs of infection (pain, swelling, redness, odor or green/yellow discharge around the site) Difficulty breathing, headache or visual disturbances Hives Persistent dizziness or light-headedness Extreme fatigue Any other questions or concerns you may have after discharge  In an emergency, call 911 or go to an Emergency Department at a nearby hospital  HELPFUL INFORMATION  If you had a block, it will wear off between 8-24 hrs postop typically.  This is period when your pain may go from nearly zero to the pain you would have had postop without the block.  This is an abrupt transition but nothing dangerous is happening.  You may take an extra dose of narcotic when this happens.  You should wean off your narcotic medicines as soon as you are able.  Most patients will be off or using minimal narcotics before their first postop appointment.   We suggest you use the pain medication the first night prior to going to bed, in order to ease any pain when the anesthesia wears off. You should avoid taking pain medications on an empty stomach as it will make you nauseous.  Do not drink alcoholic beverages or take illicit drugs when taking pain medications.  In most states it is against the law to drive while you are in a splint or sling.  And certainly against the law to drive while taking narcotics.  You may return to work/school in the next couple of days when you feel up to it.   Pain medication may make you constipated.  Below are a few solutions to try in this order: Decrease the amount of pain medication if you arent having pain. Drink lots of decaffeinated fluids. Drink prune juice  and/or each dried prunes  If the first 3 dont work start with additional solutions Take Colace - an over-the-counter stool softener Take Senokot - an over-the-counter laxative Take Miralax  - a stronger over-the-counter laxative     CALL THE OFFICE WITH ANY QUESTIONS OR CONCERNS: [phone]   VISIT OUR WEBSITE FOR ADDITIONAL INFORMATION: [email or web address]      "

## 2025-01-27 NOTE — Plan of Care (Signed)
  Problem: Activity: Goal: Risk for activity intolerance will decrease Outcome: Progressing   Problem: Coping: Goal: Level of anxiety will decrease Outcome: Progressing   Problem: Pain Managment: Goal: General experience of comfort will improve and/or be controlled Outcome: Progressing   Problem: Safety: Goal: Ability to remain free from injury will improve Outcome: Progressing

## 2025-01-27 NOTE — Progress Notes (Signed)
 Physical Therapy Treatment Patient Details Name: [name]. MRN: [id number] DOB: Jul 29, 1932 Today's Date: 01/27/2025   History of Present Illness Pt is a 89 y.o. M who presents 01/22/2025 with a fall after standing up and presyncopal episode. Pt found to have closed subcapital fracture of left femur now s/p cannulated screw fixation of left femoral neck fracture 01/25/2025. PMH includes PE, myasthenia gravis (ocular), DM type II, CKD stage II, aortic artherosclerosis, HTN, HLD, osteoarthritis, iliac artery aneurysm, prostate CA.    PT Comments  Pt making steady progress towards his physical therapy goals and is motivated to participate. Pt premedicated prior to session and reports good pain control. Pt performed warm up AROM exercises with LLE at bed level. Pt ambulating ~60 ft with RW with step to pattern; consistent cueing for weightbearing precautions and technique. HR 70's. Patient will benefit from intensive inpatient follow-up therapy, >3 hours/day to address deficits and maximize functional mobility.     If plan is discharge home, recommend the following: A little help with walking and/or transfers;A little help with bathing/dressing/bathroom;Assistance with cooking/housework;Assist for transportation;Help with stairs or ramp for entrance   Can travel by private vehicle        Equipment Recommendations  None recommended by PT (pt has RW, 3 in 1)    Recommendations for Other Services       Precautions / Restrictions Precautions Precautions: Fall Precaution/Restrictions Comments: No ROM restrictions L hip/knee Restrictions Weight Bearing Restrictions Per Provider Order: Yes LLE Weight Bearing Per Provider Order: Partial weight bearing LLE Partial Weight Bearing Percentage or Pounds: 50 Other Position/Activity Restrictions: Discussed WB restrictions with [name] 2/3     Mobility  Bed Mobility Overal bed mobility: Needs Assistance Bed Mobility: Supine to Sit     Supine  to sit: Contact guard          Transfers Overall transfer level: Needs assistance Equipment used: Rolling walker (2 wheels) Transfers: Sit to/from Stand Sit to Stand: Contact guard assist           General transfer comment: Verbal cues for bringing LLE anteriorly prior to transition to standing or sitting    Ambulation/Gait Ambulation/Gait assistance: Contact guard assist Gait Distance (Feet): 60 Feet Assistive device: Rolling walker (2 wheels) Gait Pattern/deviations: Step-to pattern Gait velocity: decreased     General Gait Details: Multimodal cueing for sequencing/technique, weightbearing precautions   Stairs             Wheelchair Mobility     Tilt Bed    Modified Rankin (Stroke Patients Only)       Balance Overall balance assessment: Needs assistance Sitting-balance support: Feet supported Sitting balance-Leahy Scale: Fair     Standing balance support: Bilateral upper extremity supported Standing balance-Leahy Scale: Poor                              Communication Communication Communication: Impaired Factors Affecting Communication: Hearing impaired  Cognition Arousal: Alert Behavior During Therapy: WFL for tasks assessed/performed   PT - Cognitive impairments: No apparent impairments                         Following commands: Intact      Cueing Cueing Techniques: Verbal cues, Tactile cues, Visual cues  Exercises General Exercises - Lower Extremity Heel Slides: AROM, Left, 10 reps, Supine Hip ABduction/ADduction: AROM, Left, 10 reps, Supine Straight Leg Raises: AROM, Left, 10 reps, Supine  General Comments        Pertinent Vitals/Pain Pain Assessment Pain Assessment: Faces Faces Pain Scale: Hurts a little bit Pain Location: L hip Pain Descriptors / Indicators: Operative site guarding Pain Intervention(s): Monitored during session, Premedicated before session    Home Living                           Prior Function            PT Goals (current goals can now be found in the care plan section) Acute Rehab PT Goals Patient Stated Goal: go home PT Goal Formulation: With patient/family Time For Goal Achievement: 02/09/25 Potential to Achieve Goals: Good Progress towards PT goals: Progressing toward goals    Frequency    Min 3X/week      PT Plan      Co-evaluation              AM-PAC PT 6 Clicks Mobility   Outcome Measure  Help needed turning from your back to your side while in a flat bed without using bedrails?: A Little Help needed moving from lying on your back to sitting on the side of a flat bed without using bedrails?: A Little Help needed moving to and from a bed to a chair (including a wheelchair)?: A Little Help needed standing up from a chair using your arms (e.g., wheelchair or bedside chair)?: A Little Help needed to walk in hospital room?: A Little Help needed climbing 3-5 steps with a railing? : A Lot 6 Click Score: 17    End of Session Equipment Utilized During Treatment: Gait belt Activity Tolerance: Patient tolerated treatment well Patient left: with call bell/phone within reach;in bed;with bed alarm set;with family/visitor present Nurse Communication: Mobility status PT Visit Diagnosis: Unsteadiness on feet (R26.81);History of falling (Z91.81);Difficulty in walking, not elsewhere classified (R26.2);Pain Pain - Right/Left: Left Pain - part of body: Hip     Time: 8881-8844 PT Time Calculation (min) (ACUTE ONLY): 37 min  Charges:    $Therapeutic Activity: 23-37 mins PT General Charges $$ ACUTE PT VISIT: 1 Visit                     Aleck Daring, PT, DPT Acute Rehabilitation Services Office [phone]    Aleck ONEIDA Daring 01/27/2025, 1:18 PM

## 2025-01-27 NOTE — PMR Pre-admission (Signed)
 PMR Admission Coordinator Pre-Admission Assessment  Patient: Thomas Tiggs. is an 89 y.o., male MRN: [id number] DOB: [birth year] Height: 5' 11 (180.3 cm) Weight: 79.4 kg  Insurance Information HMO:     PPO:      PCP:      IPA:      80/20: yes     OTHER:  PRIMARY: Medicare Part A and B          Policy#: [id number] subscriber: pt Benefits:  Phone #: passport one source     Name:  Eff. Date: 04/24/1999     Deduct: $1676      Out of Pocket Max: none      Life Max: none CIR: 100%      SNF: 20 full days Outpatient: 80%     Co-Pay: 20% Home Health: 100%      Co-Pay:  DME: 80%     Co-Pay: 20% Providers: pt choice  SECONDARY: A[employer]      Policy#: [id number]      Phone#:   Financial Counselor:       Phone#:   The Data Collection Information Summary for patients in Inpatient Rehabilitation Facilities with attached Privacy Act Statement-Health Care Records was provided and verbally reviewed with:   Emergency Contact Information Contact Information     Name Relation Home Work Mobile   [place] Spouse [phone]  [phone]   [place]k, Conant   [phone]      Other Contacts     Name Relation Home Work Mobile   Gooch,Bettie Daughter   [phone]       Current Medical History  Patient Admitting Diagnosis: L Femur fx  History of Present Illness: Thomas Shugars. is a 89 y.o. male with medical history significant of hypertension, hyperlipidemia, ocular myasthenia gravis, history of PE on Xarelto , diabetes mellitus type 2, chronic kidney disease stage IIIb, prostate cancer, BPH, and GERD presented to [facility] 01/23/25 with a fall resulting in left hip pain and blackout. He is accompanied by his daughter-in-law.  He has a history of prostate cancer, for which he underwent forty treatments, and kidney stones. He is currently on Xarelto  for anticoagulation due to a past pulmonary embolism and takes a new blood pressure medication as needed.   In the  emergency department patient was noted to be febrile up to 101.2 F with respirations 13-23, blood pressures elevated up to 168/92, and O2 saturations noted to be as low as 91% which he was placed on 2 L nasal cannula oxygen.  Labs noted  WBC 4.7, hemoglobin 10.4, potassium 5.2, BUN 26, creatinine 1.66, high-sensitivity troponin 48, and lactic acid 1.3.  CT scan of the head did not reveal any acute abnormality.  CT scan of the hip revealed acute transverse oblique subcapital proximal left femoral fracture with slight impaction, left inguinal hernia containing nonobstructed loop of small bowel, and new fiduciary markers in the prostate.  CT angiogram of the chest, abdomen, pelvis was obtained noting no pulmonary embolism, mild bladder wall thickening versus underdistention, thickened wall versus underdistention of the ascending and proximal transverse colon possibly reflecting colitis, and bronchitis with scattered subsegmental lower lobe bronchial impactions without infiltrates.  Urinalysis showed no significant signs for infection.  Influenza, COVID-19, and RSV screening were negative.  Blood cultures were obtained.  Patient had been given 1 L normal saline IV fluids, Zofran , fentanyl  50 mcg IV, and oxycodone  5 mg.  Pt found to have closed subcapital fracture of left femur now s/p cannulated screw  fixation of left femoral neck fracture 01/25/2025. Pt. Seen by PT/OT and they recommend CIR to assist return to PLOF.  Patient's medical record from [facility] has been reviewed by the rehabilitation admission coordinator and physician.  Past Medical History  Past Medical History:  Diagnosis Date   Aortic atherosclerosis    Chest pain    Chronic kidney disease (CKD), stage II (mild)    Coronary atherosclerosis    DM2 (diabetes mellitus, type 2) (HCC)    Elevated PSA    GERD (gastroesophageal reflux disease)    Hydronephrosis    on left   Hyperlipidemia    Hypertension    Iliac artery  aneurysm    Kidney stone    Myasthenia gravis (HCC) 03/29/2017   Ocular myasthenia gravis (HCC)    Ophthalmoplegia 2009   Diplopia-intranuclear   Osteoarthritis    Pulmonary embolism (HCC)     Has the patient had major surgery during 100 days prior to admission? Yes  Family History   family history includes Heart attack in his father; Sarcoidosis in his brother.  Current Medications Current Medications[1]  Patients Current Diet:  Diet Order             Diet Carb Modified Room service appropriate? Yes  Diet effective now                   Precautions / Restrictions Precautions Precautions: Fall Precaution/Restrictions Comments: No ROM restrictions L hip/knee Restrictions Weight Bearing Restrictions Per Provider Order: Yes LLE Weight Bearing Per Provider Order: Partial weight bearing LLE Partial Weight Bearing Percentage or Pounds: 50 Other Position/Activity Restrictions: Discussed WB restrictions with [name] 2/3   Has the patient had 2 or more falls or a fall with injury in the past year? Yes  Prior Activity Level Community (5-7x/wk): Pt. active in the community PTA  Prior Functional Level Self Care: Did the patient need help bathing, dressing, using the toilet or eating? Independent  Indoor Mobility: Did the patient need assistance with walking from room to room (with or without device)? Independent  Stairs: Did the patient need assistance with internal or external stairs (with or without device)? Independent  Functional Cognition: Did the patient need help planning regular tasks such as shopping or remembering to take medications? Independent  Patient Information Are you of Hispanic, Latino/a,or Spanish origin?: A. No, not of Hispanic, Latino/a, or Spanish origin What is your race?: B. Black or African American Do you need or want an interpreter to communicate with a doctor or health care staff?: 0. No  Patient's Response To:  Health Literacy and  Transportation Is the patient able to respond to health literacy and transportation needs?: Yes Health Literacy - How often do you need to have someone help you when you read instructions, pamphlets, or other written material from your doctor or pharmacy?: Never In the past 12 months, has lack of transportation kept you from medical appointments or from getting medications?: No In the past 12 months, has lack of transportation kept you from meetings, work, or from getting things needed for daily living?: No  Journalist, Newspaper / Equipment Home Equipment: Grab bars - tub/shower, Shower seat - built in, BSC/3in1  Prior Device Use: Indicate devices/aids used by the patient prior to current illness, exacerbation or injury? None of the above  Current Functional Level Cognition  Orientation Level: Oriented X4    Extremity Assessment (includes Sensation/Coordination)  Upper Extremity Assessment: Overall WFL for tasks assessed  Lower Extremity Assessment: Defer to PT evaluation LLE Deficits / Details: L fem neck fx s/p screw fixation. Able to perform heel slide, ankle DF WFL    ADLs  Grooming: Wash/dry hands, Wash/dry face, Set up, Sitting Lower Body Dressing: Moderate assistance, Sit to/from stand Toilet Transfer: Minimal assistance, Rolling walker (2 wheels), Regular Toilet, Ambulation    Mobility  Overal bed mobility: Needs Assistance Bed Mobility: Supine to Sit Supine to sit: Supervision Sit to supine: Min assist General bed mobility comments: HOB elevated 16 degrees, increased time    Transfers  Overall transfer level: Needs assistance Equipment used: Rolling walker (2 wheels) Transfers: Sit to/from Stand Sit to Stand: Contact guard assist General transfer comment: Verbal cues for bringing LLE anteriorly prior to transition to standing or sitting    Ambulation / Gait / Stairs / Wheelchair Mobility  Ambulation/Gait Ambulation/Gait assistance: Contact guard assist Gait  Distance (Feet): 90 Feet Assistive device: Rolling walker (2 wheels) Gait Pattern/deviations: Step-to pattern General Gait Details: Slowed step to pattern. Verbal cues for walker use, weightbearing precautions, upward gaze Gait velocity: decreased Gait velocity interpretation: <1.31 ft/sec, indicative of household ambulator    Posture / Balance Balance Overall balance assessment: Needs assistance Sitting-balance support: Feet supported Sitting balance-Leahy Scale: Fair Standing balance support: Bilateral upper extremity supported Standing balance-Leahy Scale: Poor    Special considerations/life events  Skin post op left hip surgical wound with dressing   Previous Home Environment (from acute therapy documentation) Living Arrangements: Spouse/significant other  Lives With: Spouse Available Help at Discharge: Family Type of Home: House Home Layout: One level Home Access: Stairs to enter Entrance Stairs-Rails: Right Entrance Stairs-Number of Steps: 3 Bathroom Shower/Tub: Health Visitor: Standard Bathroom Accessibility: Yes How Accessible: Accessible via walker Home Care Services: No  Discharge Living Setting Plans for Discharge Living Setting: Patient's home Type of Home at Discharge: House Discharge Home Layout: One level Discharge Home Access: Stairs to enter Entrance Stairs-Rails: Right Entrance Stairs-Number of Steps: 3 Discharge Bathroom Shower/Tub: Walk-in shower Discharge Bathroom Toilet: Standard Discharge Bathroom Accessibility: Yes Does the patient have any problems obtaining your medications?: Yes (Describe) (Xarelto  is expensive)  Social/Family/Support Systems Patient Roles: Spouse Contact Information: [phone] Anticipated Caregiver: Thelma Anticipated Industrial/product Designer Information: Wife 24/7 supervision, son to stay for a while after dc to assist Caregiver Availability: 24/7 Discharge Plan Discussed with Primary Caregiver: Yes Is  Caregiver In Agreement with Plan?: Yes Does Caregiver/Family have Issues with Lodging/Transportation while Pt is in Rehab?: Yes  Goals Patient/Family Goal for Rehab: PT/OT Supervision level Expected length of stay: 5-7 days Pt/Family Agrees to Admission and willing to participate: Yes Program Orientation Provided & Reviewed with Pt/Caregiver Including Roles  & Responsibilities: Yes  Decrease burden of Care through IP rehab admission: not anticipated  Possible need for SNF placement upon discharge: not anticipated  Patient Condition: I have reviewed medical records from [facility], spoken with CM, and patient and spouse. I met with patient at the bedside for inpatient rehabilitation assessment.  Patient will benefit from ongoing PT and OT, can actively participate in 3 hours of therapy a day 5 days of the week, and can make measurable gains during the admission.  Patient will also benefit from the coordinated team approach during an Inpatient Acute Rehabilitation admission.  The patient will receive intensive therapy as well as Rehabilitation physician, nursing, social worker, and care management interventions.  Due to safety, skin/wound care, disease management, medication administration, pain management, and patient education the patient  requires 24 hour a day rehabilitation nursing.  The patient is currently CGA to min assist with mobility and basic ADLs.  Discharge setting and therapy post discharge at home with home health is anticipated.  Patient has agreed to participate in the Acute Inpatient Rehabilitation Program and will admit today.  Preadmission Screen Completed By:  Lovett CHRISTELLA Ropes, 01/28/2025 10:49 AM ______________________________________________________________________   Discussed status with Dr. Babs on 01/28/25 at 0930 and received approval for admission today.  Admission Coordinator:  Lovett CHRISTELLA Ropes, RN, time 1105/Date 01/28/25   Assessment/Plan: Diagnosis: L  subcapital fracture of L femur Does the need for close, 24 hr/day Medical supervision in concert with the patient's rehab needs make it unreasonable for this patient to be served in a less intensive setting? Yes Co-Morbidities requiring supervision/potential complications: DM A1c 6.1, HTN, HLD; ocular Myasthenia gravis; prostate CA; syncope vs presyncope, CKD3B, hx of PE on Xarelto  Due to bladder management, bowel management, safety, skin/wound care, disease management, medication administration, pain management, and patient education, does the patient require 24 hr/day rehab nursing? Yes Does the patient require coordinated care of a physician, rehab nurse, PT, OT,  to address physical and functional deficits in the context of the above medical diagnosis(es)? Yes Addressing deficits in the following areas: balance, endurance, locomotion, strength, transferring, bowel/bladder control, bathing, dressing, feeding, grooming, and toileting Can the patient actively participate in an intensive therapy program of at least 3 hrs of therapy 5 days a week? Yes The potential for patient to make measurable gains while on inpatient rehab is good Anticipated functional outcomes upon discharge from inpatient rehab: supervision PT, supervision OT, n/a SLP Estimated rehab length of stay to reach the above functional goals is: 5-7 days- PWB 50% on LLE Anticipated discharge destination: Home 10. Overall Rehab/Functional Prognosis: good   MD Signature:      [1]  Current Facility-Administered Medications:    amLODipine  (NORVASC ) tablet 10 mg, 10 mg, Oral, Daily, Ezenduka, Nkeiruka J, MD, 10 mg at 01/27/25 9043   cefTRIAXone  (ROCEPHIN ) 2 g in sodium chloride  0.9 % 100 mL IVPB, 2 g, Intravenous, Q24H, Deward Eck, PA-C, Last Rate: 200 mL/hr at 01/27/25 0742, 2 g at 01/27/25 9257   docusate sodium  (COLACE) capsule 100 mg, 100 mg, Oral, BID, Deward Eck, PA-C, 100 mg at 01/27/25 9043   hydrALAZINE  (APRESOLINE ) tablet  10 mg, 10 mg, Oral, PRN, Deward Eck, PA-C, 10 mg at 01/25/25 0600   HYDROcodone -acetaminophen  (NORCO/VICODIN) 5-325 MG per tablet 1-2 tablet, 1-2 tablet, Oral, Q6H PRN, Deward Eck, PA-C, 1 tablet at 01/28/25 9352   irbesartan  (AVAPRO ) tablet 75 mg, 75 mg, Oral, Daily, Deward Eck, PA-C, 75 mg at 01/27/25 9043   menthol  (CEPACOL) lozenge 3 mg, 1 lozenge, Oral, PRN **OR** phenol (CHLORASEPTIC) mouth spray 1 spray, 1 spray, Mouth/Throat, PRN, Deward Eck, PA-C   methocarbamol  (ROBAXIN ) tablet 500 mg, 500 mg, Oral, Q6H PRN, 500 mg at 01/26/25 0840 **OR** methocarbamol  (ROBAXIN ) injection 500 mg, 500 mg, Intravenous, Q6H PRN, Deward Eck, PA-C   metoCLOPramide  (REGLAN ) tablet 5-10 mg, 5-10 mg, Oral, Q8H PRN **OR** metoCLOPramide  (REGLAN ) injection 5-10 mg, 5-10 mg, Intravenous, Q8H PRN, Deward Eck, PA-C   metroNIDAZOLE  (FLAGYL ) IVPB 500 mg, 500 mg, Intravenous, Q12H, Deward Eck, PA-C, Last Rate: 100 mL/hr at 01/28/25 0647, 500 mg at 01/28/25 9352   morphine  (PF) 2 MG/ML injection 0.5 mg, 0.5 mg, Intravenous, Q2H PRN, Deward Eck, PA-C   ondansetron  (ZOFRAN ) tablet 4 mg, 4 mg, Oral, Q6H PRN **OR** ondansetron  (ZOFRAN ) injection  4 mg, 4 mg, Intravenous, Q6H PRN, Deward Eck, PA-C, 4 mg at 01/26/25 1736   pantoprazole  (PROTONIX ) EC tablet 40 mg, 40 mg, Oral, Daily, Ezenduka, Nkeiruka J, MD, 40 mg at 01/27/25 9043   polyethylene glycol (MIRALAX  / GLYCOLAX ) packet 17 g, 17 g, Oral, BID, Ezenduka, Nkeiruka J, MD, 17 g at 01/27/25 9044   pravastatin  (PRAVACHOL ) tablet 40 mg, 40 mg, Oral, q morning, Dunn, Dayna N, PA-C   rivaroxaban  (XARELTO ) tablet 20 mg, 20 mg, Oral, Q breakfast, Ezenduka, Nkeiruka J, MD, 20 mg at 01/27/25 0730   Vitamin D  (Ergocalciferol ) (DRISDOL ) 1.25 MG (50000 UNIT) capsule 50,000 Units, 50,000 Units, Oral, Q7 days, Deward Eck, PA-C, 50,000 Units at 01/27/25 [phone]

## 2025-01-27 NOTE — Hospital Course (Signed)
 89 y.o. male with medical history significant of HTN, HLD, ocular myasthenia gravis, history of PE on Xarelto , DM 2, CKD stage IIIb, prostate cancer, BPH, and GERD presents with a fall resulting in left hip pain and blackout/presyncope. Pt experienced a fall after standing up, had a presyncopal episode, landing on his left side, which resulted in significant L hip pain. He was also noted to have an episode of vomiting after the fall. In the ED, patient was noted to be febrile up to 101.2 F with respirations 13-23, blood pressures elevated up to 168/92. Labs noted WBC 4.7, hemoglobin 10.4, potassium 5.2, BUN 26, creatinine 1.66, high-sensitivity troponin 48, and lactic acid 1.3.  CT scan of the head did not reveal any acute abnormality.  CT scan of the hip revealed acute transverse oblique subcapital proximal left femoral fracture with slight impaction. CT angiogram of the chest, abdomen, pelvis was obtained noting no pulmonary embolism, mild bladder wall thickening versus underdistention, thickened wall versus underdistention of the ascending and proximal transverse colon possibly reflecting colitis, and bronchitis with scattered subsegmental lower lobe bronchial impactions without infiltrates.  Urinalysis showed no significant signs for infection.  Influenza, COVID-19, and RSV screening were negative.  Blood cultures were obtained. Dr. Kendal of orthopedics was consulted.  Patient admitted for further management.

## 2025-01-27 NOTE — Consult Note (Signed)
 "  Cardiology Consultation   Patient ID: [name]. MRN: [id number]; DOB: 1932/08/05  Admit date: 01/22/2025 Date of Consult: 01/27/2025  PCP:  Shepard Ade, MD   [facility] Providers Cardiologist:  None        Patient Profile: [name]. is a 89 y.o. 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 Cindy.  History of Present Illness: [name] presented 01/23/2025 with a fall after standing.  He reportedly passed out and landed on his left side.  He was found to have an acute fracture of the left femur there is also a question of colitis in the proximal transverse colon.  He was started on IV fluids.  He was noted to have an episode of nausea and vomiting the day prior.  In the ED he was also mildly febrile to 101.2.  Blood pressure was 168/92.  The etiology of his fall was thought to be orthostasis in the setting of recent viral illness.  CT was negative for pulmonary embolism.  Orthostatic vital signs were negative.  High-sensitivity troponin was mildly elevated at 48 and flat.  Echocardiogram revealed LVEF 50-55% with GLS -17.7.  He had grade 1 diastolic dysfunction.  Given the wall motion abnormality in the basal nferior and inferoseptal myocardium called on echo cardiology was consulted.  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  [name] underwent successful ORIF of the left femur on 01/23/2025.  At baseline [name] is very active.  He golfs regularly when it is not too warm and is able to job up the hill.  He denies chest pain or shortness of breath.  He has no LE edema, orthopnea or PND.  He notes occasional lightheadedness upon standing but no syncope.   Past Medical History:  Diagnosis Date   Aortic atherosclerosis    Chest pain    Chronic kidney disease (CKD), stage  II (mild)    Coronary atherosclerosis    DM2 (diabetes mellitus, type 2) (HCC)    Elevated PSA    GERD (gastroesophageal reflux disease)    Hydronephrosis    on left   Hyperlipidemia    Hypertension    Iliac artery aneurysm    Kidney stone    Myasthenia gravis (HCC) 03/29/2017   Ocular myasthenia gravis (HCC)    Ophthalmoplegia 2009   Diplopia-intranuclear   Osteoarthritis    Pulmonary embolism (HCC)     Past Surgical History:  Procedure Laterality Date   GOLD SEED IMPLANT N/A 04/17/2024   Procedure: INSERTION, GOLD SEEDS;  Surgeon: Elisabeth Valli BIRCH, MD;  Location: [facility] OR;  Service: Urology;  Laterality: N/A;   KNEE SURGERY     LITHOTRIPSY     PROSTATE BIOPSY     PROSTATE SURGERY     SPACE OAR INSTILLATION N/A 04/17/2024   Procedure: INJECTION, HYDROGEL SPACER;  Surgeon: Elisabeth Valli BIRCH, MD;  Location: [facility] OR;  Service: Urology;  Laterality: N/A;     Home Medications:  Prior to Admission medications  Medication Sig Start Date End Date Taking? Authorizing Provider  albuterol  (VENTOLIN  HFA) 108 (90 Base) MCG/ACT inhaler Inhale 2 puffs into the lungs every 6 (six) hours as needed for wheezing. 03/04/20  Yes [provider]  amLODipine  (NORVASC ) 10 MG tablet Take 1 tablet (10 mg total) by mouth daily. 11/29/21  Yes Regalado, Belkys A, MD  Ascorbic Acid (VITAMIN C PO) Take 1 tablet by mouth See admin instructions. Take one tablet by mouth with iron (ferrous sulfate) - occasionally   Yes [provider]  FERROUS SULFATE PO Take 1 tablet by mouth See admin instructions. Take one tablet by mouth with vitamin c - occasionally   Yes [provider]  hydrALAZINE  (APRESOLINE ) 10 MG tablet Take 10 mg by mouth as needed (only take as needed if bp is 170/100.). 01/19/25  Yes [provider]  metFORMIN (GLUCOPHAGE-XR) 500 MG 24 hr tablet Take 500 mg by mouth in the morning and at bedtime. 08/23/23  Yes [provider]  ORGOVYX 120 MG tablet Take 120 mg  by mouth daily. 04/06/24  Yes [provider]  pantoprazole  (PROTONIX ) 40 MG tablet Take 1 tablet (40 mg total) by mouth 2 (two) times daily before a meal. Patient taking differently: Take 40 mg by mouth daily. 11/28/21  Yes Regalado, Belkys A, MD  polyethylene glycol (MIRALAX  / GLYCOLAX ) 17 g packet Take 17 g by mouth 2 (two) times daily. 11/28/21  Yes Regalado, Belkys A, MD  potassium chloride (KLOR-CON) 10 MEQ tablet Take 10 mEq by mouth daily. 09/11/23  Yes [provider]  pravastatin  (PRAVACHOL ) 20 MG tablet Take 20 mg by mouth every morning.   Yes [provider]  telmisartan (MICARDIS) 20 MG tablet Take 20 mg by mouth at bedtime. 01/20/25  Yes [provider]  TRELEGY ELLIPTA 100-62.5-25 MCG/INH AEPB Inhale 1 puff into the lungs daily as needed (shortness of breath/wheezing). 04/12/21  Yes [provider]  XARELTO  20 MG TABS tablet Take 20 mg by mouth every morning. 05/29/21  Yes [provider]    Scheduled Meds:  amLODipine   10 mg Oral Daily   docusate sodium   100 mg Oral BID   irbesartan   75 mg Oral Daily   pantoprazole   40 mg Oral Daily   polyethylene glycol  17 g Oral BID   pravastatin   20 mg Oral q morning   rivaroxaban   20 mg Oral Q breakfast   Vitamin D  (Ergocalciferol )  50,000 Units Oral Q7 days   Continuous Infusions:  cefTRIAXone  (ROCEPHIN )  IV 2 g (01/27/25 0742)   metronidazole  500 mg (01/27/25 0729)   PRN Meds: hydrALAZINE , HYDROcodone -acetaminophen , menthol  **OR** phenol, methocarbamol  **OR** methocarbamol  (ROBAXIN ) injection, metoCLOPramide  **OR** metoCLOPramide  (REGLAN ) injection, morphine  injection, ondansetron  **OR** ondansetron  (ZOFRAN ) IV  Allergies:   Allergies[1]  Social History:   Social History   Socioeconomic History   Marital status: Married    Spouse name: Not on file   Number of children: 3   Years of education: 12   Highest education level: Not on file  Occupational History   Occupation: Self  employed  Tobacco Use   Smoking status: Former    Types: Cigarettes   Smokeless tobacco: Former    Types: Chew   Tobacco comments:    Quit about 30 years ago  Advertising Account Planner   Vaping status: Never Used  Substance and Sexual Activity   Alcohol  use: No   Drug use: No   Sexual activity: Not Currently  Other Topics Concern   Not on file  Social History Narrative   Lives w/ wife   Caffeine use: Coffee ocass   Social Drivers of Health   Tobacco Use: Medium Risk (01/25/2025)   Patient History    Smoking Tobacco Use: Former    Smokeless Tobacco Use: Former    Passive Exposure:  Not on file  Financial Resource Strain: Not on file  Food Insecurity: No Food Insecurity (01/23/2025)   Epic    Worried About Programme Researcher, Broadcasting/film/video in the Last Year: Never true    Ran Out of Food in the Last Year: Never true  Transportation Needs: No Transportation Needs (01/23/2025)   Epic    Lack of Transportation (Medical): No    Lack of Transportation (Non-Medical): No  Physical Activity: Not on file  Stress: Not on file  Social Connections: Socially Integrated (01/24/2025)   Social Connection and Isolation Panel    Frequency of Communication with Friends and Family: More than three times a week    Frequency of Social Gatherings with Friends and Family: Three times a week    Attends Religious Services: More than 4 times per year    Active Member of Clubs or Organizations: Yes    Attends Banker Meetings: More than 4 times per year    Marital Status: Married  Catering Manager Violence: Unknown (01/23/2025)   Epic    Fear of Current or Ex-Partner: No    Emotionally Abused: No    Physically Abused: Not on file    Sexually Abused: No  Depression (PHQ2-9): Low Risk (01/14/2024)   Depression (PHQ2-9)    PHQ-2 Score: 1  Alcohol  Screen: Low Risk (01/14/2024)   Alcohol  Screen    Last Alcohol  Screening Score (AUDIT): 0  Housing: Low Risk (01/23/2025)   Epic    Unable to Pay for Housing in the Last  Year: No    Number of Times Moved in the Last Year: 0    Homeless in the Last Year: No  Utilities: Not At Risk (01/23/2025)   Epic    Threatened with loss of utilities: No  Health Literacy: Not on file    Family History:    Family History  Problem Relation Age of Onset   Heart attack Father    Sarcoidosis Brother      ROS:  Please see the history of present illness.   All other ROS reviewed and negative.     Physical Exam/Data: Vitals:   01/27/25 0500 01/27/25 0727 01/27/25 1104 01/27/25 1633  BP: 130/73 129/82 136/73 117/62  Pulse: 63 (!) 57 64 67  Resp: 16 18 18 18   Temp: 97.7 F (36.5 C) 98.4 F (36.9 C) 97.8 F (36.6 C) 98.2 F (36.8 C)  TempSrc:      SpO2: 94% 98% 94% 99%  Weight:      Height:        Intake/Output Summary (Last 24 hours) at 01/27/2025 1644 Last data filed at 01/27/2025 0700 Gross per 24 hour  Intake --  Output 500 ml  Net -500 ml      01/25/2025    6:48 AM 01/22/2025    8:35 PM 04/17/2024    9:51 AM  Last 3 Weights  Weight (lbs) 175 lb 175 lb 170 lb  Weight (kg) 79.379 kg 79.379 kg 77.111 kg     VS:  BP 111/71 (BP Location: Left Arm)   Pulse 67   Temp 98.2 F (36.8 C)   Resp 17   Ht 5' 11 (1.803 m)   Wt 79.4 kg   SpO2 97%   BMI 24.41 kg/m  , BMI Body mass index is 24.41 kg/m. GENERAL:  Well appearing HEENT: Pupils equal round and reactive, fundi not visualized, oral mucosa unremarkable NECK:  No jugular venous distention, waveform within normal limits, carotid upstroke brisk  and symmetric, no bruits, no thyromegaly LUNGS:  Clear to auscultation bilaterally HEART:  RRR.  PMI not displaced or sustained,S1 and S2 within normal limits, no S3, no S4, no clicks, no rubs, no murmurs ABD:  Flat, positive bowel sounds normal in frequency in pitch, no bruits, no rebound, no guarding, no midline pulsatile mass, no hepatomegaly, no splenomegaly EXT:  2 plus pulses throughout, no edema, no cyanosis no clubbing SKIN:  No rashes no  nodules NEURO:  Cranial nerves II through XII grossly intact, motor grossly intact throughout PSYCH:  Cognitively intact, oriented to person place and time   EKG:  The EKG was personally reviewed and demonstrates:  Sinus rhythm.  Rate  Telemetry:  Telemetry was personally reviewed and demonstrates:  Sinus rhythm.  No events.   Relevant CV Studies:  Echo 01/25/25:  1. Left ventricular ejection fraction, by estimation, is 50 to 55%. The  left ventricle has low normal function. The left ventricle demonstrates  regional wall motion abnormalities (see scoring diagram/findings for  description). Left ventricular diastolic   parameters are consistent with Grade I diastolic dysfunction (impaired  relaxation). The average left ventricular global longitudinal strain is  -17.7 %. The global longitudinal strain is normal.   2. Right ventricular systolic function is normal. The right ventricular  size is normal.   3. Left atrial size was mildly dilated.   4. The mitral valve is normal in structure. No evidence of mitral valve  regurgitation. No evidence of mitral stenosis.   5. The aortic valve is tricuspid. Aortic valve regurgitation is not  visualized. Aortic valve sclerosis/calcification is present, without any  evidence of aortic stenosis.   6. The inferior vena cava is normal in size with greater than 50%  respiratory variability, sugge   Laboratory Data: High Sensitivity Troponin:  No results for input(s): TROPONINIHS in the last 720 hours.  Recent Labs  Lab 01/22/25 2355  TRNPT 48*      Chemistry Recent Labs  Lab 01/25/25 0521 01/26/25 0601 01/27/25 0540  NA 138 139 138  K 4.7 5.0 4.7  CL 103 105 104  CO2 23 27 25   GLUCOSE 93 112* 96  BUN 27* 25* 29*  CREATININE 1.67* 1.58* 1.49*  CALCIUM  9.0 9.2 9.4  GFRNONAA 39* 41* 44*  ANIONGAP 11 7 8     Recent Labs  Lab 01/22/25 2355  PROT 6.7  ALBUMIN 3.8  AST 20  ALT 15  ALKPHOS 102  BILITOT 0.4   Lipids No results for  input(s): CHOL, TRIG, HDL, LABVLDL, LDLCALC, CHOLHDL in the last 168 hours.  Hematology Recent Labs  Lab 01/25/25 0521 01/26/25 0601 01/27/25 0540  WBC 4.3 4.9 3.4*  RBC 3.21* 3.25* 3.41*  HGB 9.9* 10.2* 10.5*  HCT 30.2* 30.2* 32.3*  MCV 94.1 92.9 94.7  MCH 30.8 31.4 30.8  MCHC 32.8 33.8 32.5  RDW 15.9* 15.5 15.6*  PLT 202 211 224   Thyroid  No results for input(s): TSH, FREET4 in the last 168 hours.  BNPNo results for input(s): BNP, PROBNP in the last 168 hours.  DDimer No results for input(s): DDIMER in the last 168 hours.  Radiology/Studies:  ECHOCARDIOGRAM COMPLETE Result Date: 01/25/2025    ECHOCARDIOGRAM REPORT   Patient Name:   Shellie Goettl. Date of Exam: 01/25/2025 Medical Rec #:  [id number]        Height:       70.9 in Accession #:    [id number]       Weight:  175.0 lb Date of Birth:  12/30/1931        BSA:          1.990 m Patient Age:    89 years         BP:           154/80 mmHg Patient Gender: M                HR:           68 bpm. Exam Location:  Inpatient Procedure: 2D Echo, Cardiac Doppler and Color Doppler (Both Spectral and Color            Flow Doppler were utilized during procedure). Indications:    Syncope  History:        Patient has no prior history of Echocardiogram examinations.                 Risk Factors:Diabetes, Hypertension and Dyslipidemia.  Sonographer:    Odella Brewster Referring Phys: NKEIRUKA J EZENDUKA IMPRESSIONS  1. Left ventricular ejection fraction, by estimation, is 50 to 55%. The left ventricle has low normal function. The left ventricle demonstrates regional wall motion abnormalities (see scoring diagram/findings for description). Left ventricular diastolic  parameters are consistent with Grade I diastolic dysfunction (impaired relaxation). The average left ventricular global longitudinal strain is -17.7 %. The global longitudinal strain is normal.  2. Right ventricular systolic function is normal. The right ventricular size is  normal.  3. Left atrial size was mildly dilated.  4. The mitral valve is normal in structure. No evidence of mitral valve regurgitation. No evidence of mitral stenosis.  5. The aortic valve is tricuspid. Aortic valve regurgitation is not visualized. Aortic valve sclerosis/calcification is present, without any evidence of aortic stenosis.  6. The inferior vena cava is normal in size with greater than 50% respiratory variability, suggesting right atrial pressure of 3 mmHg. FINDINGS  Left Ventricle: Basal inferior and inferoseptal wall hypokinesis. Left ventricular ejection fraction, by estimation, is 50 to 55%. The left ventricle has low normal function. The left ventricle demonstrates regional wall motion abnormalities. The average left ventricular global longitudinal strain is -17.7 %. Strain was performed and the global longitudinal strain is normal. The left ventricular internal cavity size was normal in size. There is no left ventricular hypertrophy. Left ventricular diastolic parameters are consistent with Grade I diastolic dysfunction (impaired relaxation). Right Ventricle: The right ventricular size is normal. No increase in right ventricular wall thickness. Right ventricular systolic function is normal. Left Atrium: Left atrial size was mildly dilated. Right Atrium: Right atrial size was normal in size. Pericardium: There is no evidence of pericardial effusion. Mitral Valve: The mitral valve is normal in structure. No evidence of mitral valve regurgitation. No evidence of mitral valve stenosis. MV peak gradient, 7.7 mmHg. The mean mitral valve gradient is 2.0 mmHg. Tricuspid Valve: The tricuspid valve is normal in structure. Tricuspid valve regurgitation is trivial. No evidence of tricuspid stenosis. Aortic Valve: The aortic valve is tricuspid. Aortic valve regurgitation is not visualized. Aortic valve sclerosis/calcification is present, without any evidence of aortic stenosis. Aortic valve mean gradient  measures 8.0 mmHg. Aortic valve peak gradient measures 13.8 mmHg. Aortic valve area, by VTI measures 2.52 cm. Pulmonic Valve: The pulmonic valve was normal in structure. Pulmonic valve regurgitation is not visualized. No evidence of pulmonic stenosis. Aorta: The aortic root and ascending aorta are structurally normal, with no evidence of dilitation. Venous: The inferior vena cava is normal in size  with greater than 50% respiratory variability, suggesting right atrial pressure of 3 mmHg. IAS/Shunts: No atrial level shunt detected by color flow Doppler. Additional Comments: 3D was performed not requiring image post processing on an independent workstation and was abnormal.  LEFT VENTRICLE PLAX 2D LVIDd:         4.90 cm      Diastology LVIDs:         3.60 cm      LV e' medial:    4.13 cm/s LV PW:         1.10 cm      LV E/e' medial:  14.7 LV IVS:        1.10 cm      LV e' lateral:   9.36 cm/s LVOT diam:     2.40 cm      LV E/e' lateral: 6.5 LV SV:         89 LV SV Index:   45           2D Longitudinal Strain LVOT Area:     4.52 cm     2D Strain GLS Avg:     -17.7 % LV IVRT:       127 msec  LV Volumes (MOD)            3D Volume EF: LV vol d, MOD A2C: 121.0 ml 3D EF:        51 % LV vol d, MOD A4C: 143.0 ml LV EDV:       157 ml LV vol s, MOD A2C: 60.8 ml  LV ESV:       77 ml LV vol s, MOD A4C: 72.7 ml  LV SV:        80 ml LV SV MOD A2C:     60.2 ml LV SV MOD A4C:     143.0 ml LV SV MOD BP:      64.0 ml RIGHT VENTRICLE             IVC RV S prime:     13.50 cm/s  IVC diam: 1.30 cm TAPSE (M-mode): 2.2 cm                             PULMONARY VEINS                             Diastolic Velocity: 35.40 cm/s                             S/D Velocity:       1.50                             Systolic Velocity:  52.80 cm/s LEFT ATRIUM             Index        RIGHT ATRIUM           Index LA diam:        3.70 cm 1.86 cm/m   RA Area:     16.70 cm LA Vol (A2C):   86.8 ml 43.62 ml/m  RA Volume:   37.10 ml  18.64 ml/m LA Vol (A4C):    59.6 ml 29.95 ml/m LA Biplane Vol: 72.2 ml 36.28 ml/m  AORTIC VALVE  PULMONIC VALVE AV Area (Vmax):    2.29 cm      PV Vmax:       1.18 m/s AV Area (Vmean):   2.39 cm      PV Peak grad:  5.6 mmHg AV Area (VTI):     2.52 cm AV Vmax:           186.00 cm/s AV Vmean:          133.000 cm/s AV VTI:            0.354 m AV Peak Grad:      13.8 mmHg AV Mean Grad:      8.0 mmHg LVOT Vmax:         94.20 cm/s LVOT Vmean:        70.200 cm/s LVOT VTI:          0.197 m LVOT/AV VTI ratio: 0.56  AORTA Ao Root diam: 3.80 cm Ao Asc diam:  3.70 cm MITRAL VALVE MV Area (PHT): 2.16 cm     SHUNTS MV Area VTI:   2.94 cm     Systemic VTI:  0.20 m MV Peak grad:  7.7 mmHg     Systemic Diam: 2.40 cm MV Mean grad:  2.0 mmHg MV Vmax:       1.39 m/s MV Vmean:      68.1 cm/s MV Decel Time: 352 msec MV E velocity: 60.80 cm/s MV A velocity: 106.00 cm/s MV E/A ratio:  0.57 Morene Brownie Electronically signed by Morene Brownie Signature Date/Time: 01/25/2025/3:12:35 PM    Final    DG HIP UNILAT WITH PELVIS 2-3 VIEWS LEFT Result Date: 01/25/2025 CLINICAL DATA:  Postop left hip pinning. EXAM: DG HIP (WITH OR WITHOUT PELVIS) 2-3V LEFT COMPARISON:  January 25, 2025 FINDINGS: Three large radiopaque surgical screws are seen extending through the head, neck and inter trochanteric region of the proximal left femur. A fracture deformity of the left femoral neck is noted. There is no evidence of dislocation. Moderate severity degenerative changes are seen in form of joint space narrowing, acetabular sclerosis and lateral acetabular bony spurring. IMPRESSION: Status post ORIF of the proximal left femur. Electronically Signed   By: Suzen Dials M.D.   On: 01/25/2025 11:03   DG HIP UNILAT WITH PELVIS 2-3 VIEWS LEFT Result Date: 01/25/2025 CLINICAL DATA:  89 year old male undergoing percutaneous fixation of left femoral neck fracture. EXAM: DG HIP (WITH OR WITHOUT PELVIS) 2-3V LEFT COMPARISON:  01/22/2025 FINDINGS: Fluoroscopic  images demonstrate 3 inter trochanteric lag screws inserted from a lateral position with the tips terminating in the femoral head. Preserved alignment of the previously visualized subcapital femoral fracture. IMPRESSION: Intraoperative fluoroscopic guidance for left femoral neck fracture fixation. No complicating features. Electronically Signed   By: Ester Sides M.D.   On: 01/25/2025 10:01   DG C-Arm 1-60 Min-No Report Result Date: 01/25/2025 Fluoroscopy was utilized by the requesting physician.  No radiographic interpretation.     Assessment and Plan: # Near syncope: [name] reports lightheadedness upon standing but no palpitations.  He has no chest pain or palpitations.  He isn't sure how he fell and notes that he was lightheaded and also lost his balance.  Continue telemetry.  No arrhythmias noted thus far.  Will check orthostatic vital signs.  LVEF 50% with mild wall motional abnormalities.  This is not likely related.   # Wall motion abnormality:  LVEF 50-55% with hypokinesis of the basal and inferoseptal myocardium.  He is extremely active and has  no exertional symptoms.  He has no heart failure symptoms.  Recommend medical manaagemtn.  He does have mostly LAD calcification on chest CT.  Continue Xarelto , so we will not start aspirin.  Check lipids and aim for LDL <70.  Continue pravastatin .  # CAD:  # Hyperlipidemia:  Continue pravastatin .  No ischemic evaluation at this time.  Check fasting lipids.  LDL goal <70.  # Hypertension:  Continue irbesartan .  # Prior PE:  Continue Xarelto .   Risk Assessment/Risk Scores:              For questions or updates, please contact [facility] Please consult [email or web address] for contact info under      Signed, Annabella Scarce, MD  01/27/2025 4:44 PM     [1]  Allergies Allergen Reactions   Aspirin Other (See Comments)    REACTION: full strength; causes gi irritation   Nsaids     Other Reaction(s): Not available    "

## 2025-01-28 ENCOUNTER — Inpatient Hospital Stay (HOSPITAL_COMMUNITY): Admission: AD | Admit: 2025-01-28 | Source: Intra-hospital | Admitting: Physical Medicine & Rehabilitation

## 2025-01-28 ENCOUNTER — Encounter (HOSPITAL_COMMUNITY): Payer: Self-pay | Admitting: Physical Medicine & Rehabilitation

## 2025-01-28 ENCOUNTER — Inpatient Hospital Stay (HOSPITAL_COMMUNITY)
Admit: 2025-01-28 | Discharge: 2025-01-28 | Disposition: A | Attending: Physician Assistant | Admitting: Physician Assistant

## 2025-01-28 ENCOUNTER — Other Ambulatory Visit: Payer: Self-pay

## 2025-01-28 DIAGNOSIS — S72012A Unspecified intracapsular fracture of left femur, initial encounter for closed fracture: Principal | ICD-10-CM | POA: Diagnosis present

## 2025-01-28 DIAGNOSIS — S7292XA Unspecified fracture of left femur, initial encounter for closed fracture: Secondary | ICD-10-CM | POA: Insufficient documentation

## 2025-01-28 DIAGNOSIS — R55 Syncope and collapse: Secondary | ICD-10-CM

## 2025-01-28 LAB — CULTURE, BLOOD (ROUTINE X 2)
Culture: NO GROWTH
Culture: NO GROWTH

## 2025-01-28 LAB — GLUCOSE, CAPILLARY
Glucose-Capillary: 100 mg/dL — ABNORMAL HIGH (ref 70–99)
Glucose-Capillary: 152 mg/dL — ABNORMAL HIGH (ref 70–99)
Glucose-Capillary: 132 mg/dL — ABNORMAL HIGH (ref 70–99)

## 2025-01-28 LAB — MAGNESIUM: Magnesium: 1.9 mg/dL (ref 1.7–2.4)

## 2025-01-28 LAB — LIPID PANEL
Cholesterol: 155 mg/dL (ref 0–200)
HDL: 62 mg/dL
LDL Cholesterol: 80 mg/dL (ref 0–99)
Total CHOL/HDL Ratio: 2.5 ratio
Triglycerides: 65 mg/dL
VLDL: 13 mg/dL (ref 0–40)

## 2025-01-28 LAB — HEMOGLOBIN A1C
Hgb A1c MFr Bld: 5.8 % — ABNORMAL HIGH (ref 4.8–5.6)
Mean Plasma Glucose: 119.76 mg/dL

## 2025-01-28 MED ORDER — CALCIUM CARBONATE ANTACID 500 MG PO CHEW: 1.0000 | CHEWABLE_TABLET | Freq: Three times a day (TID) | ORAL | Status: DC | PRN

## 2025-01-28 MED ORDER — FLEET ENEMA RE ENEM: 1.0000 | ENEMA | Freq: Once | RECTAL | Status: AC | PRN

## 2025-01-28 MED ORDER — PROCHLORPERAZINE 25 MG RE SUPP: 12.5000 mg | Freq: Four times a day (QID) | RECTAL | Status: AC | PRN

## 2025-01-28 MED ORDER — SODIUM CHLORIDE 0.9 % IV BOLUS: 250.0000 mL | Freq: Once | INTRAVENOUS | Status: DC

## 2025-01-28 MED ORDER — METHOCARBAMOL 500 MG PO TABS: 500.0000 mg | ORAL_TABLET | Freq: Four times a day (QID) | ORAL | Status: AC | PRN

## 2025-01-28 MED ORDER — INSULIN ASPART 100 UNIT/ML IJ SOLN: 0.0000 [IU] | Freq: Every day | INTRAMUSCULAR | Status: DC

## 2025-01-28 MED ORDER — CALCIUM CARBONATE ANTACID 500 MG PO CHEW: 1.0000 | CHEWABLE_TABLET | Freq: Three times a day (TID) | ORAL | Status: AC | PRN

## 2025-01-28 MED ORDER — ONDANSETRON HCL 4 MG PO TABS: 4.0000 mg | ORAL_TABLET | Freq: Four times a day (QID) | ORAL | Status: AC | PRN

## 2025-01-28 MED ORDER — PRAVASTATIN SODIUM 40 MG PO TABS
40.0000 mg | ORAL_TABLET | Freq: Every morning | ORAL | Status: DC
  Administered 2025-01-28: 40 mg via ORAL

## 2025-01-28 MED ORDER — DOCUSATE SODIUM 100 MG PO CAPS
100.0000 mg | ORAL_CAPSULE | Freq: Two times a day (BID) | ORAL | Status: AC
  Administered 2025-01-29: 100 mg via ORAL
  Filled 2025-01-28 (×3): qty 1

## 2025-01-28 MED ORDER — DOCUSATE SODIUM 100 MG PO CAPS: 100.0000 mg | ORAL_CAPSULE | Freq: Two times a day (BID) | ORAL | Status: AC

## 2025-01-28 MED ORDER — ONDANSETRON HCL 4 MG/2ML IJ SOLN: 4.0000 mg | Freq: Four times a day (QID) | INTRAMUSCULAR | Status: AC | PRN

## 2025-01-28 MED ORDER — MENTHOL 3 MG MT LOZG: 1.0000 | LOZENGE | OROMUCOSAL | Status: AC | PRN

## 2025-01-28 MED ORDER — INSULIN ASPART 100 UNIT/ML IJ SOLN: 0.0000 [IU] | Freq: Three times a day (TID) | INTRAMUSCULAR | Status: DC

## 2025-01-28 MED ORDER — PANTOPRAZOLE SODIUM 40 MG PO TBEC
40.0000 mg | DELAYED_RELEASE_TABLET | Freq: Every day | ORAL | Status: AC
  Administered 2025-01-29: 40 mg via ORAL
  Filled 2025-01-28: qty 1

## 2025-01-28 MED ORDER — POLYETHYLENE GLYCOL 3350 17 G PO PACK
17.0000 g | PACK | Freq: Two times a day (BID) | ORAL | Status: AC
  Administered 2025-01-29: 17 g via ORAL
  Filled 2025-01-28 (×3): qty 1

## 2025-01-28 MED ORDER — AMLODIPINE BESYLATE 10 MG PO TABS
10.0000 mg | ORAL_TABLET | Freq: Every day | ORAL | Status: AC
  Administered 2025-01-29: 10 mg via ORAL
  Filled 2025-01-28: qty 1

## 2025-01-28 MED ORDER — PRAVASTATIN SODIUM 40 MG PO TABS: 40.0000 mg | ORAL_TABLET | Freq: Every morning | ORAL | Status: AC

## 2025-01-28 MED ORDER — HYDRALAZINE HCL 10 MG PO TABS: 10.0000 mg | ORAL_TABLET | ORAL | Status: AC | PRN

## 2025-01-28 MED ORDER — VITAMIN D (ERGOCALCIFEROL) 1.25 MG (50000 UNIT) PO CAPS: 50000.0000 [IU] | ORAL_CAPSULE | ORAL | Status: AC

## 2025-01-28 MED ORDER — ZINC SULFATE 220 (50 ZN) MG PO CAPS
220.0000 mg | ORAL_CAPSULE | Freq: Every day | ORAL | Status: AC
  Administered 2025-01-28 – 2025-01-29 (×2): 220 mg via ORAL
  Filled 2025-01-28 (×2): qty 1

## 2025-01-28 MED ORDER — DIPHENHYDRAMINE HCL 25 MG PO CAPS: 25.0000 mg | ORAL_CAPSULE | Freq: Four times a day (QID) | ORAL | Status: AC | PRN

## 2025-01-28 MED ORDER — PROCHLORPERAZINE MALEATE 5 MG PO TABS: 5.0000 mg | ORAL_TABLET | Freq: Four times a day (QID) | ORAL | Status: AC | PRN

## 2025-01-28 MED ORDER — IRBESARTAN 75 MG PO TABS
75.0000 mg | ORAL_TABLET | Freq: Every day | ORAL | Status: AC
  Administered 2025-01-29: 75 mg via ORAL
  Filled 2025-01-28: qty 1

## 2025-01-28 MED ORDER — HYDROCODONE-ACETAMINOPHEN 5-325 MG PO TABS
1.0000 | ORAL_TABLET | Freq: Four times a day (QID) | ORAL | Status: AC | PRN
  Administered 2025-01-29: 1 via ORAL
  Filled 2025-01-28: qty 1

## 2025-01-28 MED ORDER — RIVAROXABAN 20 MG PO TABS
20.0000 mg | ORAL_TABLET | Freq: Every day | ORAL | Status: AC
  Administered 2025-01-29: 20 mg via ORAL
  Filled 2025-01-28: qty 1

## 2025-01-28 MED ORDER — TRAZODONE HCL 50 MG PO TABS: 25.0000 mg | ORAL_TABLET | Freq: Every evening | ORAL | Status: AC | PRN

## 2025-01-28 MED ORDER — PRAVASTATIN SODIUM 40 MG PO TABS
40.0000 mg | ORAL_TABLET | Freq: Every morning | ORAL | Status: AC
  Administered 2025-01-29: 40 mg via ORAL
  Filled 2025-01-28: qty 1

## 2025-01-28 MED ORDER — METHOCARBAMOL 1000 MG/10ML IJ SOLN: 500.0000 mg | Freq: Four times a day (QID) | INTRAMUSCULAR | Status: AC | PRN

## 2025-01-28 MED ORDER — ACETAMINOPHEN 325 MG PO TABS: 325.0000 mg | ORAL_TABLET | ORAL | Status: AC | PRN

## 2025-01-28 MED ORDER — METRONIDAZOLE 500 MG/100ML IV SOLN: 500.0000 mg | Freq: Two times a day (BID) | INTRAVENOUS | Status: DC

## 2025-01-28 MED ORDER — SODIUM CHLORIDE 0.9 % IV SOLN: 2.0000 g | INTRAVENOUS | Status: DC

## 2025-01-28 MED ORDER — HYDROCODONE-ACETAMINOPHEN 5-325 MG PO TABS: 1.0000 | ORAL_TABLET | Freq: Four times a day (QID) | ORAL | Status: AC | PRN

## 2025-01-28 MED ORDER — INSULIN ASPART 100 UNIT/ML IJ SOLN
0.0000 [IU] | Freq: Three times a day (TID) | INTRAMUSCULAR | Status: AC
  Administered 2025-01-28 – 2025-01-29 (×2): 3 [IU] via SUBCUTANEOUS
  Filled 2025-01-28: qty 2
  Filled 2025-01-28: qty 3

## 2025-01-28 MED ORDER — GUAIFENESIN-DM 100-10 MG/5ML PO SYRP: 5.0000 mL | ORAL_SOLUTION | Freq: Four times a day (QID) | ORAL | Status: AC | PRN

## 2025-01-28 MED ORDER — BISACODYL 10 MG RE SUPP: 10.0000 mg | Freq: Every day | RECTAL | Status: AC | PRN

## 2025-01-28 MED ORDER — PROCHLORPERAZINE EDISYLATE 10 MG/2ML IJ SOLN: 5.0000 mg | Freq: Four times a day (QID) | INTRAMUSCULAR | Status: AC | PRN

## 2025-01-28 MED ORDER — PHENOL 1.4 % MT LIQD: 1.0000 | OROMUCOSAL | Status: AC | PRN

## 2025-01-28 NOTE — Progress Notes (Signed)
" °   01/28/25 1331  Vitals  Temp 97.8 F (36.6 C)  Temp Source Oral  BP 119/65  MAP (mmHg) 78  BP Location Right Arm  BP Method Automatic  Patient Position (if appropriate) Lying  Pulse Rate 65  Pulse Rate Source Monitor  ECG Heart Rate 64  Resp 16  MEWS COLOR  MEWS Score Color Green  Orthostatic Lying   BP- Lying 119/65  Pulse- Lying 78  Orthostatic Sitting  BP- Sitting 106/68  Pulse- Sitting 74  Orthostatic Standing at 0 minutes  BP- Standing at 0 minutes (!) 86/39  Pulse- Standing at 0 minutes 65  Oxygen Therapy  SpO2 98 %  O2 Device Room Air  MEWS Score  MEWS Temp 0  MEWS Systolic 0  MEWS Pulse 0  MEWS RR 0  MEWS LOC 0  MEWS Score 0    "

## 2025-01-28 NOTE — Progress Notes (Signed)
 PMR Admission Coordinator Pre-Admission Assessment   Patient: [name]. is an 89 y.o., male MRN: [id number] DOB: 1932/07/19 Height: 5' 11 (180.3 cm) Weight: 79.4 kg   Insurance Information HMO:     PPO:      PCP:      IPA:      80/20: yes     OTHER:  PRIMARY: Medicare Part A and B          Policy#: [id number] subscriber: pt Benefits:  Phone #: passport one source     Name:  Eff. Date: 04/24/1999     Deduct: $1676      Out of Pocket Max: none      Life Max: none CIR: 100%      SNF: 20 full days Outpatient: 80%     Co-Pay: 20% Home Health: 100%      Co-Pay:  DME: 80%     Co-Pay: 20% Providers: pt choice   SECONDARY: A[employer]      Policy#: [id number]      Phone#:    Financial Counselor:       Phone#:    The Data Collection Information Summary for patients in Inpatient Rehabilitation Facilities with attached Privacy Act Statement-Health Care Records was provided and verbally reviewed with:    Emergency Contact Information Contact Information       Name Relation Home Work Mobile    [place] Spouse [phone]   [phone]    [name], [name]     [phone]         Other Contacts       Name Relation Home Work Mobile    Gooch,Bettie Daughter     [phone]           Current Medical History  Patient Admitting Diagnosis: L Femur fx   History of Present Illness: [name]. is a 89 y.o. male with medical history significant of hypertension, hyperlipidemia, ocular myasthenia gravis, history of PE on Xarelto , diabetes mellitus type 2, chronic kidney disease stage IIIb, prostate cancer, BPH, and GERD presented to [facility] 01/23/25 with a fall resulting in left hip pain and blackout. He is accompanied by his daughter-in-law.  He has a history of prostate cancer, for which he underwent forty treatments, and kidney stones. He is currently on Xarelto  for anticoagulation due to a past pulmonary embolism and takes a new blood pressure medication  as needed.   In the emergency department patient was noted to be febrile up to 101.2 F with respirations 13-23, blood pressures elevated up to 168/92, and O2 saturations noted to be as low as 91% which he was placed on 2 L nasal cannula oxygen.  Labs noted  WBC 4.7, hemoglobin 10.4, potassium 5.2, BUN 26, creatinine 1.66, high-sensitivity troponin 48, and lactic acid 1.3.  CT scan of the head did not reveal any acute abnormality.  CT scan of the hip revealed acute transverse oblique subcapital proximal left femoral fracture with slight impaction, left inguinal hernia containing nonobstructed loop of small bowel, and new fiduciary markers in the prostate.  CT angiogram of the chest, abdomen, pelvis was obtained noting no pulmonary embolism, mild bladder wall thickening versus underdistention, thickened wall versus underdistention of the ascending and proximal transverse colon possibly reflecting colitis, and bronchitis with scattered subsegmental lower lobe bronchial impactions without infiltrates.  Urinalysis showed no significant signs for infection.  Influenza, COVID-19, and RSV screening were negative.  Blood cultures were obtained.  Patient had been given 1 L normal saline  IV fluids, Zofran , fentanyl  50 mcg IV, and oxycodone  5 mg.  Pt found to have closed subcapital fracture of left femur now s/p cannulated screw fixation of left femoral neck fracture 01/25/2025. Pt. Seen by PT/OT and they recommend CIR to assist return to PLOF.   Patient's medical record from [facility] has been reviewed by the rehabilitation admission coordinator and physician.   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 Yes   Family History   family history includes Heart attack in his father; Sarcoidosis in his brother.   Current Medications [Current Medications]  [Current Medications]   Current Facility-Administered Medications:    amLODipine  (NORVASC ) tablet 10 mg, 10 mg, Oral, Daily, Ezenduka, Nkeiruka J, MD, 10 mg at 01/27/25 9043   cefTRIAXone  (ROCEPHIN ) 2 g in sodium chloride  0.9 % 100 mL IVPB, 2 g, Intravenous, Q24H, Deward Eck, PA-C, Last Rate: 200 mL/hr at 01/27/25 0742, 2 g at 01/27/25 9257   docusate sodium  (COLACE) capsule 100 mg, 100 mg, Oral, BID, Deward Eck, PA-C, 100 mg at 01/27/25 9043   hydrALAZINE  (APRESOLINE ) tablet 10 mg, 10 mg, Oral, PRN, Deward Eck, PA-C, 10 mg at 01/25/25 0600   HYDROcodone -acetaminophen  (NORCO/VICODIN) 5-325 MG per tablet 1-2 tablet, 1-2 tablet, Oral, Q6H PRN, Deward Eck, PA-C, 1 tablet at 01/28/25 9352   irbesartan  (AVAPRO ) tablet 75 mg, 75 mg, Oral, Daily, Deward Eck, PA-C, 75 mg at 01/27/25 9043   menthol  (CEPACOL) lozenge 3 mg, 1 lozenge, Oral, PRN **OR** phenol (CHLORASEPTIC) mouth spray 1 spray, 1 spray, Mouth/Throat, PRN, Deward Eck, PA-C   methocarbamol  (ROBAXIN ) tablet 500 mg, 500 mg, Oral, Q6H PRN, 500 mg at 01/26/25 0840 **OR** methocarbamol  (ROBAXIN ) injection 500 mg, 500 mg, Intravenous, Q6H PRN, Deward Eck, PA-C   metoCLOPramide  (REGLAN ) tablet 5-10 mg, 5-10 mg, Oral, Q8H PRN **OR** metoCLOPramide  (REGLAN ) injection 5-10 mg, 5-10 mg, Intravenous, Q8H PRN, Deward Eck, PA-C   metroNIDAZOLE  (FLAGYL ) IVPB 500 mg, 500 mg, Intravenous, Q12H, Deward Eck, PA-C, Last Rate: 100 mL/hr at 01/28/25 0647, 500 mg at 01/28/25 0647   morphine  (PF)  2 MG/ML injection 0.5 mg, 0.5 mg, Intravenous, Q2H PRN, Deward Eck, PA-C   ondansetron  (ZOFRAN ) tablet 4 mg, 4 mg, Oral, Q6H PRN **OR** ondansetron  (ZOFRAN ) injection 4 mg, 4 mg, Intravenous, Q6H PRN, Deward Eck, PA-C, 4 mg at 01/26/25 1736   pantoprazole  (PROTONIX ) EC tablet 40 mg, 40 mg, Oral, Daily, Ezenduka, Nkeiruka J, MD, 40 mg at 01/27/25 0956   polyethylene glycol (MIRALAX  / GLYCOLAX ) packet 17 g, 17 g, Oral, BID, Ezenduka, Nkeiruka J, MD, 17 g at 01/27/25 0955   pravastatin  (PRAVACHOL ) tablet 40 mg,  40 mg, Oral, q morning, Dunn, Dayna N, PA-C   rivaroxaban  (XARELTO ) tablet 20 mg, 20 mg, Oral, Q breakfast, Ezenduka, Nkeiruka J, MD, 20 mg at 01/27/25 0730   Vitamin D  (Ergocalciferol ) (DRISDOL ) 1.25 MG (50000 UNIT) capsule 50,000 Units, 50,000 Units, Oral, Q7 days, Deward Eck, PA-C, 50,000 Units at 01/27/25 9044   Patients Current Diet:  Diet Order                  Diet Carb Modified Room service appropriate? Yes  Diet effective now                         Precautions / Restrictions Precautions Precautions: Fall Precaution/Restrictions Comments: No ROM restrictions L hip/knee Restrictions Weight Bearing Restrictions Per Provider Order: Yes LLE Weight Bearing Per Provider Order: Partial weight bearing LLE Partial Weight Bearing Percentage or Pounds: 50 Other Position/Activity Restrictions: Discussed WB restrictions with Eck Deward 2/3    Has the patient had 2 or more falls or a fall with injury in the past year? Yes   Prior Activity Level Community (5-7x/wk): Pt. active in the community PTA   Prior Functional Level Self Care: Did the patient need help bathing, dressing, using the toilet or eating? Independent   Indoor Mobility: Did the patient need assistance with walking from room to room (with or without device)? Independent   Stairs: Did the patient need assistance with internal or external stairs (with or without device)? Independent   Functional Cognition: Did  the patient need help planning regular tasks such as shopping or remembering to take medications? Independent   Patient Information Are you of Hispanic, Latino/a,or Spanish origin?: A. No, not of Hispanic, Latino/a, or Spanish origin What is your race?: B. Black or African American Do you need or want an interpreter to communicate with a doctor or health care staff?: 0. No   Patient's Response To:  Health Literacy and Transportation Is the patient able to respond to health literacy and transportation needs?: Yes Health Literacy - How often do you need to have someone help you when you read instructions, pamphlets, or other written material from your doctor or pharmacy?: Never In the past 12 months, has lack of transportation kept you from medical appointments or from getting medications?: No In the past 12 months, has lack of transportation kept you from meetings, work, or from getting things needed for daily living?: No   Journalist, Newspaper / Equipment Home Equipment: Grab bars - tub/shower, Shower seat - built in, BSC/3in1   Prior Device Use: Indicate devices/aids used by the patient prior to current illness, exacerbation or injury? None of the above   Current Functional Level Cognition   Orientation Level: Oriented X4    Extremity Assessment (includes Sensation/Coordination)   Upper Extremity Assessment: Overall WFL for tasks assessed  Lower Extremity Assessment: Defer to PT evaluation LLE Deficits / Details: L fem neck fx s/p screw fixation. Able to perform heel slide, ankle DF WFL     ADLs   Grooming: Wash/dry hands, Wash/dry face, Set up, Sitting Lower Body Dressing: Moderate assistance, Sit to/from stand Toilet Transfer: Minimal assistance, Rolling walker (2 wheels), Regular Toilet, Ambulation     Mobility   Overal bed mobility: Needs Assistance Bed Mobility: Supine to Sit Supine to sit: Supervision Sit to supine: Min assist General bed mobility comments: HOB  elevated 16 degrees, increased time     Transfers   Overall transfer level: Needs assistance Equipment used:  Rolling walker (2 wheels) Transfers: Sit to/from Stand Sit to Stand: Contact guard assist General transfer comment: Verbal cues for bringing LLE anteriorly prior to transition to standing or sitting     Ambulation / Gait / Stairs / Wheelchair Mobility   Ambulation/Gait Ambulation/Gait assistance: Contact guard assist Gait Distance (Feet): 90 Feet Assistive device: Rolling walker (2 wheels) Gait Pattern/deviations: Step-to pattern General Gait Details: Slowed step to pattern. Verbal cues for walker use, weightbearing precautions, upward gaze Gait velocity: decreased Gait velocity interpretation: <1.31 ft/sec, indicative of household ambulator     Posture / Balance Balance Overall balance assessment: Needs assistance Sitting-balance support: Feet supported Sitting balance-Leahy Scale: Fair Standing balance support: Bilateral upper extremity supported Standing balance-Leahy Scale: Poor     Special considerations/life events  Skin post op left hip surgical wound with dressing    Previous Home Environment (from acute therapy documentation) Living Arrangements: Spouse/significant other  Lives With: Spouse Available Help at Discharge: Family Type of Home: House Home Layout: One level Home Access: Stairs to enter Entrance Stairs-Rails: Right Entrance Stairs-Number of Steps: 3 Bathroom Shower/Tub: Health Visitor: Standard Bathroom Accessibility: Yes How Accessible: Accessible via walker Home Care Services: No   Discharge Living Setting Plans for Discharge Living Setting: Patient's home Type of Home at Discharge: House Discharge Home Layout: One level Discharge Home Access: Stairs to enter Entrance Stairs-Rails: Right Entrance Stairs-Number of Steps: 3 Discharge Bathroom Shower/Tub: Walk-in shower Discharge Bathroom Toilet: Standard Discharge Bathroom  Accessibility: Yes Does the patient have any problems obtaining your medications?: Yes (Describe) (Xarelto  is expensive)   Social/Family/Support Systems Patient Roles: Spouse Contact Information: [phone] Anticipated Caregiver: Thelma Anticipated Industrial/product Designer Information: Wife 24/7 supervision, son to stay for a while after dc to assist Caregiver Availability: 24/7 Discharge Plan Discussed with Primary Caregiver: Yes Is Caregiver In Agreement with Plan?: Yes Does Caregiver/Family have Issues with Lodging/Transportation while Pt is in Rehab?: Yes   Goals Patient/Family Goal for Rehab: PT/OT Supervision level Expected length of stay: 5-7 days Pt/Family Agrees to Admission and willing to participate: Yes Program Orientation Provided & Reviewed with Pt/Caregiver Including Roles  & Responsibilities: Yes   Decrease burden of Care through IP rehab admission: not anticipated   Possible need for SNF placement upon discharge: not anticipated   Patient Condition: I have reviewed medical records from [facility], spoken with CM, and patient and spouse. I met with patient at the bedside for inpatient rehabilitation assessment.  Patient will benefit from ongoing PT and OT, can actively participate in 3 hours of therapy a day 5 days of the week, and can make measurable gains during the admission.  Patient will also benefit from the coordinated team approach during an Inpatient Acute Rehabilitation admission.  The patient will receive intensive therapy as well as Rehabilitation physician, nursing, social worker, and care management interventions.  Due to safety, skin/wound care, disease management, medication administration, pain management, and patient education the patient requires 24 hour a day rehabilitation nursing.  The patient is currently CGA to min assist with mobility and basic ADLs.  Discharge setting and therapy post discharge at home with home health is anticipated.   Patient has agreed to participate in the Acute Inpatient Rehabilitation Program and will admit today.   Preadmission Screen Completed By:  Lovett CHRISTELLA Ropes, 01/28/2025 10:49 AM ______________________________________________________________________   Discussed status with Dr. Babs on 01/28/25 at 0930 and received approval for admission today.   Admission Coordinator:  Lovett CHRISTELLA Ropes, RN, time 1105/Date  01/28/25    Assessment/Plan: Diagnosis: L subcapital fracture of L femur Does the need for close, 24 hr/day Medical supervision in concert with the patient's rehab needs make it unreasonable for this patient to be served in a less intensive setting? Yes Co-Morbidities requiring supervision/potential complications: DM A1c 6.1, HTN, HLD; ocular Myasthenia gravis; prostate CA; syncope vs presyncope, CKD3B, hx of PE on Xarelto  Due to bladder management, bowel management, safety, skin/wound care, disease management, medication administration, pain management, and patient education, does the patient require 24 hr/day rehab nursing? Yes Does the patient require coordinated care of a physician, rehab nurse, PT, OT,  to address physical and functional deficits in the context of the above medical diagnosis(es)? Yes Addressing deficits in the following areas: balance, endurance, locomotion, strength, transferring, bowel/bladder control, bathing, dressing, feeding, grooming, and toileting Can the patient actively participate in an intensive therapy program of at least 3 hrs of therapy 5 days a week? Yes The potential for patient to make measurable gains while on inpatient rehab is good Anticipated functional outcomes upon discharge from inpatient rehab: supervision PT, supervision OT, n/a SLP Estimated rehab length of stay to reach the above functional goals is: 5-7 days- PWB 50% on LLE Anticipated discharge destination: Home 10. Overall Rehab/Functional Prognosis: good     MD Signature:              Revision History  Date/Time User Provider Type Action  01/28/2025 12:19 PM Cornelio Bouchard, MD Physician Sign  01/28/2025 11:05 AM Duanne Lovett HERO, RN Rehab Admission Coordinator Share  01/28/2025 11:02 AM Duanne Lovett HERO, RN Rehab Admission Coordinator Share  01/28/2025 10:49 AM Duanne Lovett HERO, RN Rehab Admission Coordinator Share  01/27/2025  3:27 PM Cecilie, Leita KATHEE OVERMAN Rehab Admission Coordinator Share   View Details Report

## 2025-01-28 NOTE — Discharge Summary (Signed)
 " Physician Discharge Summary   Patient: [name]. MRN: [id number] DOB: 10/15/1932  Admit date:     01/22/2025  Discharge date: 01/28/25  Discharge Physician: Garnette Pelt   PCP: Shepard Ade, MD   Recommendations at discharge:    Follow up with PCP in 1-2 weeks when discharged Follow up with Cardiology as scheduled  Discharge Diagnoses: Principal Problem:   Closed subcapital fracture of left femur [facility]) Active Problems:   Fall at home, initial encounter   Fever   Near syncope   Elevated troponin   Hyperkalemia   Type 2 diabetes mellitus without complication, without long-term current use of insulin  (HCC)   History of pulmonary embolism   History of myasthenia gravis   Malignant neoplasm of prostate (HCC)   Pure hypercholesterolemia   CAD in native artery  Resolved Problems:   * No resolved hospital problems. *  Hospital Course: 89 y.o. male with medical history significant of HTN, HLD, ocular myasthenia gravis, history of PE on Xarelto , DM 2, CKD stage IIIb, prostate cancer, BPH, and GERD presents with a fall resulting in left hip pain and blackout/presyncope. Pt experienced a fall after standing up, had a presyncopal episode, landing on his left side, which resulted in significant L hip pain. He was also noted to have an episode of vomiting after the fall. In the ED, patient was noted to be febrile up to 101.2 F with respirations 13-23, blood pressures elevated up to 168/92. Labs noted WBC 4.7, hemoglobin 10.4, potassium 5.2, BUN 26, creatinine 1.66, high-sensitivity troponin 48, and lactic acid 1.3.  CT scan of the head did not reveal any acute abnormality.  CT scan of the hip revealed acute transverse oblique subcapital proximal left femoral fracture with slight impaction. CT angiogram of the chest, abdomen, pelvis was obtained noting no pulmonary embolism, mild bladder wall thickening versus underdistention, thickened wall versus underdistention of the ascending and  proximal transverse colon possibly reflecting colitis, and bronchitis with scattered subsegmental lower lobe bronchial impactions without infiltrates.  Urinalysis showed no significant signs for infection.  Influenza, COVID-19, and RSV screening were negative.  Blood cultures were obtained. Dr. Kendal of orthopedics was consulted.  Patient admitted for further management.   Assessment and Plan: Left subcapital femur fracture secondary to fall CT of the hip revealed acute transverse oblique subcapital proximal left femoral fracture with slight impaction. Orthopedics consulted, s/p cannulated screw fixation of left femoral neck fracture on 01/25/2025 Continue pain management, DVT ppx as per orthopedics PT/OT- CIR   Fever  Likely 2/2 colitis vs Aspiration pneumonia (vomited after presyncopal event) Last temp spike 101.2 on 01/23/2025, with no leukocytosis Influenza, COVID-19, RSV, respiratory viral panel screening were negative UA negative for infection Procalcitonin 0.65 BC x 2 NGTD CT imaging noted thickened wall versus underdistention of the ascending and proximal transverse colon possibly reflecting colitis, and bronchitis with scattered subsegmental lower lobe bronchial impactions without infiltrates Completed 5 days of rocephin  and flagyl  Clinically improved   Presyncope with wall motion abnormality He reportedly had stood up after eating, and had a presyncopal episode  Orthostatic hypotension negative Reports remote hx of vertigo CT head unremarkable Echo showed EF of 50 to 55%, noted regional wall motion abnormality, grade 1 diastolic dysfunction, no previous to compare RWMA noted on echo,  -Cardiology was consulted. Pravastatin  increased to 40mg  -Cardiology recommended cardiac monitoring, with 14 day Zio planned while in CIR then cardiac event monitor at time of d/c home   Elevated troponin Chest pain-free High-sensitivity  troponin elevated at 48 Echo noted as above   HTN BP  was uncontrolled Likely worsened due to pain Continue amlodipine , Avapro , hydralazine  as needed BP at time of d/c better controlled   Diabetes mellitus type 2 Last available hemoglobin A1c 6.1. Continue to monitor   Chronic kidney disease stage IIIb Baseline creatinine appears to range from 1.4-1.8   History of pulmonary embolism Patient is on Xarelto    History of ocular myasthenia gravis Outpatient follow-up   Vit D def Vit d level 7.9 Started Vit D supplement   Prostate cancer Outpatient follow-up    Consultants: Orthopedic Surgery, Cardiology Procedures performed: PERCUTANEOUS FIXATION OF LEFT FEMORAL NECK FRACTURE   Disposition: Rehabilitation facility Diet recommendation:  Carb modified diet DISCHARGE MEDICATION: Allergies as of 01/28/2025       Reactions   Aspirin Other (See Comments)   REACTION: full strength; causes gi irritation   Nsaids    Other Reaction(s): Not available        Medication List     PAUSE taking these medications    metFORMIN 500 MG 24 hr tablet Wait to take this until your doctor or other care provider tells you to start again. Commonly known as: GLUCOPHAGE-XR Take 500 mg by mouth in the morning and at bedtime.       STOP taking these medications    FERROUS SULFATE PO   Orgovyx 120 MG tablet Generic drug: relugolix   potassium chloride 10 MEQ tablet Commonly known as: KLOR-CON       TAKE these medications    albuterol  108 (90 Base) MCG/ACT inhaler Commonly known as: VENTOLIN  HFA Inhale 2 puffs into the lungs every 6 (six) hours as needed for wheezing.   amLODipine  10 MG tablet Commonly known as: NORVASC  Take 1 tablet (10 mg total) by mouth daily.   calcium  carbonate 500 MG chewable tablet Commonly known as: TUMS - dosed in mg elemental calcium  Chew 1 tablet (200 mg of elemental calcium  total) by mouth 3 (three) times daily as needed for indigestion or heartburn.   docusate sodium  100 MG capsule Commonly known  as: COLACE Take 1 capsule (100 mg total) by mouth 2 (two) times daily.   hydrALAZINE  10 MG tablet Commonly known as: APRESOLINE  Take 10 mg by mouth as needed (only take as needed if bp is 170/100.).   HYDROcodone -acetaminophen  5-325 MG tablet Commonly known as: NORCO/VICODIN Take 1-2 tablets by mouth every 6 (six) hours as needed for moderate pain (pain score 4-6).   methocarbamol  500 MG tablet Commonly known as: ROBAXIN  Take 1 tablet (500 mg total) by mouth every 6 (six) hours as needed for muscle spasms.   pantoprazole  40 MG tablet Commonly known as: PROTONIX  Take 1 tablet (40 mg total) by mouth 2 (two) times daily before a meal. What changed: when to take this   polyethylene glycol 17 g packet Commonly known as: MIRALAX  / GLYCOLAX  Take 17 g by mouth 2 (two) times daily.   pravastatin  40 MG tablet Commonly known as: PRAVACHOL  Take 1 tablet (40 mg total) by mouth every morning. What changed:  medication strength how much to take   telmisartan 20 MG tablet Commonly known as: MICARDIS Take 20 mg by mouth at bedtime.   Trelegy Ellipta 100-62.5-25 MCG/INH Aepb Generic drug: Fluticasone -Umeclidin-Vilant Inhale 1 puff into the lungs daily as needed (shortness of breath/wheezing).   VITAMIN C PO Take 1 tablet by mouth See admin instructions. Take one tablet by mouth with iron (ferrous sulfate) - occasionally  Vitamin D  (Ergocalciferol ) 1.25 MG (50000 UNIT) Caps capsule Commonly known as: DRISDOL  Take 1 capsule (50,000 Units total) by mouth every 7 (seven) days. Start taking on: February 03, 2025   Xarelto  20 MG Tabs tablet Generic drug: rivaroxaban  Take 20 mg by mouth every morning.        Follow-up Information     Celena Sharper, MD Follow up in 2 week(s).   Specialty: Orthopedic Surgery Contact information: [address] [place] [address] [phone]         Vannie Reche RAMAN, NP Follow up.   Specialty: Cardiology Why: Davene Nicolas at  [facility] at [facility] - cardiology follow-up arranged on Friday Feb 26, 2025 10:55 AM. Lucrezia 15 minutes prior to appointment to check in. Contact information: [address] [place] [address] [phone]         Shepard Ade, MD Follow up in 2 week(s).   Specialty: Internal Medicine Why: Hospital follow up Contact information: [address] [place] [address] [phone]                Discharge Exam: Fredricka Weights   01/22/25 2035 01/25/25 0648  Weight: 79.4 kg 79.4 kg   General exam: Awake, laying in bed, in nad Respiratory system: Normal respiratory effort, no wheezing Cardiovascular system: regular rate, s1, s2 Gastrointestinal system: Soft, nondistended, positive BS Central nervous system: CN2-12 grossly intact, strength intact Extremities: Perfused, no clubbing Skin: Normal skin turgor, no notable skin lesions seen Psychiatry: Mood normal // affect normal  Condition at discharge: fair  The results of significant diagnostics from this hospitalization (including imaging, microbiology, ancillary and laboratory) are listed below for reference.   Imaging Studies: ECHOCARDIOGRAM COMPLETE Result Date: 01/25/2025    ECHOCARDIOGRAM REPORT   Patient Name:   Zaeem Kandel. Date of Exam: 01/25/2025 Medical Rec #:  [id number]        Height:       70.9 in Accession #:    [id number]       Weight:       175.0 lb Date of Birth:  January 24, 1932        BSA:          1.990 m Patient Age:    89 years         BP:           154/80 mmHg Patient Gender: M                HR:           68 bpm. Exam Location:  Inpatient Procedure: 2D Echo, Cardiac Doppler and Color Doppler (Both Spectral and Color            Flow Doppler were utilized during procedure). Indications:    Syncope  History:        Patient has no prior history of Echocardiogram examinations.                 Risk Factors:Diabetes, Hypertension and Dyslipidemia.  Sonographer:    Odella Brewster Referring  Phys: NKEIRUKA J EZENDUKA IMPRESSIONS  1. Left ventricular ejection fraction, by estimation, is 50 to 55%. The left ventricle has low normal function. The left ventricle demonstrates regional wall motion abnormalities (see scoring diagram/findings for description). Left ventricular diastolic  parameters are consistent with Grade I diastolic dysfunction (impaired relaxation). The average left ventricular global longitudinal strain is -17.7 %. The global longitudinal strain is normal.  2. Right ventricular systolic function is normal. The  right ventricular size is normal.  3. Left atrial size was mildly dilated.  4. The mitral valve is normal in structure. No evidence of mitral valve regurgitation. No evidence of mitral stenosis.  5. The aortic valve is tricuspid. Aortic valve regurgitation is not visualized. Aortic valve sclerosis/calcification is present, without any evidence of aortic stenosis.  6. The inferior vena cava is normal in size with greater than 50% respiratory variability, suggesting right atrial pressure of 3 mmHg. FINDINGS  Left Ventricle: Basal inferior and inferoseptal wall hypokinesis. Left ventricular ejection fraction, by estimation, is 50 to 55%. The left ventricle has low normal function. The left ventricle demonstrates regional wall motion abnormalities. The average left ventricular global longitudinal strain is -17.7 %. Strain was performed and the global longitudinal strain is normal. The left ventricular internal cavity size was normal in size. There is no left ventricular hypertrophy. Left ventricular diastolic parameters are consistent with Grade I diastolic dysfunction (impaired relaxation). Right Ventricle: The right ventricular size is normal. No increase in right ventricular wall thickness. Right ventricular systolic function is normal. Left Atrium: Left atrial size was mildly dilated. Right Atrium: Right atrial size was normal in size. Pericardium: There is no evidence of pericardial  effusion. Mitral Valve: The mitral valve is normal in structure. No evidence of mitral valve regurgitation. No evidence of mitral valve stenosis. MV peak gradient, 7.7 mmHg. The mean mitral valve gradient is 2.0 mmHg. Tricuspid Valve: The tricuspid valve is normal in structure. Tricuspid valve regurgitation is trivial. No evidence of tricuspid stenosis. Aortic Valve: The aortic valve is tricuspid. Aortic valve regurgitation is not visualized. Aortic valve sclerosis/calcification is present, without any evidence of aortic stenosis. Aortic valve mean gradient measures 8.0 mmHg. Aortic valve peak gradient measures 13.8 mmHg. Aortic valve area, by VTI measures 2.52 cm. Pulmonic Valve: The pulmonic valve was normal in structure. Pulmonic valve regurgitation is not visualized. No evidence of pulmonic stenosis. Aorta: The aortic root and ascending aorta are structurally normal, with no evidence of dilitation. Venous: The inferior vena cava is normal in size with greater than 50% respiratory variability, suggesting right atrial pressure of 3 mmHg. IAS/Shunts: No atrial level shunt detected by color flow Doppler. Additional Comments: 3D was performed not requiring image post processing on an independent workstation and was abnormal.  LEFT VENTRICLE PLAX 2D LVIDd:         4.90 cm      Diastology LVIDs:         3.60 cm      LV e' medial:    4.13 cm/s LV PW:         1.10 cm      LV E/e' medial:  14.7 LV IVS:        1.10 cm      LV e' lateral:   9.36 cm/s LVOT diam:     2.40 cm      LV E/e' lateral: 6.5 LV SV:         89 LV SV Index:   45           2D Longitudinal Strain LVOT Area:     4.52 cm     2D Strain GLS Avg:     -17.7 % LV IVRT:       127 msec  LV Volumes (MOD)            3D Volume EF: LV vol d, MOD A2C: 121.0 ml 3D EF:        51 %  LV vol d, MOD A4C: 143.0 ml LV EDV:       157 ml LV vol s, MOD A2C: 60.8 ml  LV ESV:       77 ml LV vol s, MOD A4C: 72.7 ml  LV SV:        80 ml LV SV MOD A2C:     60.2 ml LV SV MOD A4C:      143.0 ml LV SV MOD BP:      64.0 ml RIGHT VENTRICLE             IVC RV S prime:     13.50 cm/s  IVC diam: 1.30 cm TAPSE (M-mode): 2.2 cm                             PULMONARY VEINS                             Diastolic Velocity: 35.40 cm/s                             S/D Velocity:       1.50                             Systolic Velocity:  52.80 cm/s LEFT ATRIUM             Index        RIGHT ATRIUM           Index LA diam:        3.70 cm 1.86 cm/m   RA Area:     16.70 cm LA Vol (A2C):   86.8 ml 43.62 ml/m  RA Volume:   37.10 ml  18.64 ml/m LA Vol (A4C):   59.6 ml 29.95 ml/m LA Biplane Vol: 72.2 ml 36.28 ml/m  AORTIC VALVE                     PULMONIC VALVE AV Area (Vmax):    2.29 cm      PV Vmax:       1.18 m/s AV Area (Vmean):   2.39 cm      PV Peak grad:  5.6 mmHg AV Area (VTI):     2.52 cm AV Vmax:           186.00 cm/s AV Vmean:          133.000 cm/s AV VTI:            0.354 m AV Peak Grad:      13.8 mmHg AV Mean Grad:      8.0 mmHg LVOT Vmax:         94.20 cm/s LVOT Vmean:        70.200 cm/s LVOT VTI:          0.197 m LVOT/AV VTI ratio: 0.56  AORTA Ao Root diam: 3.80 cm Ao Asc diam:  3.70 cm MITRAL VALVE MV Area (PHT): 2.16 cm     SHUNTS MV Area VTI:   2.94 cm     Systemic VTI:  0.20 m MV Peak grad:  7.7 mmHg     Systemic Diam: 2.40 cm MV Mean grad:  2.0 mmHg MV Vmax:       1.39 m/s MV Vmean:      68.1  cm/s MV Decel Time: 352 msec MV E velocity: 60.80 cm/s MV A velocity: 106.00 cm/s MV E/A ratio:  0.57 Morene Brownie Electronically signed by Morene Brownie Signature Date/Time: 01/25/2025/3:12:35 PM    Final    DG HIP UNILAT WITH PELVIS 2-3 VIEWS LEFT Result Date: 01/25/2025 CLINICAL DATA:  Postop left hip pinning. EXAM: DG HIP (WITH OR WITHOUT PELVIS) 2-3V LEFT COMPARISON:  January 25, 2025 FINDINGS: Three large radiopaque surgical screws are seen extending through the head, neck and inter trochanteric region of the proximal left femur. A fracture deformity of the left femoral neck is noted. There  is no evidence of dislocation. Moderate severity degenerative changes are seen in form of joint space narrowing, acetabular sclerosis and lateral acetabular bony spurring. IMPRESSION: Status post ORIF of the proximal left femur. Electronically Signed   By: Suzen Dials M.D.   On: 01/25/2025 11:03   DG HIP UNILAT WITH PELVIS 2-3 VIEWS LEFT Result Date: 01/25/2025 CLINICAL DATA:  89 year old male undergoing percutaneous fixation of left femoral neck fracture. EXAM: DG HIP (WITH OR WITHOUT PELVIS) 2-3V LEFT COMPARISON:  01/22/2025 FINDINGS: Fluoroscopic images demonstrate 3 inter trochanteric lag screws inserted from a lateral position with the tips terminating in the femoral head. Preserved alignment of the previously visualized subcapital femoral fracture. IMPRESSION: Intraoperative fluoroscopic guidance for left femoral neck fracture fixation. No complicating features. Electronically Signed   By: Ester Sides M.D.   On: 01/25/2025 10:01   DG C-Arm 1-60 Min-No Report Result Date: 01/25/2025 Fluoroscopy was utilized by the requesting physician.  No radiographic interpretation.   CT Angio Chest PE W and/or Wo Contrast Result Date: 01/23/2025 EXAM: CTA CHEST PE WITH CONTRAST CT ABDOMEN AND PELVIS WITH CONTRAST 01/23/2025 02:02:44 AM TECHNIQUE: CTA of the chest was performed after the administration of 75 mL of iohexol  (OMNIPAQUE ) 350 MG/ML injection. Multiplanar reformatted images are provided for review. MIP images are provided for review. CT of the abdomen and pelvis was performed with the administration of intravenous contrast. Automated exposure control, iterative reconstruction, and/or weight based adjustment of the mA/kV was utilized to reduce the radiation dose to as low as reasonably achievable. COMPARISON: PET CT from 01/06/2024, CTA chest, abdomen, and pelvis 11/26/2021. Most recent chest x-ray was portable chest from yesterday. CLINICAL HISTORY: Syncopal episode last night when the patient stood  up from eating, woke up altered. Pulmonary embolism is suspected. Abdominal pain also noted. FINDINGS: CHEST: PULMONARY ARTERIES: Pulmonary arteries are adequately opacified for evaluation. No intraluminal filling defect to suggest pulmonary embolism. Main pulmonary artery is normal in caliber. MEDIASTINUM: The heart is slightly enlarged. There are left main and 3-vessel coronary artery calcifications. No pericardial effusion. There is atherosclerosis of the thoracic aorta and great vessels with mixed plaques, which are predominantly calcific. There is no aortic aneurysm, stenosis, or dissection. No mediastinal, hilar, or axillary adenopathy. No substantial thyroid  nodule requiring follow-up. LUNGS AND PLEURA: Some respiratory motion is seen limiting evaluation of the lung fields. There is diffuse bronchial thickening. There is mild posterior atelectasis. There are scattered subsegmental bronchial impactions in the lower lobes, but no focal pneumonia is seen. No suspicious nodules are visible through the breathing motion. No pleural effusion or pneumothorax. SOFT TISSUES AND BONES: Mild for age degenerative changes of the thoracic spine. No acute or significant thoracic osseous findings. No focal soft tissue abnormality. ABDOMEN AND PELVIS: LIVER: The liver is unremarkable. GALLBLADDER AND BILE DUCTS: Gallbladder is unremarkable. No biliary ductal dilatation. SPLEEN: Spleen demonstrates no acute abnormality. PANCREAS: Pancreas  demonstrates no acute abnormality. ADRENAL GLANDS: Adrenal glands demonstrate no acute abnormality. No adrenal mass. KIDNEYS, URETERS AND BLADDER: There are bilateral multiple Bosniak category I and II renal cysts. No imaging follow-up is required. No renal mass. No stones in the kidneys or ureters. No hydronephrosis. No perinephric or periureteral stranding. There is mild bladder thickening versus underdistention. Correlate with urinalysis for possible significance. GI AND BOWEL: Mild fluid  filling in the stomach without wall thickening. There is no small bowel obstruction or inflammation. There is a normal appendix. There is a thickened wall versus underdistension of the ascending and proximal transverse colon. Correlate clinically for underlying colitis. There is scattered diverticulosis along the left colon without evidence of diverticulitis. REPRODUCTIVE: There are fiducial markers in the prostate. The prostate is no longer enlarged. PERITONEUM AND RETROPERITONEUM: There are calcified lymph nodes in the mesenteric root. No noncalcified adenopathy is seen. There is moderate aortoiliac calcific plaque without aneurysms or dissection, with branch vessel atherosclerosis. There is a small umbilical fat hernia. There is a left inguinal hernia containing unobstructed small bowel loops, as before; there is no evidence for hernia incarceration. No free fluid, free hemorrhage, or free air. LYMPH NODES: Calcified lymph nodes in the mesenteric root. No noncalcified adenopathy is seen. BONES AND SOFT TISSUES: Degenerative changes of the lumbar spine, moderate arthrosis at the hip joints. Osteopenia. There is acquired spinal stenosis at L4-L5. No suspicious bone lesion is seen. No focal soft tissue abnormality. IMPRESSION: 1. No pulmonary embolism is seen . 2. Thickened wall versus underdistension of the ascending and proximal transverse colon, which may reflect colitis. 3. Mild bladder wall thickening versus underdistention; consider urinalysis. 4. Bronchitis with scattered subsegmental lower lobe bronchial impactions without infiltrates. 5. Left inguinal hernia containing unobstructed small bowel loops. No evidence of hernia incarceration. 6. Fiducial markers noted in the prostate gland, not seen previously. The prostate has decreased in size. Prior history of prostate cancer. 7. Remaining findings discussed above. Electronically signed by: Francis Quam MD 01/23/2025 05:34 AM EST RP Workstation: [id number]    CT ABDOMEN PELVIS W CONTRAST Result Date: 01/23/2025 EXAM: CTA CHEST PE WITH CONTRAST CT ABDOMEN AND PELVIS WITH CONTRAST 01/23/2025 02:02:44 AM TECHNIQUE: CTA of the chest was performed after the administration of 75 mL of iohexol  (OMNIPAQUE ) 350 MG/ML injection. Multiplanar reformatted images are provided for review. MIP images are provided for review. CT of the abdomen and pelvis was performed with the administration of intravenous contrast. Automated exposure control, iterative reconstruction, and/or weight based adjustment of the mA/kV was utilized to reduce the radiation dose to as low as reasonably achievable. COMPARISON: PET CT from 01/06/2024, CTA chest, abdomen, and pelvis 11/26/2021. Most recent chest x-ray was portable chest from yesterday. CLINICAL HISTORY: Syncopal episode last night when the patient stood up from eating, woke up altered. Pulmonary embolism is suspected. Abdominal pain also noted. FINDINGS: CHEST: PULMONARY ARTERIES: Pulmonary arteries are adequately opacified for evaluation. No intraluminal filling defect to suggest pulmonary embolism. Main pulmonary artery is normal in caliber. MEDIASTINUM: The heart is slightly enlarged. There are left main and 3-vessel coronary artery calcifications. No pericardial effusion. There is atherosclerosis of the thoracic aorta and great vessels with mixed plaques, which are predominantly calcific. There is no aortic aneurysm, stenosis, or dissection. No mediastinal, hilar, or axillary adenopathy. No substantial thyroid  nodule requiring follow-up. LUNGS AND PLEURA: Some respiratory motion is seen limiting evaluation of the lung fields. There is diffuse bronchial thickening. There is mild posterior atelectasis. There are scattered subsegmental bronchial  impactions in the lower lobes, but no focal pneumonia is seen. No suspicious nodules are visible through the breathing motion. No pleural effusion or pneumothorax. SOFT TISSUES AND BONES: Mild for age  degenerative changes of the thoracic spine. No acute or significant thoracic osseous findings. No focal soft tissue abnormality. ABDOMEN AND PELVIS: LIVER: The liver is unremarkable. GALLBLADDER AND BILE DUCTS: Gallbladder is unremarkable. No biliary ductal dilatation. SPLEEN: Spleen demonstrates no acute abnormality. PANCREAS: Pancreas demonstrates no acute abnormality. ADRENAL GLANDS: Adrenal glands demonstrate no acute abnormality. No adrenal mass. KIDNEYS, URETERS AND BLADDER: There are bilateral multiple Bosniak category I and II renal cysts. No imaging follow-up is required. No renal mass. No stones in the kidneys or ureters. No hydronephrosis. No perinephric or periureteral stranding. There is mild bladder thickening versus underdistention. Correlate with urinalysis for possible significance. GI AND BOWEL: Mild fluid filling in the stomach without wall thickening. There is no small bowel obstruction or inflammation. There is a normal appendix. There is a thickened wall versus underdistension of the ascending and proximal transverse colon. Correlate clinically for underlying colitis. There is scattered diverticulosis along the left colon without evidence of diverticulitis. REPRODUCTIVE: There are fiducial markers in the prostate. The prostate is no longer enlarged. PERITONEUM AND RETROPERITONEUM: There are calcified lymph nodes in the mesenteric root. No noncalcified adenopathy is seen. There is moderate aortoiliac calcific plaque without aneurysms or dissection, with branch vessel atherosclerosis. There is a small umbilical fat hernia. There is a left inguinal hernia containing unobstructed small bowel loops, as before; there is no evidence for hernia incarceration. No free fluid, free hemorrhage, or free air. LYMPH NODES: Calcified lymph nodes in the mesenteric root. No noncalcified adenopathy is seen. BONES AND SOFT TISSUES: Degenerative changes of the lumbar spine, moderate arthrosis at the hip joints.  Osteopenia. There is acquired spinal stenosis at L4-L5. No suspicious bone lesion is seen. No focal soft tissue abnormality. IMPRESSION: 1. No pulmonary embolism is seen . 2. Thickened wall versus underdistension of the ascending and proximal transverse colon, which may reflect colitis. 3. Mild bladder wall thickening versus underdistention; consider urinalysis. 4. Bronchitis with scattered subsegmental lower lobe bronchial impactions without infiltrates. 5. Left inguinal hernia containing unobstructed small bowel loops. No evidence of hernia incarceration. 6. Fiducial markers noted in the prostate gland, not seen previously. The prostate has decreased in size. Prior history of prostate cancer. 7. Remaining findings discussed above. Electronically signed by: Francis Quam MD 01/23/2025 05:34 AM EST RP Workstation: [id number]   CT Hip Left Wo Contrast Result Date: 01/23/2025 EXAM: CT OF THE LEFT HIP WITHOUT IV CONTRAST 01/22/2025 11:17:54 PM TECHNIQUE: CT of the left hip was performed without the administration of intravenous contrast. Multiplanar reformatted images are provided for review. Automated exposure control, iterative reconstruction, and/or weight based adjustment of the mA/kV was utilized to reduce the radiation dose to as low as reasonably achievable. COMPARISON: PET/CT from 10/26/2024. Radiographs were not obtained prior to the current CT. CLINICAL HISTORY: Hip trauma, fracture suspected, no prior imaging. FINDINGS: BONES: Acute transverse oblique subcapital proximal left femoral fracture, slightly impacted, otherwise nondisplaced. Osteopenia. The visualized left hemisacrum, visualized left pelvis including pubic rami, and remainder of the proximal left femur are intact. No aggressive appearing osseous abnormality or periostitis. No pathologic bone lesion is seen. SOFT TISSUE: Left inguinal hernia containing a nonobstructed loop of the small bowel. This was seen previously. Patchy left iliofemoral  calcific arteriosclerosis. 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  Both testicles are in the scrotal sac. No significant soft tissue edema or fluid collections (other than the hernia contents). No soft tissue mass (other than the lipoma and hernia contents). JOINT: Moderate superior joint space loss at the left hip with small acetabular and femoral head osteophytes. Small spurring at the symphysis and SI joints. Mild enthesopathic change of the left femoral trochanters. No osseous erosions. INTRAPELVIC CONTENTS: Limited images of the intrapelvic contents. Fiducial markers are newly noted in the prostate gland. No left pelvic mass, fluid collection, or adenopathy. No acute pelvic bowel abnormality. The visualized bladder is thickened but is also not fully distended. Clinical correlation recommended for cystitis. Left inguinal hernia containing a nonobstructed loop of the small bowel. Small spurring at the symphysis and SI joints. IMPRESSION: 1. Acute transverse oblique subcapital proximal left femoral fracture, slightly impacted and otherwise nondisplaced. 2. Thickened bladder, not fully distended; consider cystitis. 3. Left inguinal hernia containing a nonobstructed loop of the small bowel. 4. New finding of fiducial markers in the prostate gland. 5. Calcific arteriosclerosis. Electronically signed by: Francis Quam MD 01/23/2025 12:53 AM EST RP Workstation: [id number]   DG Chest Portable 1 View Result Date: 01/23/2025 EXAM: 1 VIEW(S) XRAY OF THE CHEST 01/22/2025 11:54:27 PM COMPARISON: AP lateral chest 11/26/2021. CLINICAL HISTORY: near syncope. Near syncope. FINDINGS: LUNGS AND PLEURA: No focal pulmonary opacity. No pleural effusion. No pneumothorax. HEART AND MEDIASTINUM: The aorta is tortuous with calcification in the transverse segment. The mediastinum is stable. The cardiac size is normal. BONES AND SOFT TISSUES: There is thoracic spondylosis. Multiple overlying  telemetry leads are present. No acute osseous abnormality. IMPRESSION: 1. No acute findings. Stable chest. Electronically signed by: Francis Quam MD 01/23/2025 12:40 AM EST RP Workstation: [id number]   CT Head Wo Contrast Result Date: 01/23/2025 EXAM: CT HEAD WITHOUT CONTRAST 01/22/2025 11:17:54 PM TECHNIQUE: CT of the head was performed without the administration of intravenous contrast. Automated exposure control, iterative reconstruction, and/or weight based adjustment of the mA/kV was utilized to reduce the radiation dose to as low as reasonably achievable. COMPARISON: Head CT 11/27/2021. CLINICAL HISTORY: Fall injury with blunt polytrauma. FINDINGS: BRAIN AND VENTRICLES: No acute hemorrhage. No evidence of acute infarct. Moderate cerebral, mild cerebellar global volume loss, and moderately developed small vessel disease of the cerebral white matter with atrophic ventriculomegaly. No extra-axial collection. No mass effect or midline shift. There is patchy calcification in the siphons and distal vertebral arteries. No hyperdense vessel is seen. There is benign dural calcification scattered along the falx. ORBITS: Prior lens replacements; otherwise negative orbits. SINUSES: Multiple retention cysts or polyps in both maxillary sinuses, large is on the left measuring 2.5 cm. Other sinuses and mastoid air cells are clear. SOFT TISSUES AND SKULL: Scarring and calcification in the left frontal scalp. No appreciable scalp hematoma. No acute skull fracture. There are no depressed skull fractures. IMPRESSION: 1. No acute intracranial CT findings or depressed skull fractures. 2. Atrophy, small vessel changes, and vascular calcification. Stable exam. Electronically signed by: Francis Quam MD 01/23/2025 12:37 AM EST RP Workstation: [id number]    Microbiology: Results for orders placed or performed during the hospital encounter of 01/22/25  Resp panel by RT-PCR (RSV, Flu A&B, Covid) Urine, Clean Catch     Status:  None   Collection Time: 01/22/25 10:41 PM   Specimen: Urine, Clean Catch; Nasal Swab  Result Value Ref Range Status   SARS Coronavirus 2 by RT PCR NEGATIVE NEGATIVE Final   Influenza A by PCR NEGATIVE NEGATIVE Final  Influenza B by PCR NEGATIVE NEGATIVE Final    Comment: (NOTE) The Xpert Xpress SARS-CoV-2/FLU/RSV plus assay is intended as an aid in the diagnosis of influenza from Nasopharyngeal swab specimens and should not be used as a sole basis for treatment. Nasal washings and aspirates are unacceptable for Xpert Xpress SARS-CoV-2/FLU/RSV testing.  Fact Sheet for Patients: bloggercourse.com  Fact Sheet for Healthcare Providers: seriousbroker.it  This test is not yet approved or cleared by the [place]  FDA and has been authorized for detection and/or diagnosis of SARS-CoV-2 by FDA under an Emergency Use Authorization (EUA). This EUA will remain in effect (meaning this test can be used) for the duration of the COVID-19 declaration under Section 564(b)(1) of the Act, 21 U.S.C. section 360bbb-3(b)(1), unless the authorization is terminated or revoked.     Resp Syncytial Virus by PCR NEGATIVE NEGATIVE Final    Comment: (NOTE) Fact Sheet for Patients: bloggercourse.com  Fact Sheet for Healthcare Providers: seriousbroker.it  This test is not yet approved or cleared by the [place]  FDA and has been authorized for detection and/or diagnosis of SARS-CoV-2 by FDA under an Emergency Use Authorization (EUA). This EUA will remain in effect (meaning this test can be used) for the duration of the COVID-19 declaration under Section 564(b)(1) of the Act, 21 U.S.C. section 360bbb-3(b)(1), unless the authorization is terminated or revoked.  Performed at [facility], [address], [place], [place] [address]   Blood Culture (routine x 2)     Status: None   Collection  Time: 01/23/25  1:25 AM   Specimen: BLOOD RIGHT HAND  Result Value Ref Range Status   Specimen Description BLOOD RIGHT HAND  Final   Special Requests   Final    BOTTLES DRAWN AEROBIC AND ANAEROBIC Blood Culture results may not be optimal due to an inadequate volume of blood received in culture bottles   Culture   Final    NO GROWTH 5 DAYS Performed at [facility], [address] [place], [place] [address]    Report Status 01/28/2025 FINAL  Final  Blood Culture (routine x 2)     Status: None   Collection Time: 01/23/25  2:27 AM   Specimen: BLOOD LEFT HAND  Result Value Ref Range Status   Specimen Description BLOOD LEFT HAND  Final   Special Requests   Final    BOTTLES DRAWN AEROBIC ONLY Blood Culture results may not be optimal due to an inadequate volume of blood received in culture bottles   Culture   Final    NO GROWTH 5 DAYS Performed at [facility], [address], [place], [place] [address]    Report Status 01/28/2025 FINAL  Final  Respiratory (~20 pathogens) panel by PCR     Status: None   Collection Time: 01/23/25  8:58 AM   Specimen: Nasopharyngeal Swab; Respiratory  Result Value Ref Range Status   Adenovirus NOT DETECTED NOT DETECTED Final   Coronavirus 229E NOT DETECTED NOT DETECTED Final    Comment: (NOTE) The Coronavirus on the Respiratory Panel, DOES NOT test for the novel  Coronavirus (2019 nCoV)    Coronavirus HKU1 NOT DETECTED NOT DETECTED Final   Coronavirus NL63 NOT DETECTED NOT DETECTED Final   Coronavirus OC43 NOT DETECTED NOT DETECTED Final   Metapneumovirus NOT DETECTED NOT DETECTED Final   Rhinovirus / Enterovirus NOT DETECTED NOT DETECTED Final   Influenza A NOT DETECTED NOT DETECTED Final   Influenza B NOT DETECTED NOT DETECTED Final  Parainfluenza Virus 1 NOT DETECTED NOT DETECTED Final   Parainfluenza Virus 2 NOT DETECTED NOT DETECTED Final   Parainfluenza Virus 3 NOT DETECTED NOT DETECTED Final   Parainfluenza Virus 4 NOT DETECTED  NOT DETECTED Final   Respiratory Syncytial Virus NOT DETECTED NOT DETECTED Final   Bordetella pertussis NOT DETECTED NOT DETECTED Final   Bordetella Parapertussis NOT DETECTED NOT DETECTED Final   Chlamydophila pneumoniae NOT DETECTED NOT DETECTED Final   Mycoplasma pneumoniae NOT DETECTED NOT DETECTED Final    Comment: Performed at [facility], [address] [place], [place] [address]  Surgical PCR screen     Status: None   Collection Time: 01/24/25 12:39 PM   Specimen: Nasal Mucosa; Nasal Swab  Result Value Ref Range Status   MRSA, PCR NEGATIVE NEGATIVE Final   Staphylococcus aureus NEGATIVE NEGATIVE Final    Comment: (NOTE) The Xpert SA Assay (FDA approved for NASAL specimens in patients 25 years of age and older), is one component of a comprehensive surveillance program. It is not intended to diagnose infection nor to guide or monitor treatment. Performed at [facility], [address] [place], [place] [address]     Labs: CBC: Recent Labs  Lab 01/22/25 2142 01/24/25 0557 01/25/25 0521 01/26/25 0601 01/27/25 0540  WBC 4.7 5.8 4.3 4.9 3.4*  NEUTROABS  --   --   --   --  2.0  HGB 10.4* 9.4* 9.9* 10.2* 10.5*  HCT 31.4* 28.3* 30.2* 30.2* 32.3*  MCV 95.2 93.4 94.1 92.9 94.7  PLT 244 198 202 211 224   Basic Metabolic Panel: Recent Labs  Lab 01/23/25 0838 01/24/25 0557 01/25/25 0521 01/26/25 0601 01/27/25 0540 01/28/25 0412  NA 138 139 138 139 138  --   K 5.9* 5.0 4.7 5.0 4.7  --   CL 105 107 103 105 104  --   CO2 23 24 23 27 25   --   GLUCOSE 120* 90 93 112* 96  --   BUN 32* 26* 27* 25* 29*  --   CREATININE 1.86* 1.60* 1.67* 1.58* 1.49*  --   CALCIUM  9.3 9.1 9.0 9.2 9.4  --   MG  --   --   --   --   --  1.9   Liver Function Tests: Recent Labs  Lab 01/22/25 2355  AST 20  ALT 15  ALKPHOS 102  BILITOT 0.4  PROT 6.7  ALBUMIN 3.8   CBG: Recent Labs  Lab 01/22/25 2150 01/25/25 0651 01/25/25 0946  GLUCAP 107* 93 103*    Discharge  time spent: less than 30 minutes.  Signed: Garnette Pelt, MD [facility] 01/28/2025 "

## 2025-01-28 NOTE — Care Management Important Message (Signed)
 Important Message  Patient Details  Name: [name]. MRN: [id number] Date of Birth: Sep 26, 1932   Important Message Given:  No     Jennie Laneta Dragon 01/28/2025, 4:23 PM

## 2025-01-28 NOTE — Progress Notes (Signed)
 Report given to [facility] at $W13. All questions and concerns were fully answered, Pt remains alert/oriented in no apparent distress. Family to accompany pt .

## 2025-01-28 NOTE — Progress Notes (Signed)
 Inpatient Rehabilitation Admission Medication Review by a Pharmacist  See note by Donny Alert from 02/06 8:41AM

## 2025-01-28 NOTE — Progress Notes (Signed)
 IP rehab admissions - Bed available on inpatient rehab today and will admit to CIR today.  [phone]

## 2025-01-28 NOTE — Progress Notes (Signed)
 Patient discharged, Important Message Letter mailed to patient.

## 2025-01-28 NOTE — Progress Notes (Signed)
 Physical Therapy Treatment Patient Details Name: [name]. MRN: [id number] DOB: 1932-06-26 Today's Date: 01/28/2025   History of Present Illness Pt is a 89 y.o. M who presents 01/22/2025 with a fall after standing up and presyncopal episode. Pt found to have closed subcapital fracture of left femur now s/p cannulated screw fixation of left femoral neck fracture 01/25/2025. PMH includes PE, myasthenia gravis (ocular), DM type II, CKD stage II, aortic artherosclerosis, HTN, HLD, osteoarthritis, iliac artery aneurysm, prostate CA.    PT Comments  Pt eager to participate and progressing steadily towards his physical therapy goals. Performed bed level LLE AROM exercises. Exiting bed towards left side with HOB elevated 16 degrees without physical assist. Pt requiring assist for LB dressing in seated position. Pt ambulating 90 ft with RW and slowed step to pattern with consistent cueing for technique and weightbearing precautions. Pt family at bedside and supportive. Patient will benefit from intensive inpatient follow-up therapy, >3 hours/day for further strengthening, ROM, balance training, gait training, ADL management and education.    If plan is discharge home, recommend the following: A little help with walking and/or transfers;A little help with bathing/dressing/bathroom;Assistance with cooking/housework;Assist for transportation;Help with stairs or ramp for entrance   Can travel by private vehicle        Equipment Recommendations  None recommended by PT (pt has RW, 3 in 1)    Recommendations for Other Services       Precautions / Restrictions Precautions Precautions: Fall Precaution/Restrictions Comments: No ROM restrictions L hip/knee Restrictions Weight Bearing Restrictions Per Provider Order: Yes LLE Weight Bearing Per Provider Order: Partial weight bearing LLE Partial Weight Bearing Percentage or Pounds: 50 Other Position/Activity Restrictions: Discussed WB restrictions with  [name] 2/3     Mobility  Bed Mobility Overal bed mobility: Needs Assistance Bed Mobility: Supine to Sit     Supine to sit: Supervision     General bed mobility comments: HOB elevated 16 degrees, increased time    Transfers Overall transfer level: Needs assistance Equipment used: Rolling walker (2 wheels) Transfers: Sit to/from Stand Sit to Stand: Contact guard assist           General transfer comment: Verbal cues for bringing LLE anteriorly prior to transition to standing or sitting    Ambulation/Gait Ambulation/Gait assistance: Contact guard assist Gait Distance (Feet): 90 Feet Assistive device: Rolling walker (2 wheels) Gait Pattern/deviations: Step-to pattern Gait velocity: decreased     General Gait Details: Slowed step to pattern. Verbal cues for walker use, weightbearing precautions, upward gaze   Stairs             Wheelchair Mobility     Tilt Bed    Modified Rankin (Stroke Patients Only)       Balance Overall balance assessment: Needs assistance Sitting-balance support: Feet supported Sitting balance-Leahy Scale: Fair     Standing balance support: Bilateral upper extremity supported Standing balance-Leahy Scale: Poor                              Communication Communication Communication: Impaired Factors Affecting Communication: Hearing impaired  Cognition Arousal: Alert Behavior During Therapy: WFL for tasks assessed/performed   PT - Cognitive impairments: No apparent impairments                         Following commands: Intact      Cueing Cueing Techniques: Verbal cues, Tactile cues, Visual cues  Exercises General Exercises - Lower Extremity Heel Slides: AROM, Left, 10 reps, Supine Hip ABduction/ADduction: AROM, Left, 10 reps, Supine Straight Leg Raises: AROM, Left, 10 reps, Supine    General Comments        Pertinent Vitals/Pain Pain Assessment Pain Assessment: 0-10 Faces Pain Scale:  Hurts a little bit Pain Location: L hip Pain Descriptors / Indicators: Operative site guarding Pain Intervention(s): Premedicated before session, Monitored during session    Home Living                          Prior Function            PT Goals (current goals can now be found in the care plan section) Acute Rehab PT Goals Patient Stated Goal: go home PT Goal Formulation: With patient/family Time For Goal Achievement: 02/09/25 Potential to Achieve Goals: Good Progress towards PT goals: Progressing toward goals    Frequency    Min 3X/week      PT Plan      Co-evaluation              AM-PAC PT 6 Clicks Mobility   Outcome Measure  Help needed turning from your back to your side while in a flat bed without using bedrails?: A Little Help needed moving from lying on your back to sitting on the side of a flat bed without using bedrails?: A Little Help needed moving to and from a bed to a chair (including a wheelchair)?: A Little Help needed standing up from a chair using your arms (e.g., wheelchair or bedside chair)?: A Little Help needed to walk in hospital room?: A Little Help needed climbing 3-5 steps with a railing? : A Lot 6 Click Score: 17    End of Session Equipment Utilized During Treatment: Gait belt Activity Tolerance: Patient tolerated treatment well Patient left: with call bell/phone within reach;in bed;with bed alarm set;with family/visitor present Nurse Communication: Mobility status PT Visit Diagnosis: Unsteadiness on feet (R26.81);History of falling (Z91.81);Difficulty in walking, not elsewhere classified (R26.2);Pain Pain - Right/Left: Left Pain - part of body: Hip     Time: 9180-9148 PT Time Calculation (min) (ACUTE ONLY): 32 min  Charges:    $Gait Training: 8-22 mins $Therapeutic Activity: 8-22 mins PT General Charges $$ ACUTE PT VISIT: 1 Visit                     Aleck Daring, PT, DPT Acute Rehabilitation  Services Office [phone]    Aleck ONEIDA Daring 01/28/2025, 9:28 AM

## 2025-01-28 NOTE — Progress Notes (Addendum)
 Orthostatics reviewed with Dr. Raford -> no change to med plan but give 250cc NS bolus prior to transfer. Encourage oral hydration. Would be ideal to recheck orthostatics in AM. Please notify cardiology if assistance needed from our team while at [facility] tomorrow going forward.

## 2025-01-28 NOTE — Progress Notes (Signed)
 Occupational Therapy Treatment Patient Details Name: [name]. MRN: [id number] DOB: 1932-05-06 Today's Date: 01/28/2025   History of present illness Pt is a 89 y.o. M who presents 01/22/2025 with a fall after standing up and presyncopal episode. Pt found to have closed subcapital fracture of left femur now s/p cannulated screw fixation of left femoral neck fracture 01/25/2025. PMH includes PE, myasthenia gravis (ocular), DM type II, CKD stage II, aortic artherosclerosis, HTN, HLD, osteoarthritis, iliac artery aneurysm, prostate CA.   OT comments  Patient agreeable to participate in OT intervention this date.  Patient sitting in bedside chair upon arrival into room.  Patient completed sit to stand from chair with CGA and completed functional mobility into bathroom with RW and CGA and completed UB grooming task while standing at sink with CGA.  Patient demo's fair+ standing activity tolerance and safety.  Patient returned to sitting in chair and left with all need within reach.  Patient would benefit from additional OT intervention to address functional deficits of overall indep in LB ADLs, safety, fall prevention and overall activity tolerance.      If plan is discharge home, recommend the following:  A lot of help with bathing/dressing/bathroom;A little help with walking and/or transfers;Assist for transportation   Equipment Recommendations  Tub/shower seat    Recommendations for Other Services      Precautions / Restrictions Precautions Precautions: Fall Recall of Precautions/Restrictions: Intact Precaution/Restrictions Comments: No ROM restrictions L hip/knee Restrictions Weight Bearing Restrictions Per Provider Order: Yes LLE Weight Bearing Per Provider Order: Partial weight bearing LLE Partial Weight Bearing Percentage or Pounds: 50 Other Position/Activity Restrictions: Discussed WB restrictions with [name] 2/3       Mobility Bed Mobility Overal bed mobility:  (patient  sitting up in chair upona arrival into room)                  Transfers Overall transfer level: Needs assistance Equipment used: Rolling walker (2 wheels) Transfers: Sit to/from Stand Sit to Stand: Contact guard assist                 Balance Overall balance assessment: Needs assistance Sitting-balance support: Feet supported Sitting balance-Leahy Scale: Fair                                     ADL either performed or assessed with clinical judgement   ADL Overall ADL's : Needs assistance/impaired     Grooming: Wash/dry hands;Wash/dry face;Oral care;Standing;Contact guard assist                   Toilet Transfer: Minimal assistance   Toileting- Clothing Manipulation and Hygiene: Minimal assistance       Functional mobility during ADLs: Contact guard assist      Extremity/Trunk Assessment              Vision       Perception     Praxis     Communication Communication Factors Affecting Communication: Hearing impaired   Cognition Arousal: Alert Behavior During Therapy: WFL for tasks assessed/performed Cognition: No apparent impairments                                        Cueing      Exercises      Shoulder Instructions  General Comments      Pertinent Vitals/ Pain       Pain Assessment Pain Assessment: 0-10 Pain Score: 2  Faces Pain Scale: Hurts a little bit Pain Location: L hip Pain Intervention(s): Monitored during session  Home Living                                          Prior Functioning/Environment              Frequency  Min 2X/week        Progress Toward Goals  OT Goals(current goals can now be found in the care plan section)  Progress towards OT goals: Progressing toward goals  Acute Rehab OT Goals OT Goal Formulation: With patient Time For Goal Achievement: 02/09/25 Potential to Achieve Goals: Good ADL Goals Pt Will Perform  Grooming: with supervision;standing Pt Will Perform Lower Body Dressing: with min assist;sit to/from stand Pt Will Transfer to Toilet: with modified independence;ambulating;regular height toilet  Plan      Co-evaluation                 AM-PAC OT 6 Clicks Daily Activity     Outcome Measure   Help from another person eating meals?: None Help from another person taking care of personal grooming?: A Little Help from another person toileting, which includes using toliet, bedpan, or urinal?: A Little Help from another person bathing (including washing, rinsing, drying)?: A Lot Help from another person to put on and taking off regular upper body clothing?: None Help from another person to put on and taking off regular lower body clothing?: A Lot 6 Click Score: 18    End of Session Equipment Utilized During Treatment: Rolling walker (2 wheels)  OT Visit Diagnosis: Unsteadiness on feet (R26.81);Muscle weakness (generalized) (M62.81);Pain Pain - Right/Left: Left Pain - part of body: Hip;Leg   Activity Tolerance Patient tolerated treatment well   Patient Left in chair;with call bell/phone within reach;with chair alarm set   Nurse Communication Mobility status        Time: 9045-8979 OT Time Calculation (min): 26 min  Charges: OT General Charges $OT Visit: 1 Visit OT Treatments $Self Care/Home Management : 23-37 mins  Lamarr Pouch OT/L  Lamarr JONETTA Pouch 01/28/2025, 1:54 PM

## 2025-01-28 NOTE — Plan of Care (Signed)
   Problem: Education: Goal: Knowledge of General Education information will improve Description: Including pain rating scale, medication(s)/side effects and non-pharmacologic comfort measures Outcome: Progressing   Problem: Activity: Goal: Risk for activity intolerance will decrease Outcome: Progressing

## 2025-01-28 NOTE — Plan of Care (Signed)
" °  Problem: Pain Managment: Goal: General experience of comfort will improve and/or be controlled Outcome: Progressing   Problem: Safety: Goal: Ability to remain free from injury will improve Outcome: Progressing   Problem: Nutritional: Goal: Will attain and maintain optimal nutritional status Outcome: Progressing   Problem: Clinical Measurements: Goal: Postoperative complications will be avoided or minimized Outcome: Progressing   "

## 2025-01-28 NOTE — H&P (Signed)
 "     Physical Medicine and Rehabilitation Admission H&P        Chief Complaint  Patient presents with   Functional deficits due to L subcapital femur fx    HPI: [name]. 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 Per chart review the patient experienced an unwitnessed fall at home where he reported getting woozy headed leading him to fall over. He denied hitting his head but noted muscle weakness and was unable to get up and not sure if he had LOC. In the ER vitals signs were unremarkable, however an episode of vomiting and tachycardia were noted by EDP. Repeat EKG showed sinus tachycardia with HR of 139, no other acute findings.  Temp of 101.2 F with respirations 13-23, blood pressures elevated up to 168/92, and O2 saturations noted to be as low as 91% which he was placed on 2 L nasal cannula oxygen.  Labs: WBC 4.7, hemoglobin 10.4, K 5.2, BUN 26, creatinine 1.66, high-sensitivity troponin 48, and lactic acid 1.3.     CT scan of the head did not reveal any acute abnormality.  CT scan of the hip revealed acute transverse oblique subcapital proximal left femoral fracture with slight impaction, left inguinal hernia containing nonobstructed loop of small bowel, and new fiduciary markers in the prostate. He was found to have a closed subcapital fracture of left femur and underwent cannulated screw fixation of left neck fracture on 01/25/25 by Dr. Celena. DVT prophylaxis Lovenox. Xarelto  was resumed on 2/4.  Fever was felt likely colitis vs aspirational pneumonia (vomited after presyncopal event). Respiratory panel screening were negative. Urinalysis showed no significant signs for infection. Blood cultures were obtained showed no growth to  date.   CT angiogram of the chest, abdomen, pelvis was obtained noting no pulmonary embolism, mild bladder wall thickening versus underdistention, thickened wall versus underdistention of the ascending and proximal transverse colon possibly reflecting colitis, and bronchitis with scattered subsegmental lower lobe bronchial impactions without infiltrates. IV antibiotics Ceftriaxone  and Flagyl  completed on 2/5.    Presyncopal work up was done, 2D echo showed EF 50-55% and noted regional wall motion abnormality, grade 1 diastolic dysfunction. Cardiology was consulted and recommended medial management with live 14 day monitor and continuation of  Xarelto , Irbesartan  and Pravastatin  was increased to 40 mg daily. Orthostatics were reviewed with Dr. Raford, no plans to change medication regimen but an IV fluid bolus prior to transfer rehab of 250cc. Prior to admission the patient was active, golf's during warmer weather without use of DME. He resides with his spouse in a one level home with 3 steps to enter.  Therapies have been on going and he currently requires CGA to min assist with mobility and basic ADLs.    Pt reports a sore spot on L hand from falling- When turns the wrong way, pain like lightning- Norco helps this. Usually taking ~1x/day-  LBM today   Of note, Vit D level was 7.9 and  pt was c/o mild dizziness and lightheadedness intermittently prior to transfer and last few days with therapy.    Pt admits it's hard to maintain 50% PWB and just leans a lot on his arms and per son was told by someone other  than surgeon, it's OK to put weight on broken leg.  So he said they were confused because had heard differing ideas of what to do.    Review of Systems  Constitutional:  Positive for malaise/fatigue.  HENT:  Positive for hearing loss.   Eyes: Negative.   Respiratory: Negative.  Negative for cough and wheezing.   Cardiovascular:  Negative for chest pain and leg swelling.  Gastrointestinal:   Positive for heartburn. Negative for constipation and nausea.  Genitourinary: Negative.  Negative for dysuria and frequency.  Musculoskeletal:  Positive for joint pain.  Skin: Negative.   Neurological:  Positive for dizziness and focal weakness.  Endo/Heme/Allergies: Negative.   Psychiatric/Behavioral: Negative.    All other systems reviewed and are negative.                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   Osteoarthritis     Pulmonary embolism (HCC)               Past Surgical History:  Procedure Laterality Date   GOLD SEED IMPLANT N/A 04/17/2024    Procedure: INSERTION, GOLD SEEDS;  Surgeon: Elisabeth Valli BIRCH, MD;  Location: [facility] OR;  Service: Urology;  Laterality: N/A;   KNEE SURGERY       LITHOTRIPSY       PROSTATE BIOPSY       PROSTATE SURGERY       SPACE OAR INSTILLATION N/A 04/17/2024    Procedure: INJECTION, HYDROGEL SPACER;  Surgeon: Elisabeth Valli BIRCH, MD;  Location: [facility] OR;  Service: Urology;  Laterality: N/A;             Family History  Problem Relation Age of Onset   Heart attack Father     Sarcoidosis Brother          Social History:  reports that he has quit smoking. His smoking use included cigarettes. He has quit using smokeless tobacco.  His smokeless tobacco use included chew. He reports that he does not drink alcohol  and does not use drugs. Allergies: [Allergies]  [Allergies]      Allergen Reactions   Aspirin Other (See Comments)      REACTION: full strength; causes gi irritation    Nsaids        Other Reaction(s): Not available         Medications Prior to Admission  Medication Sig Dispense Refill   albuterol  (VENTOLIN  HFA) 108 (90 Base) MCG/ACT inhaler Inhale 2 puffs into the lungs every 6 (six) hours as needed for wheezing.       amLODipine  (NORVASC ) 10 MG tablet Take 1 tablet (10 mg total) by mouth daily. 30 tablet 3   Ascorbic Acid (VITAMIN C PO) Take 1 tablet by mouth See admin instructions. Take one tablet by mouth with iron (ferrous sulfate) - occasionally       FERROUS SULFATE  PO Take 1 tablet by mouth See admin instructions. Take one tablet by mouth with vitamin c - occasionally       hydrALAZINE  (APRESOLINE ) 10 MG tablet Take 10 mg by mouth as needed (only take as needed if bp is 170/100.).       [Paused] metFORMIN (GLUCOPHAGE-XR) 500 MG 24 hr tablet Take 500 mg by mouth in the morning and at bedtime.       ORGOVYX 120 MG tablet Take 120 mg by mouth daily.       pantoprazole  (PROTONIX ) 40 MG tablet Take 1 tablet (40 mg total) by mouth 2 (two) times daily before a meal. (Patient taking differently: Take 40 mg by mouth daily.) 60 tablet 0   polyethylene glycol (MIRALAX  / GLYCOLAX ) 17 g packet Take 17 g by mouth 2 (two) times daily. 14 each 0   potassium chloride (KLOR-CON) 10 MEQ tablet Take 10 mEq by mouth daily.       pravastatin  (PRAVACHOL ) 20 MG tablet Take 20 mg by mouth every morning.       telmisartan (MICARDIS) 20 MG tablet Take 20 mg by mouth at bedtime.       TRELEGY ELLIPTA 100-62.5-25 MCG/INH AEPB Inhale 1 puff into the lungs daily as needed (shortness of breath/wheezing).       XARELTO  20 MG TABS tablet Take 20 mg by mouth every morning.                  Home: Home Living Family/patient expects to be discharged to:: Private residence Living Arrangements: Spouse/significant other Available Help at Discharge: Family Type of Home: House Home Access: Stairs to enter Secretary/administrator of Steps: 3 Entrance Stairs-Rails: Right Home Layout:  One level Bathroom Shower/Tub: Health Visitor: Standard Bathroom Accessibility: Yes Home Equipment: Grab bars - tub/shower, Shower seat - built in, BSC/3in1  Lives With: Spouse   Functional History: Prior Function Prior Level of Function : Independent/Modified Independent 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, semi retired ADLs Comments: Ind with ADL,iADL, drives and continues to work part time   Functional Status:  Mobility: Bed Mobility Overal bed mobility: Needs Assistance Bed Mobility: Supine to Sit Supine to sit: Supervision Sit to supine: Min assist General bed mobility comments: HOB elevated 16 degrees, increased time Transfers Overall transfer level: Needs assistance Equipment used: Rolling walker (2 wheels) Transfers: Sit to/from Stand Sit to Stand: Contact guard assist General transfer comment: Verbal cues for bringing LLE anteriorly prior to transition to standing or sitting Ambulation/Gait Ambulation/Gait assistance: Contact guard assist Gait Distance (Feet): 90 Feet Assistive device: Rolling walker (2 wheels) Gait Pattern/deviations: Step-to pattern General Gait Details: Slowed step to pattern. Verbal cues for walker use, weightbearing precautions, upward gaze Gait velocity: decreased Gait velocity interpretation: <1.31 ft/sec, indicative of household ambulator   ADL: ADL Grooming: Wash/dry hands, Wash/dry face, Set up, Sitting Lower Body Dressing: Moderate assistance, Sit to/from stand Toilet Transfer: Minimal assistance, Rolling walker (2 wheels), Regular Toilet, Ambulation   Cognition: Cognition Orientation Level: Oriented X4 Cognition Arousal: Alert Behavior During Therapy: WFL for tasks assessed/performed   Physical Exam: Blood pressure (!) 135/92, pulse 74, temperature 97.9 F (36.6 C), temperature source Oral, resp. rate 17, height 5' 11 (1.803 m), weight 79.4 kg, SpO2 97%. Physical Exam Vitals and nursing note  reviewed. Exam conducted with a chaperone present.  Constitutional:      Appearance: Normal appearance. He is normal weight.     Comments: Pt awake, alert, appropriate, seen walking to  bed with RW- is putting too much weight through LLE, educated pt multiple times; son at bedside, NAD  HENT:     Head: Normocephalic and atraumatic.     Right Ear: External ear normal.     Left Ear: External ear normal.     Nose: Nose normal. No congestion.     Mouth/Throat:     Mouth: Mucous membranes are dry.     Pharynx: No oropharyngeal exudate.  Eyes:     General:        Right eye: No discharge.        Left eye: No discharge.     Extraocular Movements: Extraocular movements intact.  Cardiovascular:     Rate and Rhythm: Normal rate and regular rhythm.     Heart sounds: Normal heart sounds. No murmur heard.    No gallop.  Pulmonary:     Effort: Pulmonary effort is normal. No respiratory distress.     Breath sounds: Normal breath sounds. No wheezing, rhonchi or rales.  Abdominal:     General: Bowel sounds are normal. There is no distension.     Palpations: Abdomen is soft.     Tenderness: There is no abdominal tenderness.  Musculoskeletal:     Cervical back: Neck supple.     Comments: Ue's very strong 5/5 throughout RLE- 5/5 in all muscles tested LLE- at least 3+/5 in HF (limited by pain); KE/DF and PF 4/5 limited by ROM/pain  Skin:    General: Skin is warm and dry.     Comments: R forearm/wrist IV- looks OK Skin torn at base of 5th digit  ray on L hand when hit floor- very TTP surrounding that sore.  Mild edema/erythema in L lateral thigh- with dressing C/D/I  Neurological:     Mental Status: He is alert.     Comments: Ox4- alert and appropriate Intact to light touch in all 4 extremities  Psychiatric:        Mood and Affect: Mood normal.        Behavior: Behavior normal.       Lab Results Last 48 Hours        Results for orders placed or performed during the hospital encounter of  01/22/25 (from the past 48 hours)  CBC with Differential/Platelet     Status: Abnormal    Collection Time: 01/27/25  5:40 AM  Result Value Ref Range    WBC 3.4 (L) 4.0 - 10.5 K/uL    RBC 3.41 (L) 4.22 - 5.81 MIL/uL    Hemoglobin 10.5 (L) 13.0 - 17.0 g/dL    HCT 67.6 (L) 60.9 - 52.0 %    MCV 94.7 80.0 - 100.0 fL    MCH 30.8 26.0 - 34.0 pg    MCHC 32.5 30.0 - 36.0 g/dL    RDW 84.3 (H) 88.4 - 15.5 %    Platelets 224 150 - 400 K/uL    nRBC 0.0 0.0 - 0.2 %    Neutrophils Relative % 58 %    Neutro Abs 2.0 1.7 - 7.7 K/uL    Lymphocytes Relative 16 %    Lymphs Abs 0.6 (L) 0.7 - 4.0 K/uL    Monocytes Relative 14 %    Monocytes Absolute 0.5 0.1 - 1.0 K/uL    Eosinophils Relative 10 %    Eosinophils Absolute 0.4 0.0 - 0.5 K/uL    Basophils Relative 1 %    Basophils Absolute 0.0 0.0 - 0.1 K/uL    Immature Granulocytes 1 %  Abs Immature Granulocytes 0.02 0.00 - 0.07 K/uL      Comment: Performed at [facility], [address] [place], [place] [address]  Basic metabolic panel with GFR     Status: Abnormal    Collection Time: 01/27/25  5:40 AM  Result Value Ref Range    Sodium 138 135 - 145 mmol/L    Potassium 4.7 3.5 - 5.1 mmol/L    Chloride 104 98 - 111 mmol/L    CO2 25 22 - 32 mmol/L    Glucose, Bld 96 70 - 99 mg/dL      Comment: Glucose reference range applies only to samples taken after fasting for at least 8 hours.    BUN 29 (H) 8 - 23 mg/dL    Creatinine, Ser 8.50 (H) 0.61 - 1.24 mg/dL    Calcium  9.4 8.9 - 10.3 mg/dL    GFR, Estimated 44 (L) >60 mL/min      Comment: (NOTE) Calculated using the CKD-EPI Creatinine Equation (2021)      Anion gap 8 5 - 15      Comment: Performed at [facility], [address], [place], [place] [address]  Lipid panel     Status: None    Collection Time: 01/28/25  4:12 AM  Result Value Ref Range    Cholesterol 155 0 - 200 mg/dL      Comment:        ATP III CLASSIFICATION:  <200     mg/dL   Desirable  799-760  mg/dL   Borderline  High  >=759    mg/dL   High             Triglycerides 65 <150 mg/dL    HDL 62 >59 mg/dL    Total CHOL/HDL Ratio 2.5 RATIO    VLDL 13 0 - 40 mg/dL    LDL Cholesterol 80 0 - 99 mg/dL      Comment:        Total Cholesterol/HDL:CHD Risk Coronary Heart Disease Risk Table                     Men   Women  1/2 Average Risk   3.4   3.3  Average Risk       5.0   4.4  2 X Average Risk   9.6   7.1  3 X Average Risk  23.4   11.0        Use the calculated Patient Ratio above and the CHD Risk Table to determine the patient's CHD Risk.        ATP III CLASSIFICATION (LDL):  <100     mg/dL   Optimal  899-870  mg/dL   Near or Above                    Optimal  130-159  mg/dL   Borderline  839-810  mg/dL   High  >809     mg/dL   Very High Performed at [facility], [address], [place], [place] [address]    Magnesium     Status: None    Collection Time: 01/28/25  4:12 AM  Result Value Ref Range    Magnesium 1.9 1.7 - 2.4 mg/dL      Comment: Performed at [facility], [address] [place], [place] [address]  Glucose, capillary     Status: Abnormal    Collection Time: 01/28/25 11:46 AM  Result Value Ref Range    Glucose-Capillary 100 (H) 70 - 99 mg/dL      Comment: Glucose reference range applies only to samples taken after fasting for at least 8 hours.      Imaging Results (Last 48 hours)  No results found.         Blood pressure (!) 135/92, pulse 74, temperature 97.9 F (36.6 C), temperature source Oral, resp. rate 17, height 5' 11 (1.803 m), weight 79.4 kg, SpO2 97%.   Medical Problem List and Plan: 1. Functional deficits secondary to L subcapital closed femur fx s/p cannulated screw fixation- PWB 50% LLE             -patient may  shower             -ELOS/Goals: 5-7 days - mod I to supervision             Admit to CIR- appropriate   2.  Antithrombotics: -DVT/anticoagulation:  Xarelto  for PE             -antiplatelet therapy: n/a   3. Pain Management:  Tylenol  and Vicodin prn    4. Mood/Behavior/Sleep: LCSW to follow for evaluation and support when available.              -antipsychotic agents: n/a             -sleep:    5. Neuropsych/cognition: This patient is capable of making decisions on his own behalf.   6. Skin/Wound Care: Routine pressure relief measures. (#8 Dressings)   7. Fluids/Electrolytes/Nutrition: Monitor I&O and weight. Follow up labs CBC/CMP     8. Left subcapital femur fracture: s/p cannulated screw fixation of  left femur on 2/2 by Dr. Kendal. Follow up with ortho in 2 weeks for suture removal and follow up x-rays.              -PWB LLE with walker (50% BW) no hip or knee restrictions.             -Continue dressing changes- Mepilex or 4x4 gauze and tape. Ice prn for swelling and local pain.    9. Fever: Last spike was on 1/31 without leukocytosis. Finished IV abx on 2/5 (Flagyl  and Rocephin )  -Monitor  vitals and CBC.    10. Presyncope with lightheadedness intermittently-  with wall motion abnormality: Cardiology recc's to continue Xarelto , Irbesartan  and Pravastatin  now 40 mg daily. Follow up outpatient with cardiology.  -14 day Zio patch planned while in CIR.     11.HTN: BP elevation noted likely due to pain.  -Monitor BP per protocol-Continue home med Amlodipine , Avapro  with Hydralazine  PRN.    12. T2DM: most recent A1c 6.1-repeat pending. Home med: Metformin. Glucose stable.   -Monitor glucose [place]/hs with SSI Novolog      13. Chronic kidney disease stage IIIb: Baseline creatinine appears to range from 1.4-1.8   14. 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 Metabolic bone disease/ :fragility fracture/Vit D deficiency: Vit d level 7.9, Continue Vitamin D  supp started in hospital .     17. Prostate cancer: Outpatient follow-up.    18. Orthostatic hypotension- given 250cc bolus prior to rehab for dizziness and  lightheadedness (documented as asymptomatic- per pt having mild Sx's)- will monitor- might need to reduce BP meds.     19. Ocular myasthenic gravis- per Neurology- but if becomes symptomatic, will need to call Neuro consult.  Daphne LOISE Satterfield, NP 01/28/2025     I have personally performed a face to face diagnostic evaluation of this patient and formulated the key components of the plan.  Additionally, I have personally reviewed laboratory data, imaging studies, as well as relevant notes and concur with the physician assistant's documentation above.   The patient's status has not changed from the original H&P.  Any changes in documentation from the acute care chart have been noted above.             "

## 2025-01-28 NOTE — Progress Notes (Signed)
 ZIO AT applied at hospital. Dr. Duke Salvia to read.

## 2025-01-28 NOTE — Progress Notes (Addendum)
 " Progress Note  Patient Name: [name]. Date of Encounter: 01/28/2025  Primary Cardiologist: Annabella Scarce, MD  Subjective   Mild nausea, no CP, SOB, palpitations or dizziness.  Inpatient Medications    Scheduled Meds:  amLODipine   10 mg Oral Daily   docusate sodium   100 mg Oral BID   irbesartan   75 mg Oral Daily   pantoprazole   40 mg Oral Daily   polyethylene glycol  17 g Oral BID   pravastatin   20 mg Oral q morning   rivaroxaban   20 mg Oral Q breakfast   Vitamin D  (Ergocalciferol )  50,000 Units Oral Q7 days   Continuous Infusions:  cefTRIAXone  (ROCEPHIN )  IV 2 g (01/27/25 0742)   metronidazole  500 mg (01/28/25 0647)   PRN Meds: hydrALAZINE , HYDROcodone -acetaminophen , menthol  **OR** phenol, methocarbamol  **OR** methocarbamol  (ROBAXIN ) injection, metoCLOPramide  **OR** metoCLOPramide  (REGLAN ) injection, morphine  injection, ondansetron  **OR** ondansetron  (ZOFRAN ) IV   Vital Signs    Vitals:   01/27/25 1633 01/27/25 1934 01/28/25 0251 01/28/25 0735  BP: 117/62 111/71 (!) 141/70 (!) 135/92  Pulse: 67 67 60 74  Resp: 18 17 17 17   Temp: 98.2 F (36.8 C)  98.2 F (36.8 C) 97.9 F (36.6 C)  TempSrc:    Oral  SpO2: 99% 97% 100% 97%  Weight:      Height:        Intake/Output Summary (Last 24 hours) at 01/28/2025 0922 Last data filed at 01/28/2025 9162 Gross per 24 hour  Intake 240 ml  Output --  Net 240 ml      01/25/2025    6:48 AM 01/22/2025    8:35 PM 04/17/2024    9:51 AM  Last 3 Weights  Weight (lbs) 175 lb 175 lb 170 lb  Weight (kg) 79.379 kg 79.379 kg 77.111 kg     Telemetry    NSR, occasional PACs/PVCs, no pauses/bradycardia - Personally Reviewed  Physical Exam   GEN: No acute distress.  HEENT: Normocephalic, atraumatic, sclera non-icteric. Arcus senilis Neck: No JVD or bruits. Cardiac: RRR no murmurs, rubs, or gallops.  Respiratory: Clear to auscultation bilaterally. Breathing is unlabored. GI: Soft, nontender, non-distended, BS +x 4. MS: no  deformity. Extremities: No clubbing or cyanosis. No edema. Distal pedal pulses are 2+ and equal bilaterally. Neuro:  AAOx3. Follows commands. Psych:  Responds to questions appropriately with a normal affect.  Labs    High Sensitivity Troponin:  No results for input(s): TROPONINIHS in the last 720 hours.    Cardiac EnzymesNo results for input(s): TROPONINI in the last 168 hours. No results for input(s): TROPIPOC in the last 168 hours.   Chemistry Recent Labs  Lab 01/22/25 2355 01/23/25 0838 01/25/25 0521 01/26/25 0601 01/27/25 0540  NA 139   < > 138 139 138  K 5.2*   < > 4.7 5.0 4.7  CL 104   < > 103 105 104  CO2 21*   < > 23 27 25   GLUCOSE 131*   < > 93 112* 96  BUN 29*   < > 27* 25* 29*  CREATININE 1.66*   < > 1.67* 1.58* 1.49*  CALCIUM  9.6   < > 9.0 9.2 9.4  PROT 6.7  --   --   --   --   ALBUMIN 3.8  --   --   --   --   AST 20  --   --   --   --   ALT 15  --   --   --   --  ALKPHOS 102  --   --   --   --   BILITOT 0.4  --   --   --   --   GFRNONAA 39*   < > 39* 41* 44*  ANIONGAP 14   < > 11 7 8    < > = values in this interval not displayed.     Hematology Recent Labs  Lab 01/25/25 0521 01/26/25 0601 01/27/25 0540  WBC 4.3 4.9 3.4*  RBC 3.21* 3.25* 3.41*  HGB 9.9* 10.2* 10.5*  HCT 30.2* 30.2* 32.3*  MCV 94.1 92.9 94.7  MCH 30.8 31.4 30.8  MCHC 32.8 33.8 32.5  RDW 15.9* 15.5 15.6*  PLT 202 211 224    BNPNo results for input(s): BNP, PROBNP in the last 168 hours.   DDimer No results for input(s): DDIMER in the last 168 hours.   Radiology    No results found.  Cardiac Studies   2d echo 01/25/25   1. Left ventricular ejection fraction, by estimation, is 50 to 55%. The  left ventricle has low normal function. The left ventricle demonstrates  regional wall motion abnormalities (see scoring diagram/findings for  description). Left ventricular diastolic   parameters are consistent with Grade I diastolic dysfunction (impaired  relaxation).  The average left ventricular global longitudinal strain is  -17.7 %. The global longitudinal strain is normal.   2. Right ventricular systolic function is normal. The right ventricular  size is normal.   3. Left atrial size was mildly dilated.   4. The mitral valve is normal in structure. No evidence of mitral valve  regurgitation. No evidence of mitral stenosis.   5. The aortic valve is tricuspid. Aortic valve regurgitation is not  visualized. Aortic valve sclerosis/calcification is present, without any  evidence of aortic stenosis.   6. The inferior vena cava is normal in size with greater than 50%  respiratory variability, suggesting right atrial pressure of 3 mmHg   Patient Profile     89 y.o. 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 He had had nausea/vomiting the day prior, and was febrile in the ED. Found to have acute fx of left femur and question of colitis. hsTroponins mildly elevated/flat. S/p ORIF left femur 01/23/25. Cardiology consulted for abnormal WMA on echo.  Assessment & Plan    1. Fall with with pre-syncope vs syncope, preceded by nausea/vomiting the day before, and fever on presentation - left femur fx s/p repair - presenting BP 140s-150s - orthostatics not checked until 2/3 negative for drop in BP but rise in HR 71->90 - CTA neg for PE - BP reasonable last 24 hours - placed order for repeat orthostatic VS this AM - discussed monitor with Dr. Raford - will plan to place 14d live monitor today - patient is going to CIR (reviewed with Rico Dixons -> OK to place when going to CIR). Notified EKG team to come place  2. Wall motion abnormality, coronary calcification on CT - 2D echo with EF 50-55%, hypokinesis of the basal and  inferoseptal myocardium - WMA of uncertain significance, but not felt to be related to episode of pre-syncope vs syncope - extremely active without any exertional symptoms or heart failure symptoms - recommended for continued medical management - given Xarelto  for PE, not on  ASA - LDL 80, increase pravastatin  to 40mg  - can f/u lipids/ALT as OP - otherwise continue irbesartan , amlodipine   3. Elevated troponins - low/flat not felt to reflect ACS   4. Hx of PE - on Xarelto  20mg  daily - note dosing for VTE is different than AF dosing (no dose adjustment needed per literature review if CrCl>30) - defer long term plan for OAC to PCP as OP  5. Occasional PACs/PVCs - noted on tele, no sustained events - follow clinically and add Mg to labs - will be able to evaluate burden by monitor  Tentatively arranged f/u 02/26/25 with [facility] and outlined on AVS. (I had no availability in timeframe needed.)  For questions or updates, please contact [facility] Please consult [email or web address] for contact info under Cardiology/STEMI.  Signed, Raphael LOISE Bring, PA-C 01/28/2025, 9:22 AM    "

## 2025-01-28 NOTE — H&P (Signed)
 "   Physical Medicine and Rehabilitation Admission H&P    Chief Complaint  Patient presents with   Functional deficits due to L subcapital femur fx   HPI: Derrill Peri Raddle. 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 Per chart review the patient experienced an unwitnessed fall at home where he reported getting woozy headed leading him to fall over. He denied hitting his head but noted muscle weakness and was unable to get up and not sure if he had LOC. In the ER vitals signs were unremarkable, however an episode of vomiting and tachycardia were noted by EDP. Repeat EKG showed sinus tachycardia with HR of 139, no other acute findings.  Temp of 101.2 F with respirations 13-23, blood pressures elevated up to 168/92, and O2 saturations noted to be as low as 91% which he was placed on 2 L nasal cannula oxygen.  Labs: WBC 4.7, hemoglobin 10.4, K 5.2, BUN 26, creatinine 1.66, high-sensitivity troponin 48, and lactic acid 1.3.    CT scan of the head did not reveal any acute abnormality.  CT scan of the hip revealed acute transverse oblique subcapital proximal left femoral fracture with slight impaction, left inguinal hernia containing nonobstructed loop of small bowel, and new fiduciary markers in the prostate. He was found to have a closed subcapital fracture of left femur and underwent cannulated screw fixation of left neck fracture on 01/25/25 by Dr. Celena. DVT prophylaxis Lovenox. Xarelto  was resumed on 2/4.  Fever was felt likely colitis vs aspirational pneumonia (vomited after presyncopal event). Respiratory panel screening were negative. Urinalysis showed no significant signs for infection. Blood cultures were obtained showed no growth to date.   CT  angiogram of the chest, abdomen, pelvis was obtained noting no pulmonary embolism, mild bladder wall thickening versus underdistention, thickened wall versus underdistention of the ascending and proximal transverse colon possibly reflecting colitis, and bronchitis with scattered subsegmental lower lobe bronchial impactions without infiltrates. IV antibiotics Ceftriaxone  and Flagyl  completed on 2/5.   Presyncopal work up was done, 2D echo showed EF 50-55% and noted regional wall motion abnormality, grade 1 diastolic dysfunction. Cardiology was consulted and recommended medial management with live 14 day monitor and continuation of  Xarelto , Irbesartan  and Pravastatin  was increased to 40 mg daily. Orthostatics were reviewed with Dr. Raford, no plans to change medication regimen but an IV fluid bolus prior to transfer rehab of 250cc. Prior to admission the patient was active, golf's during warmer weather without use of DME. He resides with his spouse in a one level home with 3 steps to enter.  Therapies have been on going and he currently requires CGA to min assist with mobility and basic ADLs.   Pt reports a sore spot on L hand from falling- When turns the wrong way, pain like lightning- Norco helps this. Usually taking ~1x/day-  LBM today  Of note, Vit D level was 7.9 and  pt was c/o mild dizziness and lightheadedness intermittently prior to transfer and last few days with therapy.   Pt admits it's hard to maintain 50% PWB and just leans a lot on his arms and per son was told by someone other than surgeon, it's OK to put weight on broken leg.  So  he said they were confused because had heard differing ideas of what to do.   Review of Systems  Constitutional:  Positive for malaise/fatigue.  HENT:  Positive for hearing loss.   Eyes: Negative.   Respiratory: Negative.  Negative for cough and wheezing.   Cardiovascular:  Negative for chest pain and leg swelling.  Gastrointestinal:  Positive for  heartburn. Negative for constipation and nausea.  Genitourinary: Negative.  Negative for dysuria and frequency.  Musculoskeletal:  Positive for joint pain.  Skin: Negative.   Neurological:  Positive for dizziness and focal weakness.  Endo/Heme/Allergies: Negative.   Psychiatric/Behavioral: Negative.    All other systems reviewed and are negative.       Past Medical History:  Diagnosis Date   Aortic atherosclerosis    Chest pain    Chronic kidney disease (CKD), stage II (mild)    Coronary atherosclerosis    DM2 (diabetes mellitus, type 2) (HCC)    Elevated PSA    GERD (gastroesophageal reflux disease)    Hydronephrosis    on left   Hyperlipidemia    Hypertension    Iliac artery aneurysm    Kidney stone    Myasthenia gravis (HCC) 03/29/2017   Ocular myasthenia gravis (HCC)    Ophthalmoplegia 2009   Diplopia-intranuclear   Osteoarthritis    Pulmonary embolism (HCC)    Past Surgical History:  Procedure Laterality Date   GOLD SEED IMPLANT N/A 04/17/2024   Procedure: INSERTION, GOLD SEEDS;  Surgeon: Elisabeth Valli BIRCH, MD;  Location: [facility] OR;  Service: Urology;  Laterality: N/A;   KNEE SURGERY     LITHOTRIPSY     PROSTATE BIOPSY     PROSTATE SURGERY     SPACE OAR INSTILLATION N/A 04/17/2024   Procedure: INJECTION, HYDROGEL SPACER;  Surgeon: Elisabeth Valli BIRCH, MD;  Location: [facility] OR;  Service: Urology;  Laterality: N/A;   Family History  Problem Relation Age of Onset   Heart attack Father    Sarcoidosis Brother    Social History:  reports that he has quit smoking. His smoking use included cigarettes. He has quit using smokeless tobacco.  His smokeless tobacco use included chew. He reports that he does not drink alcohol  and does not use drugs. Allergies: Allergies[1] Medications Prior to Admission  Medication Sig Dispense Refill   albuterol  (VENTOLIN  HFA) 108 (90 Base) MCG/ACT inhaler Inhale 2 puffs into the lungs every 6 (six) hours as needed for wheezing.     amLODipine   (NORVASC ) 10 MG tablet Take 1 tablet (10 mg total) by mouth daily. 30 tablet 3   Ascorbic Acid (VITAMIN C PO) Take 1 tablet by mouth See admin instructions. Take one tablet by mouth with iron (ferrous sulfate) - occasionally     FERROUS SULFATE PO Take 1 tablet by mouth See admin instructions. Take one tablet by mouth with vitamin c - occasionally     hydrALAZINE  (APRESOLINE ) 10 MG tablet Take 10 mg by mouth as needed (only take as needed if bp is 170/100.).     [Paused] metFORMIN (GLUCOPHAGE-XR) 500 MG 24 hr tablet Take 500 mg by mouth in the morning and at bedtime.     ORGOVYX 120 MG tablet Take 120 mg by mouth daily.     pantoprazole  (PROTONIX ) 40 MG tablet Take 1 tablet (40 mg total) by mouth 2 (two) times daily before a meal. (Patient taking differently: Take 40 mg by mouth daily.) 60 tablet 0   polyethylene glycol (MIRALAX  / GLYCOLAX ) 17 g packet Take 17  g by mouth 2 (two) times daily. 14 each 0   potassium chloride (KLOR-CON) 10 MEQ tablet Take 10 mEq by mouth daily.     pravastatin  (PRAVACHOL ) 20 MG tablet Take 20 mg by mouth every morning.     telmisartan (MICARDIS) 20 MG tablet Take 20 mg by mouth at bedtime.     TRELEGY ELLIPTA 100-62.5-25 MCG/INH AEPB Inhale 1 puff into the lungs daily as needed (shortness of breath/wheezing).     XARELTO  20 MG TABS tablet Take 20 mg by mouth every morning.        Home: Home Living Family/patient expects to be discharged to:: Private residence Living Arrangements: Spouse/significant other Available Help at Discharge: Family Type of Home: House Home Access: Stairs to enter Secretary/administrator of Steps: 3 Entrance Stairs-Rails: Right Home Layout: One level Bathroom Shower/Tub: Health Visitor: Standard Bathroom Accessibility: Yes Home Equipment: Grab bars - tub/shower, Shower seat - built in, BSC/3in1  Lives With: Spouse   Functional History: Prior Function Prior Level of Function : Independent/Modified  Independent 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, semi retired ADLs Comments: Ind with ADL,iADL, drives and continues to work part time  Functional Status:  Mobility: Bed Mobility Overal bed mobility: Needs Assistance Bed Mobility: Supine to Sit Supine to sit: Supervision Sit to supine: Min assist General bed mobility comments: HOB elevated 16 degrees, increased time Transfers Overall transfer level: Needs assistance Equipment used: Rolling walker (2 wheels) Transfers: Sit to/from Stand Sit to Stand: Contact guard assist General transfer comment: Verbal cues for bringing LLE anteriorly prior to transition to standing or sitting Ambulation/Gait Ambulation/Gait assistance: Contact guard assist Gait Distance (Feet): 90 Feet Assistive device: Rolling walker (2 wheels) Gait Pattern/deviations: Step-to pattern General Gait Details: Slowed step to pattern. Verbal cues for walker use, weightbearing precautions, upward gaze Gait velocity: decreased Gait velocity interpretation: <1.31 ft/sec, indicative of household ambulator    ADL: ADL Grooming: Wash/dry hands, Wash/dry face, Set up, Sitting Lower Body Dressing: Moderate assistance, Sit to/from stand Toilet Transfer: Minimal assistance, Rolling walker (2 wheels), Regular Toilet, Ambulation  Cognition: Cognition Orientation Level: Oriented X4 Cognition Arousal: Alert Behavior During Therapy: WFL for tasks assessed/performed  Physical Exam: Blood pressure (!) 135/92, pulse 74, temperature 97.9 F (36.6 C), temperature source Oral, resp. rate 17, height 5' 11 (1.803 m), weight 79.4 kg, SpO2 97%. Physical Exam Vitals and nursing note reviewed. Exam conducted with a chaperone present.  Constitutional:      Appearance: Normal appearance. He is normal weight.     Comments: Pt awake, alert, appropriate, seen walking to bed with RW- is putting too much weight through LLE, educated pt multiple times; son at bedside, NAD   HENT:     Head: Normocephalic and atraumatic.     Right Ear: External ear normal.     Left Ear: External ear normal.     Nose: Nose normal. No congestion.     Mouth/Throat:     Mouth: Mucous membranes are dry.     Pharynx: No oropharyngeal exudate.  Eyes:     General:        Right eye: No discharge.        Left eye: No discharge.     Extraocular Movements: Extraocular movements intact.  Cardiovascular:     Rate and Rhythm: Normal rate and regular rhythm.     Heart sounds: Normal heart sounds. No murmur heard.    No gallop.  Pulmonary:     Effort: Pulmonary effort is  normal. No respiratory distress.     Breath sounds: Normal breath sounds. No wheezing, rhonchi or rales.  Abdominal:     General: Bowel sounds are normal. There is no distension.     Palpations: Abdomen is soft.     Tenderness: There is no abdominal tenderness.  Musculoskeletal:     Cervical back: Neck supple.     Comments: Ue's very strong 5/5 throughout RLE- 5/5 in all muscles tested LLE- at least 3+/5 in HF (limited by pain); KE/DF and PF 4/5 limited by ROM/pain  Skin:    General: Skin is warm and dry.     Comments: R forearm/wrist IV- looks OK Skin torn at base of 5th digit  ray on L hand when hit floor- very TTP surrounding that sore.  Mild edema/erythema in L lateral thigh- with dressing C/D/I  Neurological:     Mental Status: He is alert.     Comments: Ox4- alert and appropriate Intact to light touch in all 4 extremities  Psychiatric:        Mood and Affect: Mood normal.        Behavior: Behavior normal.     Results for orders placed or performed during the hospital encounter of 01/22/25 (from the past 48 hours)  CBC with Differential/Platelet     Status: Abnormal   Collection Time: 01/27/25  5:40 AM  Result Value Ref Range   WBC 3.4 (L) 4.0 - 10.5 K/uL   RBC 3.41 (L) 4.22 - 5.81 MIL/uL   Hemoglobin 10.5 (L) 13.0 - 17.0 g/dL   HCT 67.6 (L) 60.9 - 47.9 %   MCV 94.7 80.0 - 100.0 fL   MCH 30.8  26.0 - 34.0 pg   MCHC 32.5 30.0 - 36.0 g/dL   RDW 84.3 (H) 88.4 - 84.4 %   Platelets 224 150 - 400 K/uL   nRBC 0.0 0.0 - 0.2 %   Neutrophils Relative % 58 %   Neutro Abs 2.0 1.7 - 7.7 K/uL   Lymphocytes Relative 16 %   Lymphs Abs 0.6 (L) 0.7 - 4.0 K/uL   Monocytes Relative 14 %   Monocytes Absolute 0.5 0.1 - 1.0 K/uL   Eosinophils Relative 10 %   Eosinophils Absolute 0.4 0.0 - 0.5 K/uL   Basophils Relative 1 %   Basophils Absolute 0.0 0.0 - 0.1 K/uL   Immature Granulocytes 1 %   Abs Immature Granulocytes 0.02 0.00 - 0.07 K/uL    Comment: Performed at [facility], [address] [place], [place] [address]  Basic metabolic panel with GFR     Status: Abnormal   Collection Time: 01/27/25  5:40 AM  Result Value Ref Range   Sodium 138 135 - 145 mmol/L   Potassium 4.7 3.5 - 5.1 mmol/L   Chloride 104 98 - 111 mmol/L   CO2 25 22 - 32 mmol/L   Glucose, Bld 96 70 - 99 mg/dL    Comment: Glucose reference range applies only to samples taken after fasting for at least 8 hours.   BUN 29 (H) 8 - 23 mg/dL   Creatinine, Ser 8.50 (H) 0.61 - 1.24 mg/dL   Calcium  9.4 8.9 - 10.3 mg/dL   GFR, Estimated 44 (L) >60 mL/min    Comment: (NOTE) Calculated using the CKD-EPI Creatinine Equation (2021)    Anion gap 8 5 - 15    Comment: Performed at [facility], [address], [place], [place] [address]  Lipid panel  Status: None   Collection Time: 01/28/25  4:12 AM  Result Value Ref Range   Cholesterol 155 0 - 200 mg/dL    Comment:        ATP III CLASSIFICATION:  <200     mg/dL   Desirable  799-760  mg/dL   Borderline High  >=759    mg/dL   High           Triglycerides 65 <150 mg/dL   HDL 62 >59 mg/dL   Total CHOL/HDL Ratio 2.5 RATIO   VLDL 13 0 - 40 mg/dL   LDL Cholesterol 80 0 - 99 mg/dL    Comment:        Total Cholesterol/HDL:CHD Risk Coronary Heart Disease Risk Table                     Men   Women  1/2 Average Risk   3.4   3.3  Average Risk       5.0   4.4  2 X  Average Risk   9.6   7.1  3 X Average Risk  23.4   11.0        Use the calculated Patient Ratio above and the CHD Risk Table to determine the patient's CHD Risk.        ATP III CLASSIFICATION (LDL):  <100     mg/dL   Optimal  899-870  mg/dL   Near or Above                    Optimal  130-159  mg/dL   Borderline  839-810  mg/dL   High  >809     mg/dL   Very High Performed at [facility], [address] [place], [place] [address]   Magnesium     Status: None   Collection Time: 01/28/25  4:12 AM  Result Value Ref Range   Magnesium 1.9 1.7 - 2.4 mg/dL    Comment: Performed at [facility], [address], [place], [place] [address]  Glucose, capillary     Status: Abnormal   Collection Time: 01/28/25 11:46 AM  Result Value Ref Range   Glucose-Capillary 100 (H) 70 - 99 mg/dL    Comment: Glucose reference range applies only to samples taken after fasting for at least 8 hours.   No results found.    Blood pressure (!) 135/92, pulse 74, temperature 97.9 F (36.6 C), temperature source Oral, resp. rate 17, height 5' 11 (1.803 m), weight 79.4 kg, SpO2 97%.  Medical Problem List and Plan: 1. Functional deficits secondary to L subcapital closed femur fx s/p cannulated screw fixation- PWB 50% LLE  -patient may  shower  -ELOS/Goals: 5-7 days - mod I to supervision  Admit to CIR- appropriate  2.  Antithrombotics: -DVT/anticoagulation:  Xarelto  for PE  -antiplatelet therapy: n/a  3. Pain Management: Tylenol  and Vicodin prn   4. Mood/Behavior/Sleep: LCSW to follow for evaluation and support when available.   -antipsychotic agents: n/a  -sleep:   5. Neuropsych/cognition: This patient is capable of making decisions on his own behalf.  6. Skin/Wound Care: Routine pressure relief measures. (#8 Dressings)  7. Fluids/Electrolytes/Nutrition: Monitor I&O and weight. Follow up labs CBC/CMP     8. Left subcapital femur fracture: s/p cannulated screw fixation of  left femur  on 2/2 by Dr. Kendal. Follow up with ortho in 2 weeks for suture removal and follow up x-rays.   -PWB LLE with  walker (50% BW) no hip or knee restrictions.  -Continue dressing changes- Mepilex or 4x4 gauze and tape. Ice prn for swelling and local pain.   9. Fever: Last spike was on 1/31 without leukocytosis. Finished IV abx on 2/5 (Flagyl  and Rocephin )  -Monitor  vitals and CBC.   10. Presyncope with lightheadedness intermittently-  with wall motion abnormality: Cardiology recc's to continue Xarelto , Irbesartan  and Pravastatin  now 40 mg daily. Follow up outpatient with cardiology.  -14 day Zio patch planned while in CIR.    11.HTN: BP elevation noted likely due to pain.  -Monitor BP per protocol-Continue home med Amlodipine , Avapro  with Hydralazine  PRN.   12. T2DM: most recent A1c 6.1-repeat pending. Home med: Metformin. Glucose stable.   -Monitor glucose Nixa/hs with SSI Novolog     13. Chronic kidney disease stage IIIb: Baseline creatinine appears to range from 1.4-1.8   14. 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 Metabolic bone disease/ :fragility fracture/Vit D deficiency: Vit d level 7.9, Continue Vitamin D  supp started in hospital .    17. Prostate cancer: Outpatient follow-up.   18. Orthostatic hypotension- given 250cc bolus prior to rehab for dizziness and lightheadedness (documented as asymptomatic- per pt having mild Sx's)- will monitor- might need to reduce BP meds.        Daphne LOISE Satterfield, NP 01/28/2025   I have personally performed a face to face diagnostic evaluation of this patient and formulated the key components of the plan.  Additionally, I have personally reviewed laboratory data, imaging studies, as well as relevant notes and concur with the physician assistant's documentation above.   The patient's status has not changed from the original H&P.  Any changes in  documentation from the acute care chart have been noted above.       [1]  Allergies Allergen Reactions   Aspirin Other (See Comments)    REACTION: full strength; causes gi irritation   Nsaids     Other Reaction(s): Not available   "

## 2025-01-29 LAB — CBC WITH DIFFERENTIAL/PLATELET
Abs Immature Granulocytes: 0.03 10*3/uL (ref 0.00–0.07)
Basophils Absolute: 0 10*3/uL (ref 0.0–0.1)
Basophils Relative: 1 %
Eosinophils Absolute: 0.2 10*3/uL (ref 0.0–0.5)
Eosinophils Relative: 6 %
HCT: 31.8 % — ABNORMAL LOW (ref 39.0–52.0)
Hemoglobin: 10.3 g/dL — ABNORMAL LOW (ref 13.0–17.0)
Immature Granulocytes: 1 %
Lymphocytes Relative: 14 %
Lymphs Abs: 0.5 10*3/uL — ABNORMAL LOW (ref 0.7–4.0)
MCH: 30.9 pg (ref 26.0–34.0)
MCHC: 32.4 g/dL (ref 30.0–36.0)
MCV: 95.5 fL (ref 80.0–100.0)
Monocytes Absolute: 0.5 10*3/uL (ref 0.1–1.0)
Monocytes Relative: 13 %
Neutro Abs: 2.4 10*3/uL (ref 1.7–7.7)
Neutrophils Relative %: 65 %
Platelets: 253 10*3/uL (ref 150–400)
RBC: 3.33 MIL/uL — ABNORMAL LOW (ref 4.22–5.81)
RDW: 15.6 % — ABNORMAL HIGH (ref 11.5–15.5)
WBC: 3.6 10*3/uL — ABNORMAL LOW (ref 4.0–10.5)
nRBC: 0 % (ref 0.0–0.2)

## 2025-01-29 LAB — COMPREHENSIVE METABOLIC PANEL WITH GFR
ALT: 22 U/L (ref 0–44)
AST: 33 U/L (ref 15–41)
Albumin: 3.2 g/dL — ABNORMAL LOW (ref 3.5–5.0)
Alkaline Phosphatase: 79 U/L (ref 38–126)
Anion gap: 9 (ref 5–15)
BUN: 34 mg/dL — ABNORMAL HIGH (ref 8–23)
CO2: 24 mmol/L (ref 22–32)
Calcium: 9.6 mg/dL (ref 8.9–10.3)
Chloride: 105 mmol/L (ref 98–111)
Creatinine, Ser: 1.51 mg/dL — ABNORMAL HIGH (ref 0.61–1.24)
GFR, Estimated: 44 mL/min — ABNORMAL LOW
Glucose, Bld: 97 mg/dL (ref 70–99)
Potassium: 4.5 mmol/L (ref 3.5–5.1)
Sodium: 137 mmol/L (ref 135–145)
Total Bilirubin: <0.2 mg/dL (ref 0.0–1.2)
Total Protein: 6 g/dL — ABNORMAL LOW (ref 6.5–8.1)

## 2025-01-29 LAB — GLUCOSE, CAPILLARY
Glucose-Capillary: 95 mg/dL (ref 70–99)
Glucose-Capillary: 114 mg/dL — ABNORMAL HIGH (ref 70–99)
Glucose-Capillary: 156 mg/dL — ABNORMAL HIGH (ref 70–99)
Glucose-Capillary: 85 mg/dL (ref 70–99)

## 2025-01-29 NOTE — Progress Notes (Addendum)
>>   [name] [facility] 01/28/2025 15:19 Inpatient Rehabilitation Admission Medication Review by a Pharmacist  A complete drug regimen review was completed for this patient to identify any potential clinically significant medication issues.  High Risk Drug Classes Is patient taking? Indication by Medication  Antipsychotic Yes Compazine  - nausea and vomiting  Anticoagulant Yes Xarelto  - Hx of PE  Antibiotic No   Opioid Yes Norco - Pain  Antiplatelet No   Hypoglycemics/insulin  Yes Insulin  sliding scale - DM  Vasoactive Medication Yes Amlodipine , Avapro , hydralazine  - HTN  Chemotherapy No   Other Yes Colace, Miralax  - constipation Pravachol  - hyperlipidemia PPI - GERD Vit D and Zinc  - supplementation Trazadone - sleep     Type of Medication Issue Identified Description of Issue Recommendation(s)  Drug Interaction(s) (clinically significant)  None identified   Duplicate Therapy     Allergy  Aspirin, NSAIDS   No Medication Administration End Date     Incorrect Dose     Additional Drug Therapy Needed     Significant med changes from prior encounter (inform family/care partners about these prior to discharge). Iron sulfate - PTA Med Metformin - PTA Med Orgovyx - PTA Med KCL - PTA Med Telmisartan - switched to Avapro  Inpt Trelegy Elipta - PTA Med Iron sulfate - restart as indicated Metformin - restart as indicated Orgovyx - restart as indicated KCL - restart as indicated Telmisartan - Determine which ARB is appropriate on discharge and counsel to prevent taking duplicate therapy at home Trelegy Elipta - restart as indicated  Other       Clinically significant medication issues were identified that warrant physician communication and completion of prescribed/recommended actions by midnight of the next day:  No  Name of provider notified for urgent issues identified:   Provider Method of Notification:     Pharmacist comments:   Time spent performing this drug regimen  review (minutes):  30 mins   Donny Alert, PharmD, [facility] Clinical Pharmacist Please see AMION for all Pharmacists' Contact Phone Numbers 01/29/2025, 8:41 AM

## 2025-01-29 NOTE — Progress Notes (Signed)
 Inpatient Rehabilitation  Patient information reviewed and entered into eRehab system by [facility]. Karen Kays., CCC/SLP, PPS Coordinator.  Information including medical coding, functional ability and quality indicators will be reviewed and updated through discharge.

## 2025-01-29 NOTE — Plan of Care (Signed)
" °  Problem: Consults Goal: RH GENERAL PATIENT EDUCATION Description: See Patient Education module for education specifics. Outcome: Progressing Goal: Skin Care Protocol Initiated - if Braden Score 18 or less Description: If consults are not indicated, leave blank or document N/A Outcome: Progressing Goal: Nutrition Consult-if indicated Outcome: Progressing Goal: Diabetes Guidelines if Diabetic/Glucose > 140 Description: If diabetic or lab glucose is > 140 mg/dl - Initiate Diabetes/Hyperglycemia Guidelines & Document Interventions  Outcome: Progressing   Problem: RH BOWEL ELIMINATION Goal: RH STG MANAGE BOWEL WITH ASSISTANCE Description: STG Manage Bowel with mod I  Assistance. Outcome: Progressing Goal: RH STG MANAGE BOWEL W/MEDICATION W/ASSISTANCE Description: STG Manage Bowel with Medication with mod I Assistance. Outcome: Progressing   Problem: RH SAFETY Goal: RH STG ADHERE TO SAFETY PRECAUTIONS W/ASSISTANCE/DEVICE Description: STG Adhere to Safety Precautions With cues Assistance/Device. Outcome: Progressing   Problem: RH PAIN MANAGEMENT Goal: RH STG PAIN MANAGED AT OR BELOW PT'S PAIN GOAL Description: Pain < 4 with prns Outcome: Progressing   Problem: RH KNOWLEDGE DEFICIT GENERAL Goal: RH STG INCREASE KNOWLEDGE OF SELF CARE AFTER HOSPITALIZATION Description: Patient and spouse will be able to manage care at discharge using educational resources for medications and dietary modification independently Outcome: Progressing   "

## 2025-01-29 NOTE — Progress Notes (Signed)
 Inpatient Rehabilitation Center Individual Statement of Services  Patient Name:  [name].  Date:  01/29/2025  Welcome to the Inpatient Rehabilitation Center.  Our goal is to provide you with an individualized program based on your diagnosis and situation, designed to meet your specific needs.  With this comprehensive rehabilitation program, you will be expected to participate in at least 3 hours of rehabilitation therapies Monday-Friday, with modified therapy programming on the weekends.  Your rehabilitation program will include the following services:  Physical Therapy (PT), Occupational Therapy (OT), Speech Therapy (ST), 24 hour per day rehabilitation nursing, Therapeutic Recreaction (TR), Care Coordinator, Rehabilitation Medicine, Nutrition Services, and Pharmacy Services  Weekly team conferences will be held on Wednesday to discuss your progress.  Your Inpatient Rehabilitation Care Coordinator will talk with you frequently to get your input and to update you on team discussions.  Team conferences with you and your family in attendance may also be held.  Expected length of stay: 5-7 days   Overall anticipated outcome: Supervision  Depending on your progress and recovery, your program may change. Your Inpatient Rehabilitation Care Coordinator will coordinate services and will keep you informed of any changes. Your Inpatient Rehabilitation Care Coordinator's name and contact numbers are listed  below.  The following services may also be recommended but are not provided by the Inpatient Rehabilitation Center:  Driving Evaluations Home Health Rehabiltiation Services Outpatient Rehabilitation Services Vocational Rehabilitation   Arrangements will be made to provide these services after discharge if needed.  Arrangements include referral to agencies that provide these services.  Your insurance has been verified to be:  Medicare Part A and B  and AARP  Your primary doctor is: Shepard Ade, MD   Pertinent information will be shared with your doctor and your insurance company.  Inpatient Rehabilitation Care Coordinator:  Di'Asia Loreli SIERRAS [phone] or ELIGAH BRINKS  Information discussed with and copy given to patient by: Waverly Loreli, 01/29/2025, 12:04 PM

## 2025-01-29 NOTE — Progress Notes (Signed)
 " Inpatient Rehabilitation Care Coordinator Assessment and Plan Patient Details  Name: [name]. MRN: [id number] Date of Birth: [birth year]  Today's Date: 01/29/2025  Hospital Problems: Principal Problem:   Subcapital fracture of femur, left, closed, initial encounter [facility])  Past Medical History:  Past Medical History:  Diagnosis Date   Aortic atherosclerosis    Chest pain    Chronic kidney disease (CKD), stage II (mild)    Coronary atherosclerosis    DM2 (diabetes mellitus, type 2) (HCC)    Elevated PSA    GERD (gastroesophageal reflux disease)    Hydronephrosis    on left   Hyperlipidemia    Hypertension    Iliac artery aneurysm    Kidney stone    Myasthenia gravis (HCC) 03/29/2017   Ocular myasthenia gravis (HCC)    Ophthalmoplegia 2009   Diplopia-intranuclear   Osteoarthritis    Pulmonary embolism (HCC)    Past Surgical History:  Past Surgical History:  Procedure Laterality Date   GOLD SEED IMPLANT N/A 04/17/2024   Procedure: INSERTION, GOLD SEEDS;  Surgeon: Elisabeth Valli BIRCH, MD;  Location: [facility] OR;  Service: Urology;  Laterality: N/A;   KNEE SURGERY     LITHOTRIPSY     PROSTATE BIOPSY     PROSTATE SURGERY     SPACE OAR INSTILLATION N/A 04/17/2024   Procedure: INJECTION, HYDROGEL SPACER;  Surgeon: Elisabeth Valli BIRCH, MD;  Location: [facility] OR;  Service: Urology;  Laterality: N/A;   Social History:  reports that he has quit smoking. His smoking use included cigarettes. He has quit using smokeless tobacco.  His smokeless tobacco use included chew. He reports that he does not drink alcohol  and does not use drugs.  Family / Support Systems Marital Status: Married How Long?: Over 60 years Patient Roles: Spouse, Parent, Other (Comment) Spouse/Significant Other: [name] Children: [name] and [name] Anticipated Caregiver: [name] Ability/Limitations of Caregiver: Age Caregiver Availability: 24/7 Family Dynamics: Great family support  Social History Preferred language:  English Religion: Baptist Cultural Background: Christian Education: High school Health Literacy - How often do you need to have someone help you when you read instructions, pamphlets, or other written material from your doctor or pharmacy?: Never Writes: Yes 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, [employer] Return to Work Plans: N/a   Abuse/Neglect Abuse/Neglect Assessment Can Be Completed: Yes Physical Abuse: Denies Verbal Abuse: Denies Sexual Abuse: Denies Exploitation of patient/patient's resources: Denies Self-Neglect: Denies  Patient response to: Social Isolation - How often do you feel lonely or isolated from those around you?: Never  Emotional Status Pt's affect, behavior and adjustment status: Adjusting well to therapy Recent Psychosocial Issues: None Psychiatric History: None Substance Abuse History: None  Patient / Family Perceptions, Expectations & Goals Pt/Family understanding of illness & functional limitations: Patient/family understanding of illness & functional limitations Premorbid pt/family roles/activities: Active in the community Anticipated changes in roles/activities/participation: None 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, Trade [employer] Agencies: None Premorbid Home Care/DME Agencies: Other (Comment) (Grab bars - tub/shower, Shower seat - built in, BSC/3in1) Transportation available at discharge: Yes Is the patient able to respond to transportation needs?: Yes In the past 12 months, has lack of transportation kept you from medical appointments or from getting medications?: No In the past 12 months, has lack of transportation kept you from meetings, work, or from getting things needed for daily living?: No  Discharge Planning Living Arrangements: Spouse/significant other Support Systems: Spouse/significant other, Children, Church/faith community,  Friends/neighbors Type of  Residence: Private residence Insurance Resources: [employer], Media Planner (specify) BUILDING SERVICES ENGINEER) Financial Resources: Social Security Financial Screen Referred: Yes Living Expenses: Own Money Management: Patient, Spouse Does the patient have any problems obtaining your medications?: No Home Management: Patient/spouse manages own home Patient/Family Preliminary Plans: Plans to return home with wife, [name] and children providing assistance Care Coordinator Anticipated Follow Up Needs: HH/OP Expected length of stay: 5-7 days  Clinical Impression CSW met with patient/family to introduce herself and complete initial assessment. Patient presented to [facility] following Subcapital fracture of femur, left, closed, initial encounter. Patient is able to make needs known. His son [name] is present durign assessment and he reports he will share information with [name] when she arrives. Mr. Shorten is an independent individual who attends church with his wife only drives when necessary. [name] reports when his father returns home, himself and his sister will be there as extra support. They will also have the support from their church family and others as needed.   There were no further needs or concerns at present. CSW will follow up with family and continue to follow. Will provide patient/family with an update as soon as one becomes available.   Di'Asia  Loreli 01/29/2025, 1:15 PM    "

## 2025-01-29 NOTE — Plan of Care (Signed)
" °  Problem: RH SAFETY Goal: RH STG ADHERE TO SAFETY PRECAUTIONS W/ASSISTANCE/DEVICE Description: STG Adhere to Safety Precautions With cues Assistance/Device. Outcome: Progressing   Problem: RH PAIN MANAGEMENT Goal: RH STG PAIN MANAGED AT OR BELOW PT'S PAIN GOAL Description: Pain < 4 with prns Outcome: Progressing   Problem: RH KNOWLEDGE DEFICIT GENERAL Goal: RH STG INCREASE KNOWLEDGE OF SELF CARE AFTER HOSPITALIZATION Description: Patient and spouse will be able to manage care at discharge using educational resources for medications and dietary modification independently Outcome: Progressing   "

## 2025-01-29 NOTE — Progress Notes (Signed)
 Patient ID: Thomas Swindle., male   DOB: 08-18-1932, 89 y.o.   MRN: [id number]  Statement of service delivered.

## 2025-01-29 NOTE — Evaluation (Signed)
 Physical Therapy Assessment and Plan  Patient Details  Name: [name]. MRN: [id number] Date of Birth: [birth year]  PT Diagnosis: Abnormality of gait, Difficulty walking, and Pain in L hip w/ certain movements. Rehab Potential: Excellent ELOS: 7-10 days   Today's Date: 01/29/2025 PT Individual Time: 1000-1115 PT Individual Time Calculation (min): 75 min    Hospital Problem: Principal Problem:   Subcapital fracture of femur, left, closed, initial encounter [facility])   Past Medical History:  Past Medical History:  Diagnosis Date   Aortic atherosclerosis    Chest pain    Chronic kidney disease (CKD), stage II (mild)    Coronary atherosclerosis    DM2 (diabetes mellitus, type 2) (HCC)    Elevated PSA    GERD (gastroesophageal reflux disease)    Hydronephrosis    on left   Hyperlipidemia    Hypertension    Iliac artery aneurysm    Kidney stone    Myasthenia gravis (HCC) 03/29/2017   Ocular myasthenia gravis (HCC)    Ophthalmoplegia 2009   Diplopia-intranuclear   Osteoarthritis    Pulmonary embolism (HCC)    Past Surgical History:  Past Surgical History:  Procedure Laterality Date   GOLD SEED IMPLANT N/A 04/17/2024   Procedure: INSERTION, GOLD SEEDS;  Surgeon: Elisabeth Valli BIRCH, MD;  Location: [facility] OR;  Service: Urology;  Laterality: N/A;   KNEE SURGERY     LITHOTRIPSY     PROSTATE BIOPSY     PROSTATE SURGERY     SPACE OAR INSTILLATION N/A 04/17/2024   Procedure: INJECTION, HYDROGEL SPACER;  Surgeon: Elisabeth Valli BIRCH, MD;  Location: [facility] OR;  Service: Urology;  Laterality: N/A;    Assessment & Plan Clinical Impression:  [name]. 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 Per chart review the patient experienced  an unwitnessed fall at home where he reported getting woozy headed leading him to fall over. He denied hitting his head but noted muscle weakness and was unable to get up and not sure if he had LOC. In the ER vitals signs were unremarkable, however an episode of vomiting and tachycardia were noted by EDP. Repeat EKG showed sinus tachycardia with HR of 139, no other acute findings.  Temp of 101.2 F with respirations 13-23, blood pressures elevated up to 168/92, and O2 saturations noted to be as low as 91% which he was placed on 2 L nasal cannula oxygen.  Labs: WBC 4.7, hemoglobin 10.4, K 5.2, BUN 26, creatinine 1.66, high-sensitivity troponin 48, and lactic acid 1.3.     CT scan of the head did not reveal any acute abnormality.  CT scan of the hip revealed acute transverse oblique subcapital proximal left femoral fracture with slight impaction, left inguinal hernia containing nonobstructed loop of small bowel, and new fiduciary markers in the prostate. He was found to have a closed subcapital fracture of left femur and underwent cannulated screw fixation of left neck fracture on 01/25/25 by Dr. Celena. DVT prophylaxis Lovenox. Xarelto  was resumed on 2/4.  Fever was felt likely colitis vs aspirational pneumonia (vomited after presyncopal event). Respiratory panel screening were negative. Urinalysis showed no significant signs for infection. Blood cultures were obtained showed no growth to date.   CT angiogram of  the chest, abdomen, pelvis was obtained noting no pulmonary embolism, mild bladder wall thickening versus underdistention, thickened wall versus underdistention of the ascending and proximal transverse colon possibly reflecting colitis, and bronchitis with scattered subsegmental lower lobe bronchial impactions without infiltrates. IV antibiotics Ceftriaxone  and Flagyl  completed on 2/5.    Presyncopal work up was done, 2D echo showed EF 50-55% and noted regional wall motion abnormality, grade 1 diastolic  dysfunction. Cardiology was consulted and recommended medial management with live 14 day monitor and continuation of  Xarelto , Irbesartan  and Pravastatin  was increased to 40 mg daily. Orthostatics were reviewed with Dr. Raford, no plans to change medication regimen but an IV fluid bolus prior to transfer rehab of 250cc. Prior to admission the patient was active, golf's during warmer weather without use of DME. He resides with his spouse in a one level home with 3 steps to enter.  Therapies have been on going and he currently requires CGA to min assist with mobility and basic ADLs.    Pt reports a sore spot on L hand from falling- When turns the wrong way, pain like lightning- Norco helps this. Usually taking ~1x/day-  LBM today   Of note, Vit D level was 7.9 and  pt was c/o mild dizziness and lightheadedness intermittently prior to transfer and last few days with therapy.    Pt admits it's hard to maintain 50% PWB and just leans a lot on his arms and per son was told by someone other than surgeon, it's OK to put weight on broken leg.  So he said they were confused because had heard differing ideas of what to do.   Patient currently requires min with mobility secondary to muscle weakness.  Prior to hospitalization, patient was independent  with mobility and lived with Spouse in a House home.  Home access is 3Stairs to enter.  Patient will benefit from skilled PT intervention to maximize safe functional mobility, minimize fall risk, and decrease caregiver burden for planned discharge home with 24 hour supervision.  Anticipate patient will benefit from follow up HH at discharge.  PT - End of Session Activity Tolerance: Tolerates 30+ min activity with multiple rests Endurance Deficit: Yes PT Assessment Rehab Potential (ACUTE/IP ONLY): Excellent PT Barriers to Discharge: Inaccessible home environment;Weight bearing restrictions PT Patient demonstrates impairments in the following area(s):  Balance;Safety;Endurance;Motor PT Transfers Functional Problem(s): Bed Mobility;Bed to Chair;Car;Furniture PT Locomotion Functional Problem(s): Ambulation;Stairs;Wheelchair Mobility PT Plan PT Intensity: Minimum of 1-2 x/day ,45 to 90 minutes PT Frequency: 5 out of 7 days PT Duration Estimated Length of Stay: 7-10 days PT Treatment/Interventions: Ambulation/gait training;Community reintegration;Neuromuscular re-education;Stair training;UE/LE Strength taining/ROM;Wheelchair propulsion/positioning;UE/LE Coordination activities;Therapeutic Activities;Discharge planning;Balance/vestibular training;Functional mobility training;Patient/family education;Therapeutic Exercise PT Transfers Anticipated Outcome(s): Mod I PT Locomotion Anticipated Outcome(s): Mod I PT Recommendation Recommendations for Other Services: None Follow Up Recommendations: Home health PT Patient destination: Home Equipment Recommended: To be determined   PT Evaluation Precautions/Restrictions Precautions Precautions: Fall Recall of Precautions/Restrictions: Intact Precaution/Restrictions Comments: No ROM restrictions L hip/knee- 50% Wb LLE Restrictions Weight Bearing Restrictions Per Provider Order: Yes LLE Weight Bearing Per Provider Order: Partial weight bearing LLE Partial Weight Bearing Percentage or Pounds: 50% General PT Missed Treatment Reason: Not applicable Vital Signs  Pain Pain Assessment Pain Scale: 0-10 Pain Score: 6  (w/ activity, 0/10 w/o.) Pain Type: Surgical pain;Acute pain Pain Location: Hip Pain Orientation: Left Pain Descriptors / Indicators: Sharp Pain Interference Pain Interference Pain Effect on Sleep: 1. Rarely or not at all Pain Interference with Therapy  Activities: 1. Rarely or not at all Pain Interference with Day-to-Day Activities: 1. Rarely or not at all Home Living/Prior Functioning Home Living Available Help at Discharge: Family Type of Home: House Home Access: Stairs to  enter [employer] of Steps: 3 Entrance Stairs-Rails: Right Home Layout: One level Bathroom Shower/Tub: Walk-in shower;Tub/shower unit Bathroom Toilet: Standard Bathroom Accessibility: Yes Additional Comments: hand rail and seat  Lives With: Spouse Prior Function Level of Independence: Independent with transfers;Independent with gait  Able to Take Stairs?: Yes Driving: Yes Vocation: Part time employment Vision/Perception  Vision - History Ability to See in Adequate Light: 1 Impaired Perception Perception: Within Functional Limits Praxis Praxis: WFL  Cognition Overall Cognitive Status: Within Functional Limits for tasks assessed Arousal/Alertness: Awake/alert Memory: Appears intact Awareness: Appears intact Problem Solving: Appears intact Safety/Judgment: Appears intact Sensation Sensation Light Touch: Appears Intact Coordination Gross Motor Movements are Fluid and Coordinated: No Finger Nose Finger Test: [facility] Heel Shin Test: [facility] Motor  Motor Motor: Within Functional Limits   Trunk/Postural Assessment  Cervical Assessment Cervical Assessment: Exceptions to [facility] (forward head) Thoracic Assessment Thoracic Assessment: Exceptions to [facility] (rounded shoulders) Lumbar Assessment Lumbar Assessment: Exceptions to [facility] (posterior pelvic tilt.) Postural Control Postural Control: Within Functional Limits  Balance Balance Balance Assessed: Yes Dynamic Sitting Balance Dynamic Sitting - Balance Support: During functional activity;Feet supported Dynamic Sitting - Level of Assistance: 5: Stand by assistance Static Standing Balance Static Standing - Balance Support: Bilateral upper extremity supported;During functional activity Static Standing - Level of Assistance: 5: Stand by assistance Dynamic Standing Balance Dynamic Standing - Balance Support: During functional activity;Bilateral upper extremity supported Dynamic Standing - Level of Assistance: 4: Min  assist Extremity Assessment  RUE Assessment RUE Assessment: Within Functional Limits LUE Assessment LUE Assessment: Within Functional Limits RLE Assessment RLE Assessment: Within Functional Limits LLE Assessment General Strength Comments: grossly 3+/5 hip otherwise 5/5.  Care Tool Care Tool Bed Mobility Roll left and right activity   Roll left and right assist level: Supervision/Verbal cueing    Sit to lying activity   Sit to lying assist level: Supervision/Verbal cueing    Lying to sitting on side of bed activity   Lying to sitting on side of bed assist level: the ability to move from lying on the back to sitting on the side of the bed with no back support.: Supervision/Verbal cueing     Care Tool Transfers Sit to stand transfer   Sit to stand assist level: Contact Guard/Touching assist    Chair/bed transfer   Chair/bed transfer assist level: Contact Guard/Touching assist    Car transfer   Car transfer assist level: Moderate Assistance - Patient 50 - 74%      Care Tool Locomotion Ambulation   Assist level: Minimal Assistance - Patient > 75% Assistive device: Walker-rolling Max distance: 118  Walk 10 feet activity   Assist level: Minimal Assistance - Patient > 75% Assistive device: Walker-rolling   Walk 50 feet with 2 turns activity   Assist level: Minimal Assistance - Patient > 75% Assistive device: Walker-rolling  Walk 150 feet activity Walk 150 feet activity did not occur: Safety/medical concerns      Walk 10 feet on uneven surfaces activity   Assist level: Minimal Assistance - Patient > 75% Assistive device: Walker-rolling  Stairs   Assist level: Moderate Assistance - Patient - 50 - 74% Stairs assistive device: 1 hand rail (w/ B hands) Max number of stairs: 3  Walk up/down 1 step activity   Walk up/down 1  step (curb) assist level: Moderate Assistance - Patient - 50 - 74% Walk up/down 1 step or curb assistive device: 1 hand rail (w/ B hands)  Walk up/down  4 steps activity Walk up/down 4 steps activity did not occur: Safety/medical concerns      Walk up/down 12 steps activity Walk up/down 12 steps activity did not occur: Safety/medical concerns      Pick up small objects from floor Pick up small object from the floor (from standing position) activity did not occur: Safety/medical concerns      Wheelchair Is the patient using a wheelchair?: Yes Type of Wheelchair: Manual   Wheelchair assist level: Supervision/Verbal cueing Max wheelchair distance: 100  Wheel 50 feet with 2 turns activity   Assist Level: Supervision/Verbal cueing  Wheel 150 feet activity   Assist Level: Minimal Assistance - Patient > 75%    Refer to Care Plan for Long Term Goals  SHORT TERM GOAL WEEK 1 PT Short Term Goal 1 (Week 1): STG = LTG 2/2 ELOS  Recommendations for other services: None   Skilled Therapeutic Intervention Evaluation completed (see details above and below) with education on PT POC and goals and individual treatment initiated with focus on  strengthening, gait, stairs, WB restrictions.  Pt presents sitting in w/c and agreeable to therapy.  Pt transfers sit to stand throughout session w/ min/CGA and cues for maintaining PWB LLE.  Pt transfers to bed w/ RW and min A.  Pt able to transfer sit <> sup w/ supervision.  Pt required mod A for LLE to transfer in/out of SUV/truck height simulation.  Pt amb x 118' w/ RW and min/CGA but cues for sequencing to maintain PWB.  PT demo'd tub seat stair transfer, but performed up/down using B hands on R rail w/ cues for hand placement to maintain PWB.  Son states will put rail on L which will help for WB.  Pt remained sitting in w/c w/ chair alarm on and son present for all needs.     Mobility Bed Mobility Bed Mobility: Rolling Right;Sit to Supine;Supine to Sit Rolling Right: Supervision/verbal cueing Supine to Sit: Supervision/Verbal cueing (from almost fllat HOB, pt has bed w/ controls.) Sit to Supine:  Supervision/Verbal cueing (from almost fllat HOB, pt has bed w/ controls.) Transfers Transfers: Sit to Stand;Stand to Sit;Stand Pivot Transfers Sit to Stand: Contact Guard/Touching assist;Minimal Assistance - Patient > 75% Stand to Sit: Contact Guard/Touching assist;Independent with assistive device Stand Pivot Transfers: Minimal Assistance - Patient > 75%;Moderate Assistance - Patient 50 - 74% Stand Pivot Transfer Details: Verbal cues for precautions/safety;Verbal cues for safe use of DME/AE Transfer (Assistive device): Rolling walker Locomotion  Gait Ambulation: Yes Gait Assistance: Minimal Assistance - Patient > 75%;Contact Guard/Touching assist Gait Distance (Feet): 118 Feet Assistive device: Rolling walker Gait Assistance Details: Verbal cues for gait pattern;Verbal cues for safe use of DME/AE;Verbal cues for precautions/safety Gait Assistance Details: cueing for maintaining PWB LLE. Gait Gait: Yes Gait Pattern: Step-through pattern;Decreased step length - right;Step-to pattern Gait velocity: decreased Stairs / Additional Locomotion Stairs: Yes Stairs Assistance: Moderate Assistance - Patient 50 - 74% Stair Management Technique: One rail Right (B hands on R rail, cueing for PWB LLE.  Son present and may add L rail to entrance of home for use of tub seat (demo'd) or use of 1 rail w/ B hands or use of 2 rails.) Number of Stairs: 3 Height of Stairs: 6 Ramp: Minimal Assistance - Patient >75% Curb: Moderate Assistance - Patient 50 -  74% Corporate Treasurer: Yes Wheelchair Assistance: Doctor, General Practice: Both upper extremities Wheelchair Parts Management: Needs assistance Distance: 100   Discharge Criteria: Patient will be discharged from PT if patient refuses treatment 3 consecutive times without medical reason, if treatment goals not met, if there is a change in medical status, if patient makes no progress towards goals or if  patient is discharged from hospital.  The above assessment, treatment plan, treatment alternatives and goals were discussed and mutually agreed upon: by patient and by family  Reyes SHAUNNA Sierra 01/29/2025, 12:15 PM

## 2025-01-29 NOTE — Plan of Care (Signed)
" °  Problem: RH Balance Goal: LTG: Patient will maintain dynamic sitting balance (OT) Description: LTG:  Patient will maintain dynamic sitting balance with assistance during activities of daily living (OT) Flowsheets (Taken 01/29/2025 1335) LTG: Pt will maintain dynamic sitting balance during ADLs with: Independent with assistive device Goal: LTG Patient will maintain dynamic standing with ADLs (OT) Description: LTG:  Patient will maintain dynamic standing balance with assist during activities of daily living (OT)  Flowsheets (Taken 01/29/2025 1335) LTG: Pt will maintain dynamic standing balance during ADLs with: Independent with assistive device   Problem: Sit to Stand Goal: LTG:  Patient will perform sit to stand in prep for activites of daily living with assistance level (OT) Description: LTG:  Patient will perform sit to stand in prep for activites of daily living with assistance level (OT) Flowsheets (Taken 01/29/2025 1335) LTG: PT will perform sit to stand in prep for activites of daily living with assistance level: Independent with assistive device   Problem: RH Bathing Goal: LTG Patient will bathe all body parts with assist levels (OT) Description: LTG: Patient will bathe all body parts with assist levels (OT) Flowsheets (Taken 01/29/2025 1335) LTG: Pt will perform bathing with assistance level/cueing: Independent with assistive device    Problem: RH Dressing Goal: LTG Patient will perform upper body dressing (OT) Description: LTG Patient will perform upper body dressing with assist, with/without cues (OT). Flowsheets (Taken 01/29/2025 1335) LTG: Pt will perform upper body dressing with assistance level of: Independent with assistive device Goal: LTG Patient will perform lower body dressing w/assist (OT) Description: LTG: Patient will perform lower body dressing with assist, with/without cues in positioning using equipment (OT) Flowsheets (Taken 01/29/2025 1335) LTG: Pt will perform lower  body dressing with assistance level of: Independent with assistive device   Problem: RH Toileting Goal: LTG Patient will perform toileting task (3/3 steps) with assistance level (OT) Description: LTG: Patient will perform toileting task (3/3 steps) with assistance level (OT)  Flowsheets (Taken 01/29/2025 1335) LTG: Pt will perform toileting task (3/3 steps) with assistance level: Independent with assistive device   Problem: RH Toilet Transfers Goal: LTG Patient will perform toilet transfers w/assist (OT) Description: LTG: Patient will perform toilet transfers with assist, with/without cues using equipment (OT) Flowsheets (Taken 01/29/2025 1335) LTG: Pt will perform toilet transfers with assistance level of: Independent with assistive device   Problem: RH Tub/Shower Transfers Goal: LTG Patient will perform tub/shower transfers w/assist (OT) Description: LTG: Patient will perform tub/shower transfers with assist, with/without cues using equipment (OT) Flowsheets (Taken 01/29/2025 1335) LTG: Pt will perform tub/shower stall transfers with assistance level of: Independent with assistive device   "

## 2025-01-29 NOTE — Progress Notes (Signed)
 Occupational Therapy Session Note  Patient Details  Name: [name]. MRN: [id number] Date of Birth: 1932-10-07  Today's Date: 01/29/2025 OT Individual Time: 1345-1500 OT Individual Time Calculation (min): 75 min    Short Term Goals: Week 1:  OT Short Term Goal 1 (Week 1): STG=LTG d/t ELOS  Skilled Therapeutic Interventions/Progress Updates:   Patient agreeable to participate in OT session. Reports 0/10 pain level. Patient participated in skilled OT session focusing on toileting, family education, functional mobility UE strengthening. Patient received in room ready for skilled OT. Patient completed toileting/ toilet transfer overall CGA for safety. Patient able to complete handwashing standing at sink . Patient then completed functional mobility to gym. Patient family educated due to MD reported orthostatics. Vitals taken, MD ordered ted hose for patient. OT donned and educated. Patient spouse educated on questions asked, verbalized understanding . Patient completed UE strengthening with Sharie Amorin theraband horizontal abduction, tricep extension. Dowel rod chest press 3x20. Patient then returned to room all needs in reach alarm on.    Therapy Documentation Precautions:  Precautions Precautions: Fall Recall of Precautions/Restrictions: Intact Precaution/Restrictions Comments: No ROM restrictions L hip/knee- 50% Wb LLE Restrictions Weight Bearing Restrictions Per Provider Order: Yes LLE Weight Bearing Per Provider Order: Partial weight bearing LLE Partial Weight Bearing Percentage or Pounds: 50%  Therapy/Group: Individual Therapy  D'mariea L Seva Chancy 01/29/2025, 4:08 PM

## 2025-01-29 NOTE — Progress Notes (Addendum)
 "                                                        PROGRESS NOTE   Subjective/Complaints: No new complaints or concerns this morning.  Reports he had a bowel movement earlier today.  Reports pain is under control.  Reports his appetite is okay.  Patient says he slept well last night.  ROS: Patient denies fever, rash, sore throat, blurred vision, dizziness, nausea, vomiting, diarrhea, cough, shortness of breath or chest pain, headache, or mood change.    Objective:   No results found. Recent Labs    01/27/25 0540 01/29/25 0622  WBC 3.4* 3.6*  HGB 10.5* 10.3*  HCT 32.3* 31.8*  PLT 224 253   Recent Labs    01/27/25 0540 01/29/25 0622  NA 138 137  K 4.7 4.5  CL 104 105  CO2 25 24  GLUCOSE 96 97  BUN 29* 34*  CREATININE 1.49* 1.51*  CALCIUM  9.4 9.6    Intake/Output Summary (Last 24 hours) at 01/29/2025 1410 Last data filed at 01/29/2025 0800 Gross per 24 hour  Intake 118 ml  Output 300 ml  Net -182 ml        Physical Exam: Vital Signs Blood pressure 132/70, pulse 66, temperature 98.5 F (36.9 C), temperature source Oral, resp. rate 17, height 5' 11 (1.803 m), weight 79.4 kg, SpO2 96%.  General: No apparent distress, sitting in wheelchair working with OT HEENT: Head is normocephalic, atraumatic, sclera anicteric, oral mucosa a little dry Heart: Reg rate and rhythm. No murmurs rubs or gallops Chest: CTA bilaterally without wheezes, rales, or rhonchi; no distress Abdomen: Soft, non-tender, non-distended, bowel sounds positive. Extremities: No clubbing, cyanosis, or edema. Pulses are 2+ Psych: Pt's affect is appropriate. Pt is cooperative Skin: Skin torn at base of 5th digit ray on L hand  Mild edema/erythema in L lateral thigh- with dressing C/D/I  Neuro: Alert and awake, follows commands Bilateral upper extremities 5 out of 5 strength Right lower extremity 5 out of 5 strength Left lower extremity hip flexion 3 to 4- out of 5, distally 4/5 SENSORY: Normal  to touch all 4 extremities    Assessment/Plan: 1. Functional deficits which require 3+ hours per day of interdisciplinary therapy in a comprehensive inpatient rehab setting. Physiatrist is providing close team supervision and 24 hour management of active medical problems listed below. Physiatrist and rehab team continue to assess barriers to discharge/monitor patient progress toward functional and medical goals  Care Tool:  Bathing    Body parts bathed by patient: Right arm, Left upper leg, Left arm, Chest, Abdomen, Front perineal area, Face, Buttocks, Right upper leg, Right lower leg, Left lower leg   Body parts bathed by helper: Right lower leg, Left lower leg     Bathing assist Assist Level: Minimal Assistance - Patient > 75%     Upper Body Dressing/Undressing Upper body dressing   What is the patient wearing?: Pull over shirt    Upper body assist Assist Level: Set up assist    Lower Body Dressing/Undressing Lower body dressing      What is the patient wearing?: Underwear/pull up, Pants     Lower body assist Assist for lower body dressing: Minimal Assistance - Patient > 75%     Toileting Toileting  Toileting assist Assist for toileting: Minimal Assistance - Patient > 75%     Transfers Chair/bed transfer  Transfers assist     Chair/bed transfer assist level: Contact Guard/Touching assist     Locomotion Ambulation   Ambulation assist      Assist level: Minimal Assistance - Patient > 75% Assistive device: Walker-rolling Max distance: 118   Walk 10 feet activity   Assist     Assist level: Minimal Assistance - Patient > 75% Assistive device: Walker-rolling   Walk 50 feet activity   Assist    Assist level: Minimal Assistance - Patient > 75% Assistive device: Walker-rolling    Walk 150 feet activity   Assist Walk 150 feet activity did not occur: Safety/medical concerns         Walk 10 feet on uneven surface   activity   Assist     Assist level: Minimal Assistance - Patient > 75% Assistive device: Walker-rolling   Wheelchair     Assist Is the patient using a wheelchair?: Yes Type of Wheelchair: Manual    Wheelchair assist level: Supervision/Verbal cueing Max wheelchair distance: 100    Wheelchair 50 feet with 2 turns activity    Assist        Assist Level: Supervision/Verbal cueing   Wheelchair 150 feet activity     Assist      Assist Level: Minimal Assistance - Patient > 75%   Blood pressure 132/70, pulse 66, temperature 98.5 F (36.9 C), temperature source Oral, resp. rate 17, height 5' 11 (1.803 m), weight 79.4 kg, SpO2 96%.  Medical Problem List and Plan: 1. Functional deficits secondary to L subcapital closed femur fx s/p cannulated screw fixation- PWB 50% LLE             -patient may  shower             -ELOS/Goals: 5-7 days - mod I to supervision             -Continue CIR   2.  Antithrombotics: -DVT/anticoagulation:  Xarelto  for PE             -antiplatelet therapy: n/a   3. Pain Management: Tylenol  and Vicodin prn    4. Mood/Behavior/Sleep: LCSW to follow for evaluation and support when available.              -antipsychotic agents: n/a             -sleep: Trazodone  as needed  5. Neuropsych/cognition: This patient is capable of making decisions on his own behalf.   6. Skin/Wound Care: Routine pressure relief measures. (#8 Dressings)   7. Fluids/Electrolytes/Nutrition: Monitor I&O and weight. Follow up labs CBC/CMP     8. Left subcapital femur fracture: s/p cannulated screw fixation of  left femur on 2/2 by Dr. Kendal. Follow up with ortho in 2 weeks for suture removal and follow up x-rays.              -PWB LLE with walker (50% BW) no hip or knee restrictions.             -Continue dressing changes- Mepilex or 4x4 gauze and tape. Ice prn for swelling and local pain.    9. Fever: Last spike was on 1/31 without leukocytosis. Finished IV abx  on 2/5 (Flagyl  and Rocephin )  -Monitor  vitals and CBC.   2/6 remains afebrile, WBC 3.6 today   10. Presyncope with lightheadedness intermittently-  with wall motion abnormality: Cardiology recc's to continue Xarelto ,  Irbesartan  and Pravastatin  now 40 mg daily. Follow up outpatient with cardiology.  -14 day Zio patch planned while in CIR.     11.HTN: BP elevation noted likely due to pain.  -Monitor BP per protocol-Continue home med Amlodipine , Avapro  with Hydralazine  PRN.  -2/6 BP stable continue current regimen    01/29/2025    6:27 AM 01/28/2025    9:34 PM 01/28/2025    3:31 PM  Vitals with BMI  Height   5' 11  Weight   175 lbs  BMI   24.42  Systolic 132 142 867  Diastolic 70 68 62  Pulse 66 73 63      12. T2DM: most recent A1c 6.1-repeat pending. Home med: Metformin. Glucose stable.   -Monitor glucose [place] with SSI Novolog    -2/6 CBG stable continue to monitor CBG (last 3)  Recent Labs    01/28/25 2131 01/29/25 0624 01/29/25 1124  GLUCAP 132* 95 114*      13. Chronic kidney disease stage IIIb: Baseline creatinine appears to range from 1.4-1.8  -2/6 creatinine overall stable 1.51, BUN a little higher at 34.  Encourage oral fluids   14. 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 Metabolic bone disease/ :fragility fracture/Vit D deficiency: Vit d level 7.9, Continue Vitamin D  supp started in hospital .     17. Prostate cancer: Outpatient follow-up.    18. Orthostatic hypotension- given 250cc bolus prior to rehab for dizziness and lightheadedness (documented as asymptomatic- per pt having mild Sx's)- will monitor- might need to reduce BP meds.   - 2/6 baseline BP stable.  Will ask therapy team and nursing to monitor for symptoms  Addendum BP 134 sitting, 112/66 standing but asymptomatic.  Will order TED hose, encourage fluids     19. Ocular myasthenic gravis- per Neurology- but if  becomes symptomatic, will need to call Neuro consult.       LOS: 1 days A FACE TO FACE EVALUATION WAS PERFORMED  Murray Collier 01/29/2025, 2:10 PM     "

## 2025-01-29 NOTE — Evaluation (Signed)
 Occupational Therapy Assessment and Plan  Patient Details  Name: [name]. MRN: [id number] Date of Birth: [birth year]  OT Diagnosis: acute pain, muscle weakness (generalized), and pain in joint Rehab Potential: Rehab Potential (ACUTE ONLY): Good ELOS: 7-10 days   Today's Date: 01/29/2025 OT Individual Time: 9182-9069 OT Individual Time Calculation (min): 73 min     Hospital Problem: Principal Problem:   Subcapital fracture of femur, left, closed, initial encounter [facility])   Past Medical History:  Past Medical History:  Diagnosis Date   Aortic atherosclerosis    Chest pain    Chronic kidney disease (CKD), stage II (mild)    Coronary atherosclerosis    DM2 (diabetes mellitus, type 2) (HCC)    Elevated PSA    GERD (gastroesophageal reflux disease)    Hydronephrosis    on left   Hyperlipidemia    Hypertension    Iliac artery aneurysm    Kidney stone    Myasthenia gravis (HCC) 03/29/2017   Ocular myasthenia gravis (HCC)    Ophthalmoplegia 2009   Diplopia-intranuclear   Osteoarthritis    Pulmonary embolism (HCC)    Past Surgical History:  Past Surgical History:  Procedure Laterality Date   GOLD SEED IMPLANT N/A 04/17/2024   Procedure: INSERTION, GOLD SEEDS;  Surgeon: Elisabeth Valli BIRCH, MD;  Location: [facility] OR;  Service: Urology;  Laterality: N/A;   KNEE SURGERY     LITHOTRIPSY     PROSTATE BIOPSY     PROSTATE SURGERY     SPACE OAR INSTILLATION N/A 04/17/2024   Procedure: INJECTION, HYDROGEL SPACER;  Surgeon: Elisabeth Valli BIRCH, MD;  Location: [facility] OR;  Service: Urology;  Laterality: N/A;    Assessment & Plan Clinical Impression: Patient is a 89 y.o. 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 Per chart review the patient experienced an  unwitnessed fall at home where he reported getting woozy headed leading him to fall over. He denied hitting his head but noted muscle weakness and was unable to get up and not sure if he had LOC. In the ER vitals signs were unremarkable, however an episode of vomiting and tachycardia were noted by EDP. Repeat EKG showed sinus tachycardia with HR of 139, no other acute findings.  Temp of 101.2 F with respirations 13-23, blood pressures elevated up to 168/92, and O2 saturations noted to be as low as 91% which he was placed on 2 L nasal cannula oxygen.  Labs: WBC 4.7, hemoglobin 10.4, K 5.2, BUN 26, creatinine 1.66, high-sensitivity troponin 48, and lactic acid 1.3.     CT scan of the head did not reveal any acute abnormality.  CT scan of the hip revealed acute transverse oblique subcapital proximal left femoral fracture with slight impaction, left inguinal hernia containing nonobstructed loop of small bowel, and new fiduciary markers in the prostate. He was found to have a closed subcapital fracture of left femur and underwent cannulated screw fixation of left neck fracture on 01/25/25 by Dr. Celena. DVT prophylaxis Lovenox. Xarelto  was resumed on 2/4.  Fever was felt likely colitis vs aspirational pneumonia (vomited after presyncopal event). Respiratory panel screening were negative. Urinalysis showed no significant signs for infection. Blood cultures were obtained showed no growth to date.   CT angiogram of the chest, abdomen,  pelvis was obtained noting no pulmonary embolism, mild bladder wall thickening versus underdistention, thickened wall versus underdistention of the ascending and proximal transverse colon possibly reflecting colitis, and bronchitis with scattered subsegmental lower lobe bronchial impactions without infiltrates. IV antibiotics Ceftriaxone  and Flagyl  completed on 2/5.    Presyncopal work up was done, 2D echo showed EF 50-55% and noted regional wall motion abnormality, grade 1 diastolic  dysfunction. Cardiology was consulted and recommended medial management with live 14 day monitor and continuation of  Xarelto , Irbesartan  and Pravastatin  was increased to 40 mg daily. Orthostatics were reviewed with Dr. Raford, no plans to change medication regimen but an IV fluid bolus prior to transfer rehab of 250cc. Prior to admission the patient was active, golf's during warmer weather without use of DME. He resides with his spouse in a one level home with 3 steps to enter.  Therapies have been on going and he currently requires CGA to min assist with mobility and basic ADLs.    Pt reports a sore spot on L hand from falling- When turns the wrong way, pain like lightning- Norco helps this. Usually taking ~1x/day-  LBM today   Of note, Vit D level was 7.9 and  pt was c/o mild dizziness and lightheadedness intermittently prior to transfer and last few days with therapy.    Pt admits it's hard to maintain 50% PWB and just leans a lot on his arms and per son was told by someone other than surgeon, it's OK to put weight on broken leg.  So he said they were confused   Patient currently requires min with basic self-care skills secondary to muscle weakness and muscle joint tightness, decreased cardiorespiratoy endurance, and decreased standing balance, decreased balance strategies, and difficulty maintaining precautions.  Prior to hospitalization, patient could complete ALL ADLS, IADLS, driving with independent .  Patient will benefit from skilled intervention to decrease level of assist with basic self-care skills and increase independence with basic self-care skills prior to discharge home with care partner.  Anticipate patient will require 24 hour supervision and no further OT follow recommended.  OT - End of Session Activity Tolerance: Tolerates 30+ min activity with multiple rests Endurance Deficit: Yes OT Assessment Rehab Potential (ACUTE ONLY): Good OT Barriers to Discharge: Home  environment access/layout;Weight bearing restrictions OT Barriers to Discharge Comments: wb 50% with fair compliance OT Patient demonstrates impairments in the following area(s): Balance;Skin Integrity;Cognition;Endurance;Motor;Pain;Safety OT Basic ADL's Functional Problem(s): Bathing;Dressing;Toileting OT Transfers Functional Problem(s): Toilet OT Additional Impairment(s): Fuctional Use of Upper Extremity OT Plan OT Intensity: Minimum of 1-2 x/day, 45 to 90 minutes OT Frequency: 5 out of 7 days OT Duration/Estimated Length of Stay: 7-10 days OT Treatment/Interventions: Balance/vestibular training;Disease mangement/prevention;Neuromuscular re-education;Therapeutic Exercise;Self Care/advanced ADL retraining;Wheelchair propulsion/positioning;UE/LE Strength taining/ROM;Pain management;DME/adaptive equipment instruction;Community reintegration;Patient/family education;Splinting/orthotics;UE/LE Coordination activities;Psychosocial support;Therapeutic Activities;Discharge planning;Functional mobility training OT Self Feeding Anticipated Outcome(s): mod I OT Basic Self-Care Anticipated Outcome(s): mod I to SUP OT Toileting Anticipated Outcome(s): mod I to sup OT Bathroom Transfers Anticipated Outcome(s): sup OT Recommendation Recommendations for Other Services: None Patient destination: Home Follow Up Recommendations: Other (comment) (TO BE DETERMINED.) Equipment Recommended: To be determined   OT Evaluation Precautions/Restrictions  Precautions Precautions: Fall Precaution/Restrictions Comments: No ROM restrictions L hip/knee- 50% Wb LLE Restrictions Weight Bearing Restrictions Per Provider Order: Yes LLE Weight Bearing Per Provider Order: Partial weight bearing LLE Partial Weight Bearing Percentage or Pounds: 50% General   Vital Signs   Pain Pain Assessment Pain Scale: 0-10 Pain Score: 6  (w/  activity, 0/10 w/o.) Pain Type: Surgical pain;Acute pain Pain Location: Hip Pain  Orientation: Left Pain Descriptors / Indicators: [employer] Home Living/Prior Functioning Home Living Family/patient expects to be discharged to:: Private residence Living Arrangements: Spouse/significant other Available Help at Discharge: Family Type of Home: House Home Access: Stairs to enter Secretary/administrator of Steps: 3 Entrance Stairs-Rails: Right Home Layout: One level Bathroom Shower/Tub: Psychologist, counselling, Engineer, Manufacturing Systems: Standard Bathroom Accessibility: Yes Additional Comments: hand rail and seat  Lives With: Spouse IADL History Homemaking Responsibilities: Yes Meal Prep Responsibility: Secondary Laundry Responsibility: Secondary Cleaning Responsibility: Secondary Shopping Responsibility: Secondary Child Care Responsibility: Secondary Current License: Yes Mode of Transportation: Car Occupation: Part time employment Type of Occupation: owns a business Prior Function Level of Independence: Independent with transfers, Independent with gait  Able to Take Stairs?: Yes Driving: Yes Vocation: Part time employment Vision Patient Visual Report: No change from baseline Perception    Praxis Praxis: WFL Cognition Cognition Overall Cognitive Status: Within Functional Limits for tasks assessed Arousal/Alertness: Awake/alert Memory: Appears intact Awareness: Appears intact Problem Solving: Appears intact Safety/Judgment: Appears intact Brief Interview for Mental Status (BIMS) Repetition of Three Words (First Attempt): 3 Temporal Orientation: Year: Correct Temporal Orientation: Month: Accurate within 5 days Temporal Orientation: Day: Correct Recall: Sock: Yes, after cueing (something to wear) Recall: Blue: Yes, after cueing (a color) Recall: Bed: Yes, after cueing (a piece of furniture) BIMS Summary Score: 12 Sensation Sensation Light Touch: Appears Intact Coordination Gross Motor Movements are Fluid and Coordinated: No Heel Shin Test:  [facility] Motor  Motor Motor: Within Functional Limits  Trunk/Postural Assessment  Cervical Assessment Cervical Assessment: Exceptions to [facility] (forward head) Thoracic Assessment Thoracic Assessment: Exceptions to [facility] (rounded shoulders) Lumbar Assessment Lumbar Assessment: Exceptions to [facility] (posterior pelvic tilt.) Postural Control Postural Control: Within Functional Limits  Balance Balance Balance Assessed: Yes Dynamic Sitting Balance Dynamic Sitting - Balance Support: During functional activity;Feet supported Dynamic Sitting - Level of Assistance: 5: Stand by assistance Static Standing Balance Static Standing - Balance Support: Bilateral upper extremity supported;During functional activity Static Standing - Level of Assistance: 5: Stand by assistance Dynamic Standing Balance Dynamic Standing - Balance Support: During functional activity;Bilateral upper extremity supported Dynamic Standing - Level of Assistance: 4: Min assist Extremity/Trunk Assessment      Care Tool Care Tool Self Care Eating   Eating Assist Level: Independent with assistive device    Oral Care    Oral Care Assist Level: Supervision/Verbal cueing    Bathing   Body parts bathed by patient: Right arm;Left upper leg;Left arm;Chest;Abdomen;Front perineal area;Face;Buttocks;Right upper leg;Right lower leg;Left lower leg Body parts bathed by helper: Right lower leg;Left lower leg   Assist Level: Minimal Assistance - Patient > 75%    Upper Body Dressing(including orthotics)   What is the patient wearing?: Pull over shirt   Assist Level: Set up assist    Lower Body Dressing (excluding footwear)   What is the patient wearing?: Underwear/pull up;Pants Assist for lower body dressing: Minimal Assistance - Patient > 75%    Putting on/Taking off footwear   What is the patient wearing?: Socks Assist for footwear: Maximal Assistance - Patient 25 - 49%       Care Tool Toileting Toileting activity   Assist for  toileting: Minimal Assistance - Patient > 75%     Care Tool Bed Mobility Roll left and right activity   Roll left and right assist level: Supervision/Verbal cueing    Sit to lying activity   Sit to  lying assist level: Supervision/Verbal cueing    Lying to sitting on side of bed activity   Lying to sitting on side of bed assist level: the ability to move from lying on the back to sitting on the side of the bed with no back support.: Supervision/Verbal cueing     Care Tool Transfers Sit to stand transfer   Sit to stand assist level: Contact Guard/Touching assist    Chair/bed transfer   Chair/bed transfer assist level: Contact Guard/Touching assist     Toilet transfer   Assist Level: Minimal Assistance - Patient > 75%     Care Tool Cognition  Expression of Ideas and Wants Expression of Ideas and Wants: 3. Some difficulty - exhibits some difficulty with expressing needs and ideas (e.g, some words or finishing thoughts) or speech is not clear  Understanding Verbal and Non-Verbal Content Understanding Verbal and Non-Verbal Content: 3. Usually understands - understands most conversations, but misses some part/intent of message. Requires cues at times to understand   Memory/Recall Ability Memory/Recall Ability : Current season;Location of own room;Staff names and faces;That he or she is in a hospital/hospital unit   Refer to Care Plan for Long Term Goals  SHORT TERM GOAL WEEK 1 OT Short Term Goal 1 (Week 1): STG=LTG d/t ELOS  Recommendations for other services: None    Skilled Therapeutic Intervention  Evaluation completed (see details above) with education on OT POC and goals and individual treatment initiated with focus on functional mobility/transfers, ADL re-training,  generalized strengthening and endurance, dynamic standing balance/coordination, pt/family education and discharge planning. Pt education provided on therapy schedule and safety policy with use of chair alarm. Pt  participated in goal setting. Pt participated in modified ADL task (See above for functional performance). Patient completed bathing at sink, family education on CIR, and toileting. Patient at overall min A level and would benefit from skilled therapy for safety in home    ADL ADL Eating: Modified independent Where Assessed-Eating: Chair Grooming: Supervision/safety Where Assessed-Grooming: Sitting at sink Upper Body Bathing: Supervision/safety Where Assessed-Upper Body Bathing: Sitting at sink Lower Body Bathing: Minimal assistance Where Assessed-Lower Body Bathing: Sitting at sink Upper Body Dressing: Setup Where Assessed-Upper Body Dressing: Chair Lower Body Dressing: Moderate assistance Where Assessed-Lower Body Dressing: Chair Toileting: Minimal assistance Where Assessed-Toileting: Teacher, Adult Education: Contact guard;Moderate verbal cueing Toilet Transfer Method: Proofreader: Grab bars;Raised toilet seat Geologist, Engineering Method: Warden/ranger: Shower seat with back Mobility  Bed Mobility Bed Mobility: Rolling Right;Sit to Supine;Supine to [employer] Right: Supervision/verbal cueing Supine to Sit: Supervision/Verbal cueing (from almost fllat HOB, pt has bed w/ controls.) Sit to Supine: Supervision/Verbal cueing (from almost fllat HOB, pt has bed w/ controls.) Transfers Sit to Stand: Contact Guard/Touching assist;Minimal Assistance - Patient > 75% Stand to Sit: Contact Guard/Touching assist;Independent with assistive device   Discharge Criteria: Patient will be discharged from OT if patient refuses treatment 3 consecutive times without medical reason, if treatment goals not met, if there is a change in medical status, if patient makes no progress towards goals or if patient is discharged from hospital.  The above assessment, treatment plan, treatment alternatives and goals were  discussed and mutually agreed upon: by patient  D'mariea L Cheetara Hoge 01/29/2025, 1:29 PM

## 2025-02-26 ENCOUNTER — Ambulatory Visit (HOSPITAL_BASED_OUTPATIENT_CLINIC_OR_DEPARTMENT_OTHER): Admitting: Family
